# Patient Record
Sex: Female | Born: 1965 | Race: White | Hispanic: No | Marital: Single | Smoking: Former smoker
Health system: Southern US, Community
[De-identification: ages and names within clinical notes are randomized; demographics above are authoritative.]

## PROBLEM LIST (undated history)

## (undated) DIAGNOSIS — E559 Vitamin D deficiency, unspecified: Secondary | ICD-10-CM

## (undated) DIAGNOSIS — E669 Obesity, unspecified: Secondary | ICD-10-CM

## (undated) DIAGNOSIS — D219 Benign neoplasm of connective and other soft tissue, unspecified: Secondary | ICD-10-CM

## (undated) DIAGNOSIS — K219 Gastro-esophageal reflux disease without esophagitis: Secondary | ICD-10-CM

## (undated) DIAGNOSIS — L03115 Cellulitis of right lower limb: Secondary | ICD-10-CM

## (undated) DIAGNOSIS — M1990 Unspecified osteoarthritis, unspecified site: Secondary | ICD-10-CM

## (undated) DIAGNOSIS — T7840XA Allergy, unspecified, initial encounter: Secondary | ICD-10-CM

## (undated) DIAGNOSIS — K760 Fatty (change of) liver, not elsewhere classified: Secondary | ICD-10-CM

## (undated) DIAGNOSIS — T8859XA Other complications of anesthesia, initial encounter: Secondary | ICD-10-CM

## (undated) HISTORY — DX: Allergy, unspecified, initial encounter: T78.40XA

## (undated) HISTORY — PX: TOTAL ABDOMINAL HYSTERECTOMY: SHX209

## (undated) HISTORY — DX: Unspecified osteoarthritis, unspecified site: M19.90

## (undated) HISTORY — PX: ABDOMINAL HYSTERECTOMY: SHX81

## (undated) HISTORY — PX: NEUROPLASTY / TRANSPOSITION ULNAR NERVE AT ELBOW: SUR895

## (undated) HISTORY — DX: Cellulitis of right lower limb: L03.115

## (undated) HISTORY — DX: Fatty (change of) liver, not elsewhere classified: K76.0

## (undated) HISTORY — DX: Benign neoplasm of connective and other soft tissue, unspecified: D21.9

---
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
## 2007-08-11 ENCOUNTER — Ambulatory Visit (HOSPITAL_COMMUNITY): Admission: RE | Admit: 2007-08-11 | Discharge: 2007-08-11 | Payer: Self-pay | Admitting: Family Medicine

## 2007-08-12 ENCOUNTER — Emergency Department (HOSPITAL_COMMUNITY): Admission: EM | Admit: 2007-08-12 | Discharge: 2007-08-12 | Payer: Self-pay | Admitting: Emergency Medicine

## 2008-10-05 IMAGING — US US ABDOMEN COMPLETE
1 series · 13 of 25 positions shown · non-contrast
Comparison: none

CLINICAL DATA: Abdominal mass.  Abdominal swelling.  
ABDOMEN ULTRASOUND:
TECHNIQUE: Complete abdominal ultrasound examination was performed including evaluation of the liver, gallbladder, bile ducts, pancreas, kidneys, spleen, IVC, and abdominal aorta.
TECHNIQUE: Both transabdominal and transvaginal ultrasound examinations of the pelvis were performed including evaluation of the uterus, ovaries, adnexal regions, and pelvic cul-de-sac.

[Series 1: unknown · 0.40mm/px · 13 of 68 slices shown]
[im 1/68]
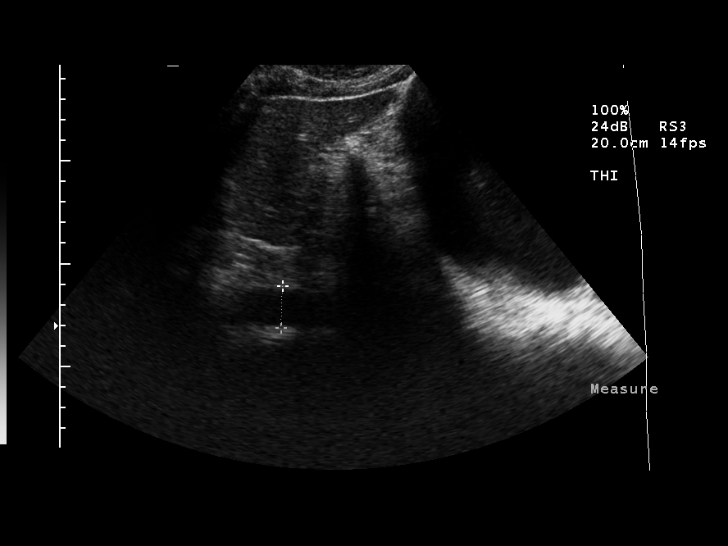
[im 6/68]
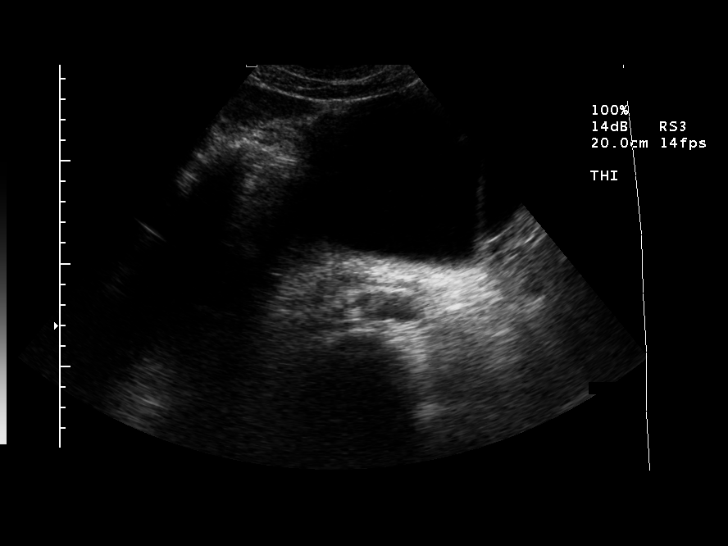
[im 12/68]
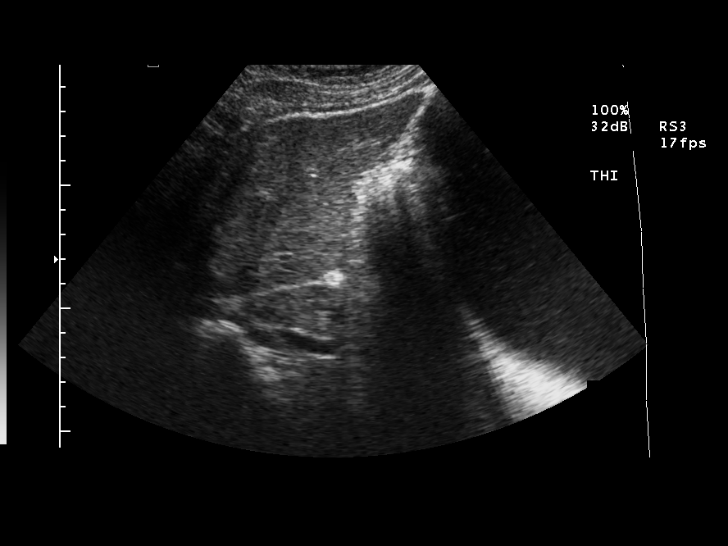
[im 17/68]
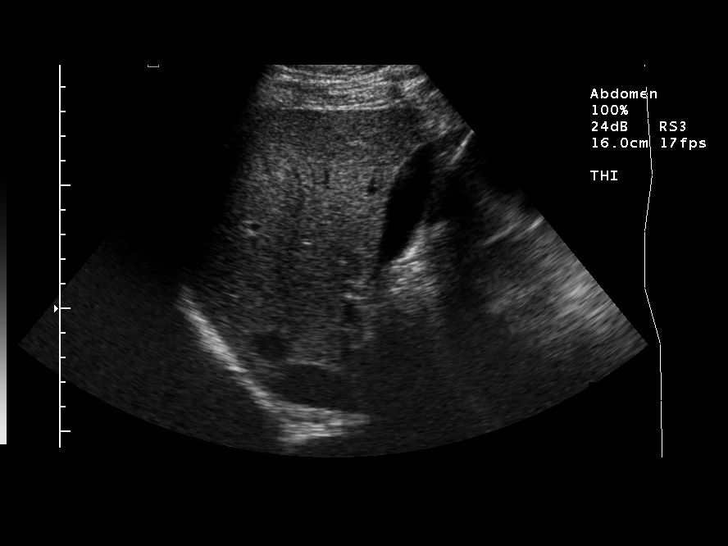
[im 23/68]
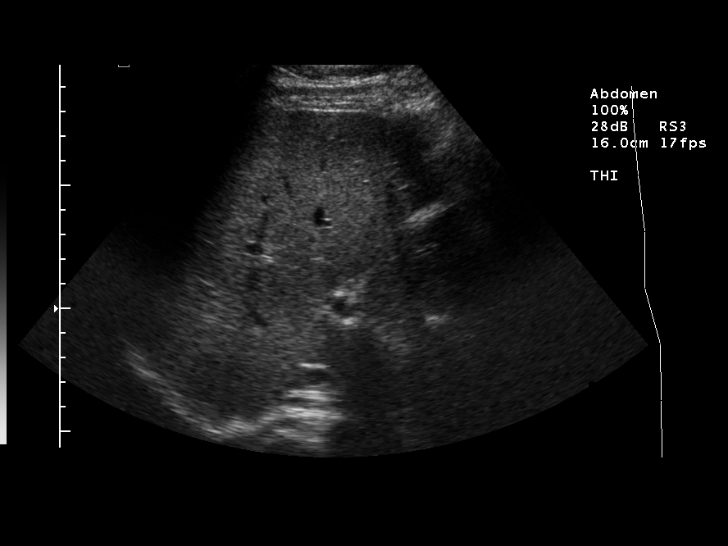
[im 28/68]
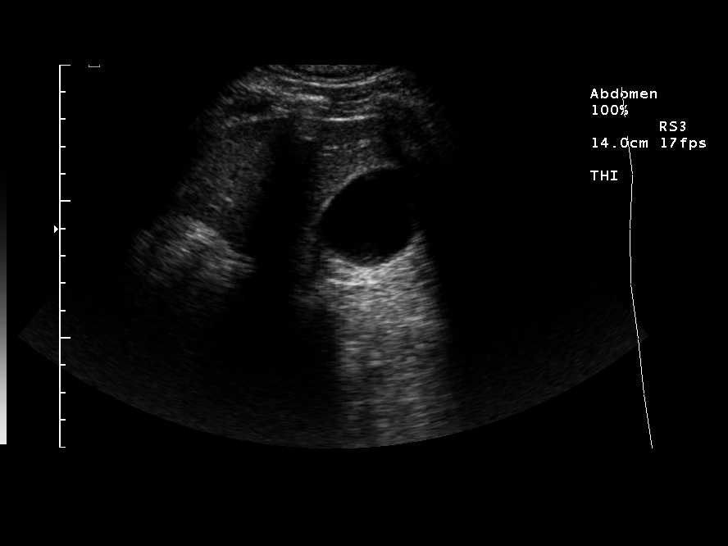
[im 34/68]
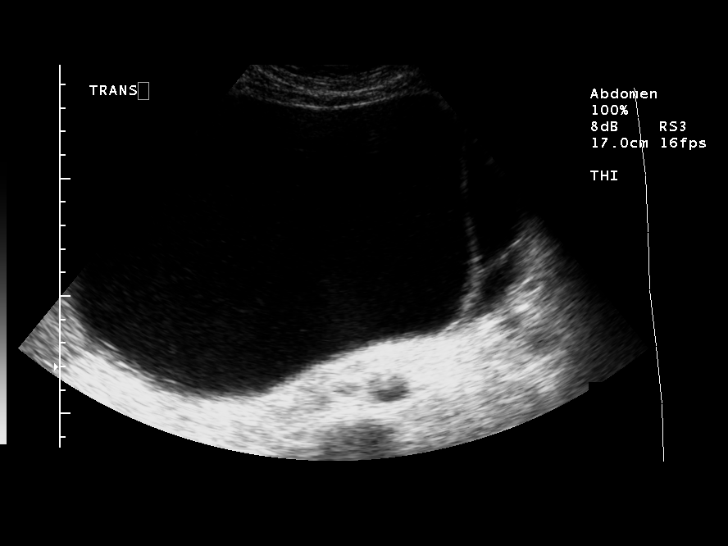
[im 40/68]
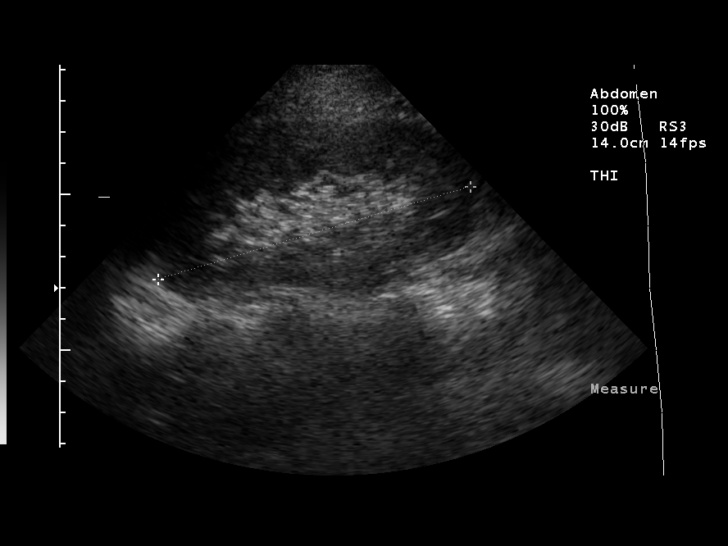
[im 45/68]
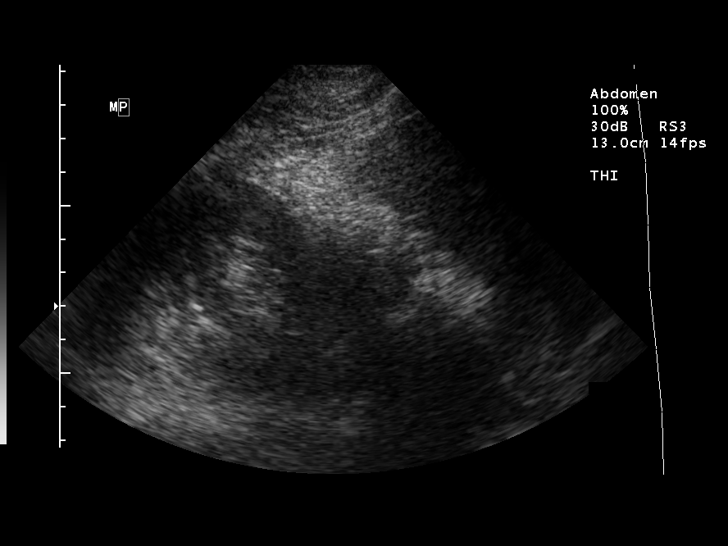
[im 51/68]
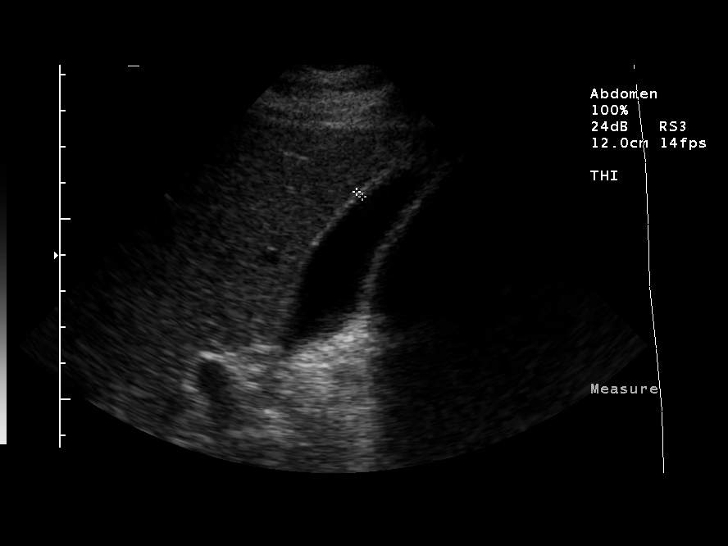
[im 56/68]
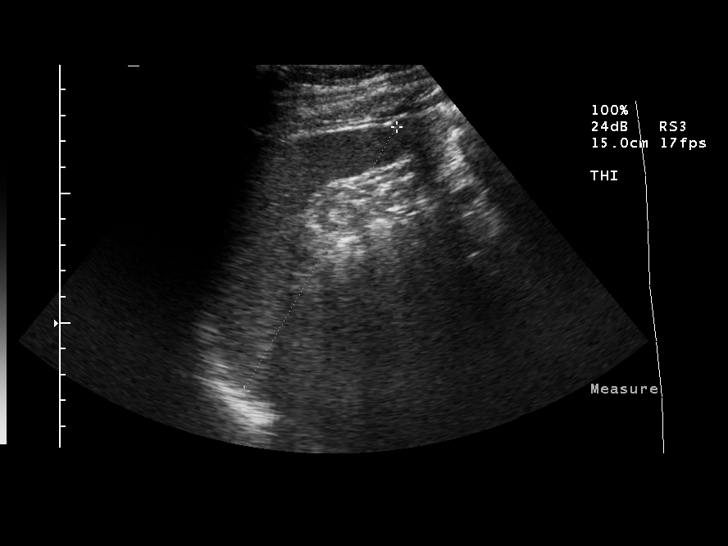
[im 62/68]
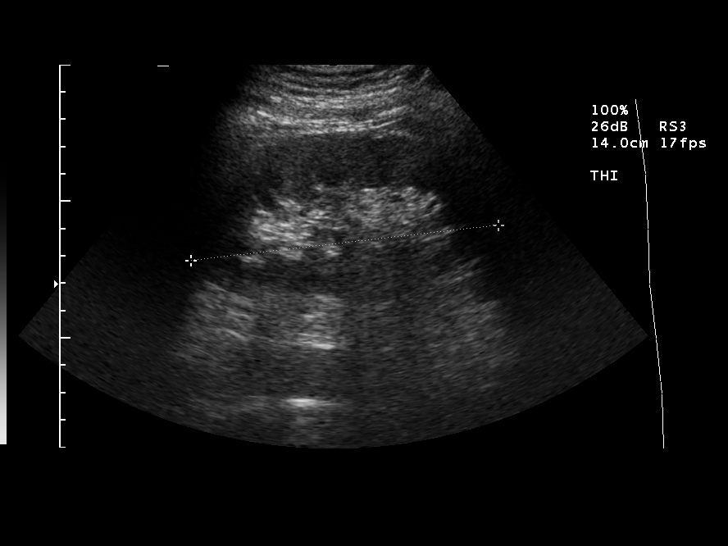
[im 68/68]
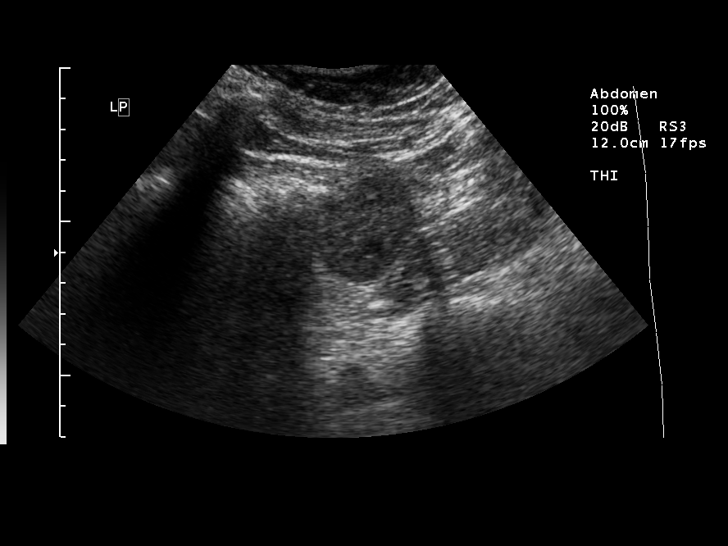

[13 of 25 positions shown; findings below may reference images not displayed]

FINDINGS: Normal gallbladder.  Gallbladder wall thickness is 2.1 mm.  No biliary ductal dilatation.  Common duct measures 3.1 mm.  No hepatic abnormality.  Pancreas could not be visualized because of adjacent bowel gas.  Patent IVC.  Abdominal aortic aneurysm maximum diameter is 2.0 cm.  Right and left kidneys measure 10.4 cm and 11.3 cm in length, respectively.  No hydronephrosis.  Spleen unremarkable.  Spleen measures 10.9 cm in length.  The superior aspect of a large abdominopelvic mass is appreciated.  It appears complex, but predominately cystic.  Septations are appreciated.  No abdominal ascites.
IMPRESSION: Large abdominopelvic mass of undetermined etiology.  Malignancy, for example, due to ovarian carcinoma cannot be excluded at this point.  The physician will be contacted concerning the desire to ultrasound the pelvis for further assessment.  
TRANSABDOMINAL AND TRANSVAGINAL PELVIC ULTRASOUND:
FINDINGS: Uterus measures 11 cm in length and 5.3 x 5.6 cm in transverse dimensions.  Endometrial stripe complex measures 1.2 cm in thickness. There are a couple of small uterine fibroids.  Left ovary measures 1.9 x 2.8 x 3.5 cm.  The right ovary cannot be identified as a separate structure.  There is a large complex appearing pelvic mass which extends up into the abdomen.  There may be some solid elements posteriorly.  Septations are appreciated.  The mass is   predominately cystic.  It measures 27 cm in superior to inferior dimension and approximately 13 cm x 25 cm in transverse dimensions.  No free pelvic ascites is detected.
IMPRESSION: Large complex pelvic mass extending superiorly up into the abdomen approximately to the level of the liver and pancreas.  I feel that a right ovarian mass would be a leading consideration, potentially ovarian cystadenocarcinoma or large cystadenoma.

## 2009-07-08 ENCOUNTER — Emergency Department (HOSPITAL_COMMUNITY): Admission: EM | Admit: 2009-07-08 | Discharge: 2009-07-08 | Payer: Self-pay | Admitting: Family Medicine

## 2009-12-14 ENCOUNTER — Emergency Department (HOSPITAL_COMMUNITY): Admission: EM | Admit: 2009-12-14 | Discharge: 2009-12-14 | Payer: Self-pay | Admitting: Family Medicine

## 2009-12-15 ENCOUNTER — Ambulatory Visit: Payer: Self-pay | Admitting: Internal Medicine

## 2009-12-15 ENCOUNTER — Inpatient Hospital Stay (HOSPITAL_COMMUNITY): Admission: EM | Admit: 2009-12-15 | Discharge: 2009-12-19 | Payer: Self-pay | Admitting: Emergency Medicine

## 2010-01-03 ENCOUNTER — Ambulatory Visit: Payer: Self-pay | Admitting: Internal Medicine

## 2010-01-03 ENCOUNTER — Encounter (HOSPITAL_BASED_OUTPATIENT_CLINIC_OR_DEPARTMENT_OTHER): Admission: RE | Admit: 2010-01-03 | Discharge: 2010-04-03 | Payer: Self-pay | Admitting: General Surgery

## 2010-01-03 DIAGNOSIS — L02419 Cutaneous abscess of limb, unspecified: Secondary | ICD-10-CM | POA: Insufficient documentation

## 2010-01-03 DIAGNOSIS — D259 Leiomyoma of uterus, unspecified: Secondary | ICD-10-CM | POA: Insufficient documentation

## 2010-01-03 DIAGNOSIS — L03119 Cellulitis of unspecified part of limb: Secondary | ICD-10-CM

## 2010-01-04 ENCOUNTER — Encounter: Payer: Self-pay | Admitting: Internal Medicine

## 2010-01-05 ENCOUNTER — Ambulatory Visit (HOSPITAL_COMMUNITY): Admission: RE | Admit: 2010-01-05 | Discharge: 2010-01-05 | Payer: Self-pay | Admitting: Internal Medicine

## 2010-01-05 ENCOUNTER — Ambulatory Visit: Payer: Self-pay | Admitting: Vascular Surgery

## 2010-01-05 ENCOUNTER — Encounter: Payer: Self-pay | Admitting: Internal Medicine

## 2010-07-31 ENCOUNTER — Emergency Department (HOSPITAL_COMMUNITY)
Admission: EM | Admit: 2010-07-31 | Discharge: 2010-07-31 | Payer: Self-pay | Source: Home / Self Care | Admitting: Family Medicine

## 2010-12-12 NOTE — Miscellaneous (Signed)
Summary: HIPAA Restrictions  HIPAA Restrictions   Imported By: Florinda Marker 01/04/2010 15:33:57  _____________________________________________________________________  External Attachment:    Type:   Image     Comment:   External Document

## 2010-12-12 NOTE — Assessment & Plan Note (Signed)
Summary: NEW HFU-PER DR [facility]ILLS/[facility]   Vital Signs:  Patient profile:   45 year old female Height:      65 inches (165.10 cm) Weight:      227.3 pounds (103.32 kg) BMI:     37.96 Temp:     98.3 degrees F Pulse rate:   69 / minute BP sitting:   115 / 73  (right arm)  Vitals Entered By: Dorie Rank RN (January 03, 2010 2:49 PM) CC: HFU - right lower leg cracked, reddened, scabbed, - states was told to finish antibiotic and just wash leg twice a day with soap and water - skin very dry and pt states right ankle swollen - states "leg feels tight" Is Patient Diabetic? No Pain Assessment Patient in pain? yes     Location: right lower leg Intensity: 0 - sitting Type: burning Onset of pain  standing and walking increases burning sensation - Feb. 1, started fevers Nutritional Status BMI of > 30 = obese  Have you ever been in a relationship where you felt threatened, hurt or afraid?Unable to ask  Domestic Violence Intervention friend  in room  Does patient need assistance? Functional Status Self care Ambulation Normal Comments walking with steady gait but slightly favoring right leg - finished antibiotic on Thursday   CC:  HFU - right lower leg cracked, reddened, scabbed, and - states was told to finish antibiotic and just wash leg twice a day with soap and water - skin very dry and pt states right ankle swollen - states "leg feels tight".  History of Present Illness: 45 yo f with no significant PMH presented to [facility] ED on 12/14/2009 with fever and LE cellulitis, she was admitted and given IV vanc. for 5 days and was sent home on with script for 10days of ds bactrim, today on f/u she does not complain of any fever or chills, however she does complain that her R leg is still swolen and that the wound is crusting over and falling off, today her R.leg is red, swolen and with diffuse patchy black crusted areas.   Preventive Screening-Counseling & Management  Alcohol-Tobacco     Smoking  Status: quit     Year Quit: 2008  Current Medications (verified): 1)  Estradiol 0.1 Mg/24hr Ptwk (Estradiol) .... Apply To Skin Weekly  Social History: Smoking Status:  quit  Review of Systems       per HPI  Physical Exam  General:  alert, well-developed, and cooperative to examination.    Lungs:  normal respiratory effort, no accessory muscle use, normal breath sounds, no crackles, and no wheezes.  Heart:  normal rate, regular rhythm, no murmur, no gallop, and no rub.    Abdomen:  soft, non-tender, normal bowel sounds, no distention, no guarding, no rebound tenderness, no hepatomegaly, and no splenomegaly.    Msk:  no joint swelling, no joint warmth, and no redness over joints.    Extremities:  R leg residual cellulitis, with black patchy crusting and erythema.  Neurologic:  alert & oriented X3, cranial nerves II-XII intact, strength normal in all extremities, sensation intact to light touch, and gait normal.       Impression & Recommendations:  Problem # 1:  CELLULITIS, LEG, RIGHT (ICD-682.6) remains not fully healed despite adequate antibiotic treatment, since patient is afebrile and completed abx course, there is no need for further abx..A referral to wound care made for further recs. and will obtain LE dopplers to assess for blood flow to region.Marland Kitchen  Orders: Wound Care Center Referral (Wound Care) LE Arterial Doppler/ABI (LE arterial doppler) LE Venous Duplex (DVT) (DVT)  Problem # 2:  FIBROIDS, UTERUS (ICD-218.9) per OBGYN, patient is s/p hysterectomy on estradiol patch  Complete Medication List: 1)  Estradiol 0.1 Mg/24hr Ptwk (Estradiol) .... Apply to skin weekly  Patient Instructions: 1)  Please schedule a follow-up appointment in 1 month. Prescriptions: ESTRADIOL 0.1 MG/24HR PTWK (ESTRADIOL) apply to skin weekly  #4 x 0   Entered and Authorized by:   Darnelle Maffucci MD   Signed by:   Darnelle Maffucci MD on 01/03/2010   Method used:   Print then Give to Patient    RxID:   [id number]

## 2011-01-31 LAB — BASIC METABOLIC PANEL
BUN: 6 mg/dL (ref 6–23)
BUN: 9 mg/dL (ref 6–23)
CO2: 27 mEq/L (ref 19–32)
Calcium: 8.1 mg/dL — ABNORMAL LOW (ref 8.4–10.5)
Calcium: 8.4 mg/dL (ref 8.4–10.5)
Chloride: 104 mEq/L (ref 96–112)
Chloride: 106 mEq/L (ref 96–112)
Creatinine, Ser: 0.7 mg/dL (ref 0.4–1.2)
Creatinine, Ser: 0.72 mg/dL (ref 0.4–1.2)
Creatinine, Ser: 0.78 mg/dL (ref 0.4–1.2)
GFR calc Af Amer: >60 mL/min (ref >60)
GFR calc Af Amer: >60 mL/min (ref >60)
GFR calc non Af Amer: >60 mL/min (ref >60)
Potassium: 3.3 mEq/L — ABNORMAL LOW (ref 3.5–5.1)

## 2011-01-31 LAB — COMPREHENSIVE METABOLIC PANEL
BUN: 10 mg/dL (ref 6–23)
CO2: 22 mEq/L (ref 19–32)
Calcium: 7.7 mg/dL — ABNORMAL LOW (ref 8.4–10.5)
Creatinine, Ser: 0.8 mg/dL (ref 0.4–1.2)
GFR calc non Af Amer: >60 mL/min (ref >60)
Glucose, Bld: 133 mg/dL — ABNORMAL HIGH (ref 70–99)
Sodium: 133 mEq/L — ABNORMAL LOW (ref 135–145)
Total Protein: 6.5 g/dL (ref 6.0–8.3)

## 2011-01-31 LAB — CULTURE, BLOOD (ROUTINE X 2)
Culture: NO GROWTH
Culture: NO GROWTH

## 2011-01-31 LAB — DIFFERENTIAL
Basophils Absolute: 0 10*3/uL (ref 0.0–0.1)
Basophils Absolute: 0.1 10*3/uL (ref 0.0–0.1)
Basophils Relative: 0 % (ref 0–1)
Basophils Relative: 1 % (ref 0–1)
Eosinophils Relative: 0 % (ref 0–5)
Eosinophils Relative: 0 % (ref 0–5)
Lymphocytes Relative: 5 % — ABNORMAL LOW (ref 12–46)
Lymphocytes Relative: 7 % — ABNORMAL LOW (ref 12–46)
Lymphs Abs: 1 10*3/uL (ref 0.7–4.0)
Monocytes Absolute: 0.2 10*3/uL (ref 0.1–1.0)
Monocytes Absolute: 0.7 10*3/uL (ref 0.1–1.0)
Monocytes Relative: 1 % — ABNORMAL LOW (ref 3–12)
Neutrophils Relative %: 92 % — ABNORMAL HIGH (ref 43–77)
Neutrophils Relative %: 93 % — ABNORMAL HIGH (ref 43–77)
WBC Morphology: INCREASED

## 2011-01-31 LAB — CBC
HCT: 33.2 % — ABNORMAL LOW (ref 36.0–46.0)
HCT: 34.7 % — ABNORMAL LOW (ref 36.0–46.0)
Hemoglobin: 11.8 g/dL — ABNORMAL LOW (ref 12.0–15.0)
Hemoglobin: 12.1 g/dL (ref 12.0–15.0)
Hemoglobin: 12.3 g/dL (ref 12.0–15.0)
MCHC: 34.5 g/dL (ref 30.0–36.0)
MCHC: 34.8 g/dL (ref 30.0–36.0)
MCHC: 35 g/dL (ref 30.0–36.0)
MCHC: 35.5 g/dL (ref 30.0–36.0)
MCV: 94.6 fL (ref 78.0–100.0)
MCV: 95.1 fL (ref 78.0–100.0)
MCV: 96.6 fL (ref 78.0–100.0)
Platelets: 155 10*3/uL (ref 150–400)
Platelets: 180 10*3/uL (ref 150–400)
Platelets: 180 10*3/uL (ref 150–400)
Platelets: 203 10*3/uL (ref 150–400)
RBC: 3.51 MIL/uL — ABNORMAL LOW (ref 3.87–5.11)
RBC: 3.54 MIL/uL — ABNORMAL LOW (ref 3.87–5.11)
RBC: 3.7 MIL/uL — ABNORMAL LOW (ref 3.87–5.11)
RDW: 12.8 % (ref 11.5–15.5)
RDW: 13.1 % (ref 11.5–15.5)
RDW: 13.1 % (ref 11.5–15.5)
RDW: 13.2 % (ref 11.5–15.5)
WBC: 10.9 10*3/uL — ABNORMAL HIGH (ref 4.0–10.5)
WBC: 10.9 10*3/uL — ABNORMAL HIGH (ref 4.0–10.5)
WBC: 16.9 10*3/uL — ABNORMAL HIGH (ref 4.0–10.5)
WBC: 21.5 10*3/uL — ABNORMAL HIGH (ref 4.0–10.5)

## 2011-01-31 LAB — RAPID URINE DRUG SCREEN, HOSP PERFORMED
Amphetamines: NOT DETECTED
Barbiturates: NOT DETECTED
Benzodiazepines: NOT DETECTED
Opiates: NOT DETECTED

## 2011-01-31 LAB — URINALYSIS, ROUTINE W REFLEX MICROSCOPIC
Bilirubin Urine: NEGATIVE
Glucose, UA: NEGATIVE mg/dL
Protein, ur: NEGATIVE mg/dL
Specific Gravity, Urine: 1.007 (ref 1.005–1.030)
Urobilinogen, UA: 1 mg/dL (ref 0.0–1.0)

## 2011-01-31 LAB — WOUND CULTURE
Culture: NO GROWTH
Gram Stain: NONE SEEN

## 2011-01-31 LAB — STREP PNEUMONIAE URINARY ANTIGEN: Strep Pneumo Urinary Antigen: NEGATIVE

## 2011-01-31 LAB — HEMOGLOBIN A1C
Hgb A1c MFr Bld: 5.5 % (ref 4.6–6.1)
Mean Plasma Glucose: 111 mg/dL

## 2011-01-31 LAB — URINE MICROSCOPIC-ADD ON

## 2011-02-08 ENCOUNTER — Encounter: Payer: Self-pay | Admitting: Internal Medicine

## 2011-08-23 LAB — COMPREHENSIVE METABOLIC PANEL
AST: 23
Albumin: 4.3
Alkaline Phosphatase: 43
BUN: 3 — ABNORMAL LOW
CO2: 27
Chloride: 106
GFR calc non Af Amer: >60
Potassium: 3.5
Total Bilirubin: 0.8

## 2011-08-23 LAB — URINALYSIS, ROUTINE W REFLEX MICROSCOPIC
Bilirubin Urine: NEGATIVE
Glucose, UA: NEGATIVE
Ketones, ur: 15 — AB
Leukocytes, UA: NEGATIVE
Protein, ur: NEGATIVE
pH: 6.5

## 2011-08-23 LAB — DIFFERENTIAL
Basophils Absolute: 0
Basophils Relative: 0
Eosinophils Relative: 1
Monocytes Absolute: 0.5
Neutro Abs: 5.1

## 2011-08-23 LAB — CBC
HCT: 40.7
MCV: 94.8
Platelets: 261
RBC: 4.29
WBC: 7.8

## 2011-08-23 LAB — URINE MICROSCOPIC-ADD ON

## 2011-08-23 LAB — PREGNANCY, URINE: Preg Test, Ur: NEGATIVE

## 2012-12-27 ENCOUNTER — Other Ambulatory Visit: Payer: Self-pay

## 2013-07-27 ENCOUNTER — Other Ambulatory Visit: Payer: Self-pay | Admitting: Obstetrics and Gynecology

## 2013-07-27 DIAGNOSIS — R928 Other abnormal and inconclusive findings on diagnostic imaging of breast: Secondary | ICD-10-CM

## 2013-08-11 ENCOUNTER — Ambulatory Visit
Admission: RE | Admit: 2013-08-11 | Discharge: 2013-08-11 | Disposition: A | Discharge status: Home / Self Care | Payer: 59 | Source: Ambulatory Visit | Attending: Obstetrics and Gynecology | Admitting: Obstetrics and Gynecology

## 2013-08-11 DIAGNOSIS — R928 Other abnormal and inconclusive findings on diagnostic imaging of breast: Secondary | ICD-10-CM

## 2013-09-17 ENCOUNTER — Other Ambulatory Visit: Payer: Self-pay

## 2014-04-16 ENCOUNTER — Other Ambulatory Visit: Payer: Self-pay

## 2014-04-16 DIAGNOSIS — Z1231 Encounter for screening mammogram for malignant neoplasm of breast: Secondary | ICD-10-CM

## 2014-07-27 ENCOUNTER — Ambulatory Visit: Payer: 59

## 2014-08-05 ENCOUNTER — Ambulatory Visit
Admission: RE | Admit: 2014-08-05 | Discharge: 2014-08-05 | Disposition: A | Discharge status: Home / Self Care | Payer: 59 | Source: Ambulatory Visit

## 2014-08-05 DIAGNOSIS — Z1231 Encounter for screening mammogram for malignant neoplasm of breast: Secondary | ICD-10-CM

## 2014-09-30 ENCOUNTER — Emergency Department (HOSPITAL_COMMUNITY)
Admission: EM | Admit: 2014-09-30 | Discharge: 2014-09-30 | Disposition: A | Discharge status: Home / Self Care | Payer: 59 | Source: Home / Self Care | Attending: Family Medicine | Admitting: Family Medicine

## 2014-09-30 ENCOUNTER — Encounter (HOSPITAL_COMMUNITY): Payer: Self-pay | Admitting: *Deleted

## 2014-09-30 DIAGNOSIS — M25561 Pain in right knee: Secondary | ICD-10-CM

## 2014-09-30 DIAGNOSIS — M1711 Unilateral primary osteoarthritis, right knee: Secondary | ICD-10-CM

## 2014-09-30 DIAGNOSIS — M129 Arthropathy, unspecified: Secondary | ICD-10-CM

## 2014-09-30 MED ORDER — METHYLPREDNISOLONE ACETATE 80 MG/ML IJ SUSP
INTRAMUSCULAR | Status: AC
  Filled 2014-09-30: qty 1

## 2014-09-30 MED ORDER — KETOROLAC TROMETHAMINE 60 MG/2ML IM SOLN
60.0000 mg | Freq: Once | INTRAMUSCULAR | Status: AC
  Administered 2014-09-30: 60 mg via INTRAMUSCULAR

## 2014-09-30 MED ORDER — KETOROLAC TROMETHAMINE 60 MG/2ML IM SOLN
INTRAMUSCULAR | Status: AC
  Filled 2014-09-30: qty 2

## 2014-09-30 MED ORDER — METHYLPREDNISOLONE ACETATE PF 80 MG/ML IJ SUSP
80.0000 mg | Freq: Once | INTRAMUSCULAR | Status: AC
  Administered 2014-09-30: 80 mg via INTRAMUSCULAR

## 2014-09-30 MED ORDER — MELOXICAM 15 MG PO TABS: 15.0000 mg | ORAL_TABLET | Freq: Every day | ORAL | Status: DC

## 2014-09-30 NOTE — ED Provider Notes (Signed)
CSN: [id number]     Arrival date & time 09/30/14  1206 History   First MD Initiated Contact with Patient 09/30/14 1245     Chief Complaint  Patient presents with  . Knee Pain   (Consider location/radiation/quality/duration/timing/severity/associated sxs/prior Treatment) HPI         48 year old female presents for evaluation of right knee pain. This pain initially began about one month ago, the lateral portion of the knee. She denies any specific injury but says it started hurting more she was doing training at work on a hard concrete floor. Since then the pain has moved to the medial portion of the knee. The pain is dull, throbbing. It is nonradiating. She feels like there is some mild swelling in the front of the knee. She denies any previous history of knee pain or arthritis. She states that she feels like there is inflammation in her knee and she just needs a brace. She has been taking ibuprofen as needed for this pain which is helpful  Past Medical History  Diagnosis Date  . Fibroids     uterine  . Cellulitis of leg, right    Past Surgical History  Procedure Laterality Date  . Abdominal hysterectomy     History reviewed. No pertinent family history. History  Substance Use Topics  . Smoking status: Former Smoker    Types: Cigarettes    Quit date: 11/12/2006  . Smokeless tobacco: Not on file  . Alcohol Use: No   OB History    No data available     Review of Systems  Musculoskeletal: Positive for arthralgias (knee pain).  All other systems reviewed and are negative.   Allergies  Review of patient's allergies indicates no known allergies.  Home Medications   Prior to Admission medications   Medication Sig Start Date End Date Taking? Authorizing Provider  estradiol (CLIMARA - DOSED IN MG/24 HR) 0.1 mg/24hr Place 1 patch onto the skin once a week.      Historical Provider, MD  meloxicam (MOBIC) 15 MG tablet Take 1 tablet (15 mg total) by mouth daily. 09/30/14   Liam Graham, PA-C   BP 112/73 mmHg  Pulse 76  Temp(Src) 98.5 F (36.9 C) (Oral)  Resp 16  SpO2 98% Physical Exam  Constitutional: She is oriented to person, place, and time. Vital signs are normal. She appears well-developed and well-nourished. No distress.  HENT:  Head: Normocephalic and atraumatic.  Pulmonary/Chest: Effort normal. No respiratory distress.  Musculoskeletal:       Right knee: She exhibits normal range of motion, no swelling, no effusion, no deformity, normal alignment, no LCL laxity, normal patellar mobility, no bony tenderness, normal meniscus and no MCL laxity. Tenderness found. Medial joint line tenderness noted. No lateral joint line, no MCL, no LCL and no patellar tendon tenderness noted.  Neurological: She is alert and oriented to person, place, and time. She has normal strength and normal reflexes. No sensory deficit. She exhibits normal muscle tone. Coordination and gait normal. GCS eye subscore is 4. GCS verbal subscore is 5. GCS motor subscore is 6.  Skin: Skin is warm and dry. No rash noted. She is not diaphoretic.  Psychiatric: She has a normal mood and affect. Judgment normal.  Nursing note and vitals reviewed.   ED Course  Procedures (including critical care time) Labs Review Labs Reviewed - No data to display  Imaging Review No results found.   MDM   1. Knee pain, right   2. Arthritis  of knee, right    Given toradol and depomedrol here.  Discharge with daily mobic.  Follow up with ortho if no improvement.     Meds ordered this encounter  Medications  . methylPREDNISolone acetate PF (DEPO-MEDROL) injection 80 mg    Sig:   . ketorolac (TORADOL) injection 60 mg    Sig:   . meloxicam (MOBIC) 15 MG tablet    Sig: Take 1 tablet (15 mg total) by mouth daily.    Dispense:  30 tablet    Refill:  2    Order Specific Question:  Supervising Provider    Answer:  Ihor Gully D [place] Tahirah Sara, PA-C 09/30/14 1314

## 2014-09-30 NOTE — Discharge Instructions (Signed)
Arthritis, Nonspecific °Arthritis is pain, redness, warmth, or puffiness (inflammation) of a joint. The joint may be stiff or hurt when you move it. One or more joints may be affected. There are many types of arthritis. Your doctor may not know what type you have right away. The most common cause of arthritis is wear and tear on the joint (osteoarthritis). °HOME CARE  °· Only take medicine as told by your doctor. °· Rest the joint as much as possible. °· Raise (elevate) your joint if it is puffy. °· Use crutches if the painful joint is in your leg. °· Drink enough fluids to keep your pee (urine) clear or pale yellow. °· Follow your doctor's diet instructions. °· Use cold packs for very bad joint pain for 10 to 15 minutes every hour. Ask your doctor if it is okay for you to use hot packs. °· Exercise as told by your doctor. °· Take a warm shower if you have stiffness in the morning. °· Move your sore joints throughout the day. °GET HELP RIGHT AWAY IF:  °· You have a fever. °· You have very bad joint pain, puffiness, or redness. °· You have many joints that are painful and puffy. °· You are not getting better with treatment. °· You have very bad back pain or leg weakness. °· You cannot control when you poop (bowel movement) or pee (urinate). °· You do not feel better in 24 hours or are getting worse. °· You are having side effects from your medicine. °MAKE SURE YOU:  °· Understand these instructions. °· Will watch your condition. °· Will get help right away if you are not doing well or get worse. °Document Released: 01/23/2010 Document Revised: 04/29/2012 Document Reviewed: 01/23/2010 °ExitCare® Patient Information ©2015 [employer]. This information is not intended to replace advice given to you by your health care provider. Make sure you discuss any questions you have with your health care provider. ° °Knee Pain °The knee is the complex joint between your thigh and your lower leg. It is made up of bones, tendons,  ligaments, and cartilage. The bones that make up the knee are: °· The femur in the thigh. °· The tibia and fibula in the lower leg. °· The patella or kneecap riding in the groove on the lower femur. °CAUSES  °Knee pain is a common complaint with many causes. A few of these causes are: °· Injury, such as: °¨ A ruptured ligament or tendon injury. °¨ Torn cartilage. °· Medical conditions, such as: °¨ Gout °¨ Arthritis °¨ Infections °· Overuse, over training, or overdoing a physical activity. °Knee pain can be minor or severe. Knee pain can accompany debilitating injury. Minor knee problems often respond well to self-care measures or get well on their own. More serious injuries may need medical intervention or even surgery. °SYMPTOMS °The knee is complex. Symptoms of knee problems can vary widely. Some of the problems are: °· Pain with movement and weight bearing. °· Swelling and tenderness. °· Buckling of the knee. °· Inability to straighten or extend your knee. °· Your knee locks and you cannot straighten it. °· Warmth and redness with pain and fever. °· Deformity or dislocation of the kneecap. °DIAGNOSIS  °Determining what is wrong may be very straight forward such as when there is an injury. It can also be challenging because of the complexity of the knee. Tests to make a diagnosis may include: °· Your caregiver taking a history and doing a physical exam. °· Routine X-rays   can be used to rule out other problems. X-rays will not reveal a cartilage tear. Some injuries of the knee can be diagnosed by: °¨ Arthroscopy a surgical technique by which a small video camera is inserted through tiny incisions on the sides of the knee. This procedure is used to examine and repair internal knee joint problems. Tiny instruments can be used during arthroscopy to repair the torn knee cartilage (meniscus). °¨ Arthrography is a radiology technique. A contrast liquid is directly injected into the knee joint. Internal structures of the  knee joint then become visible on X-ray film. °¨ An MRI scan is a non X-ray radiology procedure in which magnetic fields and a computer produce two- or three-dimensional images of the inside of the knee. Cartilage tears are often visible using an MRI scanner. MRI scans have largely replaced arthrography in diagnosing cartilage tears of the knee. °· Blood work. °· Examination of the fluid that helps to lubricate the knee joint (synovial fluid). This is done by taking a sample out using a needle and a syringe. °TREATMENT °The treatment of knee problems depends on the cause. Some of these treatments are: °· Depending on the injury, proper casting, splinting, surgery, or physical therapy care will be needed. °· Give yourself adequate recovery time. Do not overuse your joints. If you begin to get sore during workout routines, back off. Slow down or do fewer repetitions. °· For repetitive activities such as cycling or running, maintain your strength and nutrition. °· Alternate muscle groups. For example, if you are a weight lifter, work the upper body on one day and the lower body the next. °· Either tight or weak muscles do not give the proper support for your knee. Tight or weak muscles do not absorb the stress placed on the knee joint. Keep the muscles surrounding the knee strong. °· Take care of mechanical problems. °¨ If you have flat feet, orthotics or special shoes may help. See your caregiver if you need help. °¨ Arch supports, sometimes with wedges on the inner or outer aspect of the heel, can help. These can shift pressure away from the side of the knee most bothered by osteoarthritis. °¨ A brace called an "unloader" brace also may be used to help ease the pressure on the most arthritic side of the knee. °· If your caregiver has prescribed crutches, braces, wraps or ice, use as directed. The acronym for this is PRICE. This means protection, rest, ice, compression, and elevation. °· Nonsteroidal anti-inflammatory  drugs (NSAIDs), can help relieve pain. But if taken immediately after an injury, they may actually increase swelling. Take NSAIDs with food in your stomach. Stop them if you develop stomach problems. Do not take these if you have a history of ulcers, stomach pain, or bleeding from the bowel. Do not take without your caregiver's approval if you have problems with fluid retention, heart failure, or kidney problems. °· For ongoing knee problems, physical therapy may be helpful. °· Glucosamine and chondroitin are over-the-counter dietary supplements. Both may help relieve the pain of osteoarthritis in the knee. These medicines are different from the usual anti-inflammatory drugs. Glucosamine may decrease the rate of cartilage destruction. °· Injections of a corticosteroid drug into your knee joint may help reduce the symptoms of an arthritis flare-up. They may provide pain relief that lasts a few months. You may have to wait a few months between injections. The injections do have a small increased risk of infection, water retention, and elevated blood sugar levels. °·   Hyaluronic acid injected into damaged joints may ease pain and provide lubrication. These injections may work by reducing inflammation. A series of shots may give relief for as long as 6 months. °· Topical painkillers. Applying certain ointments to your skin may help relieve the pain and stiffness of osteoarthritis. Ask your pharmacist for suggestions. Many over the-counter products are approved for temporary relief of arthritis pain. °· In some countries, doctors often prescribe topical NSAIDs for relief of chronic conditions such as arthritis and tendinitis. A review of treatment with NSAID creams found that they worked as well as oral medications but without the serious side effects. °PREVENTION °· Maintain a healthy weight. Extra pounds put more strain on your joints. °· Get strong, stay limber. Weak muscles are a common cause of knee injuries.  Stretching is important. Include flexibility exercises in your workouts. °· Be smart about exercise. If you have osteoarthritis, chronic knee pain or recurring injuries, you may need to change the way you exercise. This does not mean you have to stop being active. If your knees ache after jogging or playing basketball, consider switching to swimming, water aerobics, or other low-impact activities, at least for a few days a week. Sometimes limiting high-impact activities will provide relief. °· Make sure your shoes fit well. Choose footwear that is right for your sport. °· Protect your knees. Use the proper gear for knee-sensitive activities. Use kneepads when playing volleyball or laying carpet. Buckle your seat belt every time you drive. Most shattered kneecaps occur in car accidents. °· Rest when you are tired. °SEEK MEDICAL CARE IF:  °You have knee pain that is continual and does not seem to be getting better.  °SEEK IMMEDIATE MEDICAL CARE IF:  °Your knee joint feels hot to the touch and you have a high fever. °MAKE SURE YOU:  °· Understand these instructions. °· Will watch your condition. °· Will get help right away if you are not doing well or get worse. °Document Released: 08/26/2007 Document Revised: 01/21/2012 Document Reviewed: 08/26/2007 °ExitCare® Patient Information ©2015 [employer]. This information is not intended to replace advice given to you by your health care provider. Make sure you discuss any questions you have with your health care provider. ° °

## 2014-09-30 NOTE — ED Notes (Signed)
Pt  Reports   She may  Have  Injured  Her      r knee  About  1 month   Ago   While doing        Practice   excercises       At  Work      She ambulated  To exam  Room      Appearing       In  No  Acute  Distress

## 2015-07-22 ENCOUNTER — Other Ambulatory Visit: Payer: Self-pay

## 2015-07-22 DIAGNOSIS — Z1231 Encounter for screening mammogram for malignant neoplasm of breast: Secondary | ICD-10-CM

## 2015-08-19 ENCOUNTER — Ambulatory Visit: Payer: 59

## 2015-09-14 ENCOUNTER — Ambulatory Visit
Admission: RE | Admit: 2015-09-14 | Discharge: 2015-09-14 | Disposition: A | Discharge status: Home / Self Care | Payer: 59 | Source: Ambulatory Visit

## 2015-09-14 DIAGNOSIS — Z1231 Encounter for screening mammogram for malignant neoplasm of breast: Secondary | ICD-10-CM

## 2015-09-23 ENCOUNTER — Telehealth: Payer: 59 | Admitting: Nurse Practitioner

## 2015-09-23 DIAGNOSIS — J0100 Acute maxillary sinusitis, unspecified: Secondary | ICD-10-CM | POA: Diagnosis not present

## 2015-09-23 MED ORDER — AMOXICILLIN-POT CLAVULANATE 875-125 MG PO TABS: 1.0000 | ORAL_TABLET | Freq: Two times a day (BID) | ORAL | Status: DC

## 2015-09-23 NOTE — Progress Notes (Signed)

## 2015-12-21 MED FILL — FAMOTIDINE 20 MG TABLET: 20 | 90 days supply | Qty: 90 | Fill #1

## 2016-02-13 MED FILL — ESTRADIOL 0.1 MG PATCH: 0.1 | 90 days supply | Qty: 24 | Fill #1

## 2016-03-08 DIAGNOSIS — M79672 Pain in left foot: Secondary | ICD-10-CM | POA: Diagnosis not present

## 2016-03-08 DIAGNOSIS — M722 Plantar fascial fibromatosis: Secondary | ICD-10-CM | POA: Diagnosis not present

## 2016-03-19 MED FILL — FAMOTIDINE 20 MG TABLET: 20 | 90 days supply | Qty: 90 | Fill #2

## 2016-04-06 ENCOUNTER — Telehealth: Payer: 59 | Admitting: Family

## 2016-04-06 DIAGNOSIS — J329 Chronic sinusitis, unspecified: Secondary | ICD-10-CM | POA: Diagnosis not present

## 2016-04-06 DIAGNOSIS — B349 Viral infection, unspecified: Secondary | ICD-10-CM

## 2016-04-06 DIAGNOSIS — B9789 Other viral agents as the cause of diseases classified elsewhere: Secondary | ICD-10-CM

## 2016-04-06 MED ORDER — FLUTICASONE PROPIONATE 50 MCG/ACT NA SUSP: 2.0000 | Freq: Every day | NASAL | Status: DC

## 2016-04-06 NOTE — Progress Notes (Signed)

## 2016-04-10 DIAGNOSIS — R799 Abnormal finding of blood chemistry, unspecified: Secondary | ICD-10-CM | POA: Diagnosis not present

## 2016-05-18 MED FILL — ESTRADIOL 0.1 MG PATCH: 0.1 | 90 days supply | Qty: 24 | Fill #2

## 2016-05-28 DIAGNOSIS — R740 Nonspecific elevation of levels of transaminase and lactic acid dehydrogenase [LDH]: Secondary | ICD-10-CM | POA: Diagnosis not present

## 2016-05-29 ENCOUNTER — Other Ambulatory Visit: Payer: Self-pay | Admitting: Gastroenterology

## 2016-05-29 DIAGNOSIS — R740 Nonspecific elevation of levels of transaminase and lactic acid dehydrogenase [LDH]: Principal | ICD-10-CM

## 2016-05-29 DIAGNOSIS — R7401 Elevation of levels of liver transaminase levels: Secondary | ICD-10-CM

## 2016-06-14 ENCOUNTER — Ambulatory Visit
Admission: RE | Admit: 2016-06-14 | Discharge: 2016-06-14 | Disposition: A | Discharge status: Home / Self Care | Payer: 59 | Source: Ambulatory Visit | Attending: Gastroenterology | Admitting: Gastroenterology

## 2016-06-14 DIAGNOSIS — R740 Nonspecific elevation of levels of transaminase and lactic acid dehydrogenase [LDH]: Principal | ICD-10-CM

## 2016-06-14 DIAGNOSIS — K760 Fatty (change of) liver, not elsewhere classified: Secondary | ICD-10-CM | POA: Diagnosis not present

## 2016-06-14 DIAGNOSIS — R7401 Elevation of levels of liver transaminase levels: Secondary | ICD-10-CM

## 2016-06-26 DIAGNOSIS — H5203 Hypermetropia, bilateral: Secondary | ICD-10-CM | POA: Diagnosis not present

## 2016-06-29 DIAGNOSIS — K760 Fatty (change of) liver, not elsewhere classified: Secondary | ICD-10-CM | POA: Diagnosis not present

## 2016-07-04 ENCOUNTER — Encounter (HOSPITAL_COMMUNITY): Payer: Self-pay | Admitting: Emergency Medicine

## 2016-07-04 ENCOUNTER — Ambulatory Visit (HOSPITAL_COMMUNITY)
Admission: EM | Admit: 2016-07-04 | Discharge: 2016-07-04 | Disposition: A | Discharge status: Home / Self Care | Payer: 59

## 2016-07-04 DIAGNOSIS — M7122 Synovial cyst of popliteal space [Baker], left knee: Secondary | ICD-10-CM | POA: Diagnosis not present

## 2016-07-04 NOTE — Discharge Instructions (Signed)
See orthopedist for further care

## 2016-07-04 NOTE — ED Provider Notes (Signed)
[facility]    CSN: [id number] Arrival date & time: 07/04/16  1106  First Provider Contact:  First MD Initiated Contact with Patient 07/04/16 1209        History   Chief Complaint Chief Complaint  Patient presents with  . Knee Pain    left     HPI Nancy Gilmore is a 50 y.o. female.   The history is provided by the patient.  Knee Pain  Location:  Knee Injury: no   Knee location:  L knee Pain details:    Quality:  Sharp   Radiates to:  Does not radiate   Severity:  Mild   Onset quality:  Gradual   Duration:  2 weeks Chronicity:  Recurrent Dislocation: no   Foreign body present:  No foreign bodies Prior injury to area:  No Relieved by:  None tried Worsened by:  Nothing Ineffective treatments:  None tried Associated symptoms: decreased ROM and swelling   Risk factors: obesity     Past Medical History:  Diagnosis Date  . Cellulitis of leg, right   . Fibroids    uterine    There are no active problems to display for this patient.   Past Surgical History:  Procedure Laterality Date  . ABDOMINAL HYSTERECTOMY      OB History    No data available       Home Medications    Prior to Admission medications   Medication Sig Start Date End Date Taking? Authorizing Provider  amoxicillin-clavulanate (AUGMENTIN) 875-125 MG tablet Take 1 tablet by mouth 2 (two) times daily. 09/23/15   Mary-Margaret Hassell Done, FNP  estradiol (CLIMARA - DOSED IN MG/24 HR) 0.1 mg/24hr Place 1 patch onto the skin once a week.      Historical Provider, MD  fluticasone (FLONASE) 50 MCG/ACT nasal spray Place 2 sprays into both nostrils daily. 04/06/16   Benjamine Mola, FNP  meloxicam (MOBIC) 15 MG tablet Take 1 tablet (15 mg total) by mouth daily. 09/30/14   Liam Graham, PA-C    Family History History reviewed. No pertinent family history.  Social History Social History  Substance Use Topics  . Smoking status: Former Smoker    Types: Cigarettes    Quit date:  11/12/2006  . Smokeless tobacco: Not on file  . Alcohol use No     Allergies   Review of patient's allergies indicates no known allergies.   Review of Systems Review of Systems  Musculoskeletal: Positive for joint swelling.  Skin: Negative.   All other systems reviewed and are negative.    Physical Exam Triage Vital Signs ED Triage Vitals  Enc Vitals Group     BP 07/04/16 1203 125/77     Pulse Rate 07/04/16 1203 60     Resp 07/04/16 1203 18     Temp 07/04/16 1203 98.3 F (36.8 C)     Temp Source 07/04/16 1203 Oral     SpO2 07/04/16 1203 97 %     Weight 07/04/16 1203 255 lb (115.7 kg)     Height 07/04/16 1203 5\' 5"  (1.651 m)     Head Circumference --      Peak Flow --      Pain Score 07/04/16 1213 8     Pain Loc --      Pain Edu? --      Excl. in [place]? --    No data found.   Updated Vital Signs BP 125/77 (BP Location: Left Arm)  Pulse 60   Temp 98.3 F (36.8 C) (Oral)   Resp 18   Ht 5\' 5"  (1.651 m)   Wt 255 lb (115.7 kg)   SpO2 97%   BMI 42.43 kg/m   Visual Acuity Right Eye Distance:   Left Eye Distance:   Bilateral Distance:    Right Eye Near:   Left Eye Near:    Bilateral Near:     Physical Exam  Constitutional: She is oriented to person, place, and time. She appears well-developed and well-nourished. No distress.  Musculoskeletal: She exhibits tenderness.       Left knee: She exhibits decreased range of motion, swelling, deformity, abnormal alignment and MCL laxity. She exhibits normal patellar mobility. Tenderness found. Lateral joint line tenderness noted.       Legs: Neurological: She is alert and oriented to person, place, and time.  Skin: Skin is warm and dry.  Nursing note and vitals reviewed.    UC Treatments / Results  Labs (all labs ordered are listed, but only abnormal results are displayed) Labs Reviewed - No data to display  EKG  EKG Interpretation None       Radiology No results found.  Procedures Procedures  (including critical care time)  Medications Ordered in UC Medications - No data to display   Initial Impression / Assessment and Plan / UC Course  I have reviewed the triage vital signs and the nursing notes.  Pertinent labs & imaging results that were available during my care of the patient were reviewed by me and considered in my medical decision making (see chart for details).  Clinical Course      Final Clinical Impressions(s) / UC Diagnoses   Final diagnoses:  None    New Prescriptions New Prescriptions   No medications on file     Billy Fischer, MD 07/04/16 1220

## 2016-07-04 NOTE — ED Triage Notes (Signed)
States the back of her knee is painful Used ice and massage as tx  States area is swollen and spasms

## 2016-07-09 MED FILL — FAMOTIDINE 20 MG TABLET: 20 | 90 days supply | Qty: 90 | Fill #3

## 2016-07-10 DIAGNOSIS — M1711 Unilateral primary osteoarthritis, right knee: Secondary | ICD-10-CM | POA: Diagnosis not present

## 2016-07-10 DIAGNOSIS — M76892 Other specified enthesopathies of left lower limb, excluding foot: Secondary | ICD-10-CM | POA: Diagnosis not present

## 2016-07-19 ENCOUNTER — Ambulatory Visit (HOSPITAL_COMMUNITY): Payer: 59 | Admitting: Physical Therapy

## 2016-07-23 ENCOUNTER — Ambulatory Visit (HOSPITAL_COMMUNITY): Payer: 59 | Attending: Orthopedic Surgery | Admitting: Physical Therapy

## 2016-07-23 ENCOUNTER — Encounter (HOSPITAL_COMMUNITY): Payer: Self-pay | Admitting: Physical Therapy

## 2016-07-23 DIAGNOSIS — M62838 Other muscle spasm: Secondary | ICD-10-CM | POA: Insufficient documentation

## 2016-07-23 DIAGNOSIS — M79605 Pain in left leg: Secondary | ICD-10-CM | POA: Insufficient documentation

## 2016-07-23 NOTE — Therapy (Signed)
[place], [place], [address] Phone: [phone]   Fax:  [phone]  Physical Therapy Evaluation  Patient Details  Name: Nancy Gilmore MRN: [id number] Date of Birth: Mar 06, 1966 Referring Provider: Rod Can  Encounter Date: 07/23/2016      PT End of Session - 07/23/16 1128    Visit Number 1   Number of Visits 4   Date for PT Re-Evaluation 08/20/16   Authorization Type [facility] [employer]   Authorization Time Period 07/23/16 to 08/24/16   PT Start Time 1031   PT Stop Time 1115   PT Time Calculation (min) 44 min   Activity Tolerance Patient tolerated treatment well   Behavior During Therapy [facility] for tasks assessed/performed      Past Medical History:  Diagnosis Date  . Cellulitis of leg, right   . Fibroids    uterine    Past Surgical History:  Procedure Laterality Date  . ABDOMINAL HYSTERECTOMY      There were no vitals filed for this visit.       Subjective Assessment - 07/23/16 1039    Subjective Pt reports pain and tightness along the back of her lateral Lt knee that come out of nowhere back in July. She saw Dr. Lyla Glassing in August who referred her to [facility] for mangement. She has noticed the pain has gotten better since July. It bothers her daily, but is decreasing in frequency.    Pertinent History None    Limitations Other (comment)  stairs   How long can you sit comfortably? unlimited   How long can you stand comfortably? unlimited   How long can you walk comfortably? unlimited   Diagnostic tests Xray- negative    Patient Stated Goals improve pain and mobility    Currently in Pain? Other (Comment)  Worst: 7/10, Best: 0/10   Pain Location --  Posterior lateral knee   Pain Orientation Left   Pain Descriptors / Indicators Patsi Sears PT Assessment - 07/23/16 0001      Assessment   Medical Diagnosis Lt hamstring pain   Referring Provider Rod Can   Next MD Visit  Oct 10th maybe    Prior Therapy none      Precautions   Precautions None     Restrictions   Weight Bearing Restrictions No     Balance Screen   Has the patient fallen in the past 6 months No   Has the patient had a decrease in activity level because of a fear of falling?  No   Is the patient reluctant to leave their home because of a fear of falling?  No     Prior Function   Level of Independence Independent   Vocation Full time employment   Vocation Requirements hospital security     Cognition   Overall Cognitive Status Within Functional Limits for tasks assessed     ROM / Strength   AROM / PROM / Strength AROM;Strength     Strength   Strength Assessment Site Hip;Knee;Ankle   Right/Left Hip Right;Left   Right Hip Flexion 4+/5   Right Hip Extension 4/5   Right Hip ABduction 4+/5   Left Hip Flexion 4+/5   Left Hip Extension 4/5   Left Hip ABduction 4-/5   Right/Left Knee Right;Left   Right Knee Flexion 4/5   Right Knee Extension 5/5   Left Knee Flexion 3+/5  pain  mid hamstring    Left Knee Extension 5/5   Right/Left Ankle Right;Left   Right Ankle Dorsiflexion 5/5   Right Ankle Plantar Flexion 5/5   Left Ankle Dorsiflexion 5/5   Left Ankle Plantar Flexion 5/5  burning mid hamstring      Flexibility   Soft Tissue Assessment /Muscle Length yes   Hamstrings Lt: 30 deg, Rt: 20 deg   ITB WNL     Palpation   Palpation comment TTP: Lt lateral gastroc belly/peroneals; Lt mid hamstring, lateral distal hamstring     Transfers   Five time sit to stand comments  8.5 sec, no UE     Ambulation/Gait   Stairs Yes                   [facility] Adult PT Treatment/Exercise - 07/23/16 0001      Ambulation/Gait   Ambulation Distance (Feet) 100 Feet   Gait Comments Stairs with reciprocal pattern and no handrails, no pain reported. Ambulation: noting push off medial aspect of foot with foot eversion, resulting in excess use of lateral gastroc/peroneals     Balance    Balance Assessed Yes     High Level Balance   High Level Balance Comments several LOB ambulating on unsteady surface 4x10 ft, no pain reported      Exercises   Exercises Knee/Hip     Knee/Hip Exercises: Stretches   Passive Hamstring Stretch 30 seconds;Left;2 reps   Passive Hamstring Stretch Limitations 12" step    Gastroc Stretch 1 rep;30 seconds;Both   Gastroc Stretch Limitations targeting lateral gastroc specifically      Knee/Hip Exercises: Sidelying   Hip ABduction Left;1 set;5 reps   Hip ABduction Limitations against wall, for HEP demonstration only                 PT Education - 07/23/16 1122    Education provided Yes   Education Details eval findings/POC; implemented HEP   Person(s) Educated Patient   Methods Explanation;Demonstration;Handout   Comprehension Verbalized understanding;Returned demonstration          PT Short Term Goals - 07/23/16 1141      PT SHORT TERM GOAL #1   Title Pt will demo consistency and independence with her HEP to improve pain and strength   Time 1   Period Weeks   Status New           PT Long Term Goals - 07/23/16 1145      PT LONG TERM GOAL #1   Title Pt will demo 5/5 strength in all tested muscle groups to allow her to perform daily tasks with improved safety.   Time 4   Period Weeks   Status New     PT LONG TERM GOAL #2   Title Pt will demo improved activity tolerance evident by max pain rating no more than 3/10 VAS during work.    Time 4   Period Weeks   Status New     PT LONG TERM GOAL #3   Title Pt will demo improved muscle relaxation evident by decreased tenderness to palpation along her Lt gastroc and hamstring.    Time 4   Period Weeks   Status New               Plan - 07/23/16 1131    Clinical Impression Statement Pt is a 50yo F referred to [facility] concerning Lt posterior knee pain/stiffness of several months. Pain came on insidiously back in July but  has since alleviated some. She denies any  numbness/tingling, however she demonstrates limited hamstring flexibility, LE weakness and tenderness with palpation along her Lt lateral gastroc/peroneals and hamstring. Gait mechanics reveal increased use of this musculature as well, which could be causing increased irritation/overuse. I discussed eval findings/POC with pt and implemented her HEP with return demonstration of correct technique. She would benefit from skilled PT to address limitations in strength/flexibility and mobility to allow her to return to leisure and work activity without pain.    Rehab Potential Good   PT Frequency 1x / week   PT Duration 4 weeks   PT Treatment/Interventions ADLs/Self Care Home Management;Cryotherapy;Moist Heat;Therapeutic exercise;Therapeutic activities;Patient/family education;Orthotic Fit/Training;Stair training;Gait training;Balance training;Neuromuscular re-education   PT Next Visit Plan review HEP technique, hamstring and hip extensor strengthening    PT Home Exercise Plan standing hamstring stretch on step, standing gastroc stretch, sidelying hip ABD against wall 2x10.    Recommended Other Services none    Consulted and Agree with Plan of Care Patient      Patient will benefit from skilled therapeutic intervention in order to improve the following deficits and impairments:  Decreased strength, Impaired flexibility, Pain, Increased muscle spasms, Decreased activity tolerance  Visit Diagnosis: Pain In Left Leg  Other muscle spasm     Problem List There are no active problems to display for this patient.  12:20 PM,07/23/16 Elly Modena PT, DPT [facility] [address] [place], [place], [address] Phone: [phone]   Fax:  [phone]  Name: ARRETTA ORIOL MRN: [id number] Date of Birth: 1966-07-07

## 2016-07-25 ENCOUNTER — Encounter (HOSPITAL_COMMUNITY): Payer: 59

## 2016-08-02 ENCOUNTER — Ambulatory Visit (HOSPITAL_COMMUNITY): Payer: 59 | Admitting: Physical Therapy

## 2016-08-02 DIAGNOSIS — M79605 Pain in left leg: Secondary | ICD-10-CM | POA: Diagnosis not present

## 2016-08-02 DIAGNOSIS — M62838 Other muscle spasm: Secondary | ICD-10-CM

## 2016-08-02 NOTE — Therapy (Signed)
[place], [place], [address] Phone: [phone]   Fax:  [phone]  Physical Therapy Treatment  Patient Details  Name: Nancy Gilmore MRN: [id number] Date of Birth: [birth year] Referring Provider: Brain Swinteck   Encounter Date: 08/02/2016      PT End of Session - 08/02/16 0856    Visit Number 2   Number of Visits 2   Authorization - Visit Number 2   Authorization - Number of Visits 2   PT Start Time 3383   PT Stop Time 2919   PT Time Calculation (min) 41 min   Activity Tolerance Patient tolerated treatment well      Past Medical History:  Diagnosis Date  . Cellulitis of leg, right   . Fibroids    uterine    Past Surgical History:  Procedure Laterality Date  . ABDOMINAL HYSTERECTOMY      There were no vitals filed for this visit.      Subjective Assessment - 08/02/16 0821    Subjective Pt states that she has had no pain.  She feels that she does not need anymore therapy.     Pertinent History None    Limitations Other (comment)  stairs   How long can you sit comfortably? unlimited   How long can you stand comfortably? unlimited   How long can you walk comfortably? unlimited   Diagnostic tests Xray- negative    Patient Stated Goals improve pain and mobility    Currently in Pain? No/denies            [facility] PT Assessment - 08/02/16 0001      Assessment   Medical Diagnosis Lt hamstring pain   Referring Provider Brain Swinteck    Next MD Visit Oct 10th maybe    Prior Therapy none      Precautions   Precautions None     Restrictions   Weight Bearing Restrictions No     Prior Function   Level of Independence Independent   Vocation Full time employment   Vocation Requirements hospital security     Cognition   Overall Cognitive Status Within Functional Limits for tasks assessed     Strength   Right Hip Flexion 4+/5   Right Hip Extension 4/5  was 4/5   Right Hip ABduction  5/5  was 4+/5   Left Hip Flexion 4+/5  was 4+/5   Left Hip Extension 4/5  was 4/5   Left Hip ABduction 5/5  was 4-/5    Right Knee Flexion 5/5   Right Knee Extension 5/5  was 4/5   Left Knee Flexion 5/5  was 3+/5   Left Knee Extension 5/5   Right Ankle Dorsiflexion 5/5   Right Ankle Plantar Flexion 5/5   Left Ankle Dorsiflexion 5/5   Left Ankle Plantar Flexion 5/5  burning mid hamstring      Flexibility   Soft Tissue Assessment /Muscle Length yes   Hamstrings Lt: 25 deg, Rt: 15 deg  Lt was 30 Rt was 20    ITB WNL     Palpation   Palpation comment TTP: Lt lateral gastroc belly/peroneals; Lt mid hamstring, lateral distal hamstring     Transfers   Five time sit to stand comments  --     Ambulation/Gait   Stairs Yes   Gait Comments Stairs with reciprocal pattern and no handrails, no pain reported. Ambulation: noting push off medial aspect of foot with foot eversion, resulting in  excess use of lateral gastroc/peroneals     High Level Balance   High Level Balance Comments several LOB ambulating on unsteady surface 4x10 ft, no pain reported                      [facility] Adult PT Treatment/Exercise - 08/02/16 0001      Ambulation/Gait   Ambulation Distance (Feet) 100 Feet     Balance   Balance Assessed --     Exercises   Exercises Knee/Hip     Knee/Hip Exercises: Stretches   Passive Hamstring Stretch 30 seconds;Left;2 reps   Passive Hamstring Stretch Limitations 12" step    Gastroc Stretch Both;2 reps;30 seconds   Gastroc Stretch Limitations slant board      Knee/Hip Exercises: Standing   Knee Flexion Strengthening;Left;10 reps   Knee Flexion Limitations 4#   Rocker Board 1 minute   Rocker Board Limitations rt/lt; front and back    SLS with Vectors 2x 15 second hold      Knee/Hip Exercises: Seated   Sit to Sand 10 reps     Knee/Hip Exercises: Sidelying   Hip ABduction Left;1 set;5 reps   Hip ABduction Limitations against wall, for HEP  demonstration only      Knee/Hip Exercises: Prone   Hip Extension Strengthening;Both;10 reps                PT Education - 08/02/16 0855    Education provided Yes   Education Details The importance of completing balance activity; additional HEP   Person(s) Educated Patient   Methods Explanation   Comprehension Verbalized understanding;Returned demonstration;Verbal cues required          PT Short Term Goals - 08/02/16 0845      PT SHORT TERM GOAL #1   Title Pt will demo consistency and independence with her HEP to improve pain and strength   Time 1   Period Weeks   Status Achieved           PT Long Term Goals - 08/02/16 0845      PT LONG TERM GOAL #1   Title Pt will demo 5/5 strength in all tested muscle groups to allow her to perform daily tasks with improved safety.   Time 4   Period Weeks   Status Partially Met     PT LONG TERM GOAL #2   Title Pt will demo improved activity tolerance evident by max pain rating no more than 3/10 VAS during work.    Time 4   Period Weeks   Status Achieved     PT LONG TERM GOAL #3   Title Pt will demo improved muscle relaxation evident by decreased tenderness to palpation along her Lt gastroc and hamstring.    Time 4   Period Weeks   Status Achieved               Plan - 08/02/16 0857    Clinical Impression Statement Pt comes to department stating that she feels that she is ready for discharge.  Pt and therapist reviewed HEP, added additional exercises and went over goals.  Pt and terapist agreed pt was ready to be discharged.    Rehab Potential Good   PT Frequency 1x / week   PT Duration 4 weeks   PT Treatment/Interventions ADLs/Self Care Home Management;Cryotherapy;Moist Heat;Therapeutic exercise;Therapeutic activities;Patient/family education;Orthotic Fit/Training;Stair training;Gait training;Balance training;Neuromuscular re-education   PT Next Visit Plan discharge.    PT Home Exercise Plan standing  hamstring stretch on step, standing gastroc stretch, sidelying hip ABD against wall 2x10.    Consulted and Agree with Plan of Care Patient      Patient will benefit from skilled therapeutic intervention in order to improve the following deficits and impairments:  Decreased strength, Impaired flexibility, Pain, Increased muscle spasms, Decreased activity tolerance  Visit Diagnosis: Pain In Left Leg  Other muscle spasm     Problem List There are no active problems to display for this patient.   Rayetta Humphrey, PT CLT [phone] 08/02/2016, 8:59 AM  [place], [place], [address] Phone: [phone]   Fax:  [phone]  Name: Nancy Gilmore MRN: [id number] Date of Birth: [birth year]   PHYSICAL THERAPY DISCHARGE SUMMARY  Visits from Start of Care: 2  Current functional level related to goals / functional outcomes: See above    Remaining deficits: Mm strength   Education / Equipment: HEP  Plan: Patient agrees to discharge.  Patient goals were met. Patient is being discharged due to being pleased with the current functional level.  ?????        Rayetta Humphrey, [place] CLT [phone]

## 2016-08-02 NOTE — Patient Instructions (Addendum)
Functional Quadriceps: Sit to Stand    Sit on edge of chair, feet flat on floor. Stand upright, extending knees fully. Repeat _10___ times per set. Do ____ sets per session. Do ___2_ sessions per day. 1 http://orth.exer.us/734   Copyright  VHI. All rights reserved.  Strengthening: Straight Leg Raise (Phase 1)    Tighten muscles on front of right thigh, then lift leg __15__ inches from surface, keeping knee locked.  Repeat __10__ times per set. Do __1__ sets per session. Do __2__ sessions per day.  http://orth.exer.us/614   Copyright  VHI. All rights reserved.  Strengthening: Hip Extension (Prone)   Place 2 pillows under your lower abdominal area. Tighten your stomach and bottom Tighten muscles on front of left thigh, then lift leg __2__ inches from surface, keeping knee locked. Repeat to Rt as well  Repeat __10__ times per set. Do __1__ sets per session. Do _2___ sessions per day.  http://orth.exer.us/620   Copyright  VHI. All rights reserved.

## 2016-08-03 ENCOUNTER — Encounter (HOSPITAL_COMMUNITY): Payer: 59 | Admitting: Physical Therapy

## 2016-08-07 ENCOUNTER — Encounter (HOSPITAL_COMMUNITY): Payer: 59 | Admitting: Physical Therapy

## 2016-08-08 ENCOUNTER — Encounter (HOSPITAL_COMMUNITY): Payer: 59 | Admitting: Physical Therapy

## 2016-08-09 ENCOUNTER — Other Ambulatory Visit: Payer: Self-pay | Admitting: Obstetrics and Gynecology

## 2016-08-09 DIAGNOSIS — Z1231 Encounter for screening mammogram for malignant neoplasm of breast: Secondary | ICD-10-CM

## 2016-08-21 DIAGNOSIS — M76892 Other specified enthesopathies of left lower limb, excluding foot: Secondary | ICD-10-CM | POA: Diagnosis not present

## 2016-08-21 DIAGNOSIS — K760 Fatty (change of) liver, not elsewhere classified: Secondary | ICD-10-CM | POA: Diagnosis not present

## 2016-08-21 DIAGNOSIS — R740 Nonspecific elevation of levels of transaminase and lactic acid dehydrogenase [LDH]: Secondary | ICD-10-CM | POA: Diagnosis not present

## 2016-08-21 DIAGNOSIS — M1711 Unilateral primary osteoarthritis, right knee: Secondary | ICD-10-CM | POA: Diagnosis not present

## 2016-09-14 ENCOUNTER — Ambulatory Visit
Admission: RE | Admit: 2016-09-14 | Discharge: 2016-09-14 | Disposition: A | Discharge status: Home / Self Care | Payer: 59 | Source: Ambulatory Visit | Attending: Obstetrics and Gynecology | Admitting: Obstetrics and Gynecology

## 2016-09-14 DIAGNOSIS — Z1231 Encounter for screening mammogram for malignant neoplasm of breast: Secondary | ICD-10-CM | POA: Diagnosis not present

## 2016-10-03 MED FILL — FAMOTIDINE 20 MG TABLET: 20 | 90 days supply | Qty: 90 | Fill #0

## 2016-10-03 MED FILL — ESTRADIOL 0.1 MG PATCH: 0.1 | 28 days supply | Qty: 8 | Fill #0

## 2016-10-19 DIAGNOSIS — R740 Nonspecific elevation of levels of transaminase and lactic acid dehydrogenase [LDH]: Secondary | ICD-10-CM | POA: Diagnosis not present

## 2016-10-23 DIAGNOSIS — Z01419 Encounter for gynecological examination (general) (routine) without abnormal findings: Secondary | ICD-10-CM | POA: Diagnosis not present

## 2016-10-23 DIAGNOSIS — Z6841 Body Mass Index (BMI) 40.0 and over, adult: Secondary | ICD-10-CM | POA: Diagnosis not present

## 2016-10-23 MED FILL — ESTRADIOL 0.1 MG PATCH: 0.1 | 28 days supply | Qty: 8 | Fill #0

## 2016-10-26 DIAGNOSIS — K7581 Nonalcoholic steatohepatitis (NASH): Secondary | ICD-10-CM | POA: Diagnosis not present

## 2016-12-20 MED FILL — FAMOTIDINE 20 MG TABLET: 20 | 90 days supply | Qty: 90 | Fill #0

## 2016-12-27 MED FILL — ESTRADIOL 0.1 MG PATCH: 0.1 | 28 days supply | Qty: 8 | Fill #1

## 2017-02-14 MED FILL — ESTRADIOL 0.1 MG PATCH: 0.1 | 28 days supply | Qty: 8 | Fill #2

## 2017-03-20 MED FILL — FAMOTIDINE 20 MG TABLET: 20 | 90 days supply | Qty: 90 | Fill #0

## 2017-03-20 MED FILL — ESTRADIOL 0.1 MG PATCH: 0.1 | 28 days supply | Qty: 8 | Fill #3

## 2017-04-15 MED FILL — ESTRADIOL 0.1 MG PATCH: 0.1 | 28 days supply | Qty: 8 | Fill #4

## 2017-05-24 MED FILL — MINIVELLE 0.1 MG PATCH: 0.1 | 28 days supply | Qty: 8 | Fill #0

## 2017-06-19 MED FILL — MINIVELLE 0.1 MG PATCH: 0.1 | 28 days supply | Qty: 8 | Fill #1

## 2017-07-09 MED FILL — FAMOTIDINE 20 MG TABLET: 20 | 90 days supply | Qty: 90 | Fill #1

## 2017-07-16 ENCOUNTER — Telehealth: Payer: 59 | Admitting: Family

## 2017-07-16 DIAGNOSIS — J019 Acute sinusitis, unspecified: Secondary | ICD-10-CM

## 2017-07-16 DIAGNOSIS — B9689 Other specified bacterial agents as the cause of diseases classified elsewhere: Secondary | ICD-10-CM | POA: Diagnosis not present

## 2017-07-16 MED ORDER — AMOXICILLIN-POT CLAVULANATE 875-125 MG PO TABS: 1.0000 | ORAL_TABLET | Freq: Two times a day (BID) | ORAL | 0 refills | Status: DC

## 2017-07-16 NOTE — Progress Notes (Signed)

## 2017-07-17 MED FILL — MINIVELLE 0.1 MG PATCH: 0.1 | 28 days supply | Qty: 8 | Fill #2

## 2017-07-24 ENCOUNTER — Other Ambulatory Visit: Payer: Self-pay | Admitting: Obstetrics and Gynecology

## 2017-07-24 DIAGNOSIS — Z1231 Encounter for screening mammogram for malignant neoplasm of breast: Secondary | ICD-10-CM

## 2017-08-19 MED FILL — MINIVELLE 0.1 MG PATCH: 0.1 | 28 days supply | Qty: 8 | Fill #3

## 2017-09-20 ENCOUNTER — Ambulatory Visit: Payer: 59

## 2017-09-24 MED FILL — MINIVELLE 0.1 MG PATCH: 0.1 | 28 days supply | Qty: 8 | Fill #4

## 2017-10-09 ENCOUNTER — Ambulatory Visit
Admission: RE | Admit: 2017-10-09 | Discharge: 2017-10-09 | Disposition: A | Discharge status: Home / Self Care | Payer: 59 | Source: Ambulatory Visit | Attending: Obstetrics and Gynecology | Admitting: Obstetrics and Gynecology

## 2017-10-09 DIAGNOSIS — Z1231 Encounter for screening mammogram for malignant neoplasm of breast: Secondary | ICD-10-CM

## 2017-10-28 MED FILL — FAMOTIDINE 20 MG TABLET: 20 | 90 days supply | Qty: 90 | Fill #0

## 2017-10-28 MED FILL — ESTRADIOL 0.1 MG/24HR PTTW: 0.1 | 28 days supply | Qty: 8 | Fill #5

## 2017-10-31 DIAGNOSIS — Z808 Family history of malignant neoplasm of other organs or systems: Secondary | ICD-10-CM | POA: Diagnosis not present

## 2017-10-31 DIAGNOSIS — Z8049 Family history of malignant neoplasm of other genital organs: Secondary | ICD-10-CM | POA: Diagnosis not present

## 2017-10-31 DIAGNOSIS — Z01419 Encounter for gynecological examination (general) (routine) without abnormal findings: Secondary | ICD-10-CM | POA: Diagnosis not present

## 2017-10-31 DIAGNOSIS — Z6841 Body Mass Index (BMI) 40.0 and over, adult: Secondary | ICD-10-CM | POA: Diagnosis not present

## 2017-10-31 DIAGNOSIS — Z803 Family history of malignant neoplasm of breast: Secondary | ICD-10-CM | POA: Diagnosis not present

## 2017-10-31 DIAGNOSIS — Z801 Family history of malignant neoplasm of trachea, bronchus and lung: Secondary | ICD-10-CM | POA: Diagnosis not present

## 2017-10-31 DIAGNOSIS — Z8041 Family history of malignant neoplasm of ovary: Secondary | ICD-10-CM | POA: Diagnosis not present

## 2017-10-31 DIAGNOSIS — Z113 Encounter for screening for infections with a predominantly sexual mode of transmission: Secondary | ICD-10-CM | POA: Diagnosis not present

## 2017-10-31 DIAGNOSIS — Z800 Family history of malignant neoplasm of digestive organs: Secondary | ICD-10-CM | POA: Diagnosis not present

## 2017-10-31 DIAGNOSIS — N83209 Unspecified ovarian cyst, unspecified side: Secondary | ICD-10-CM | POA: Diagnosis not present

## 2017-12-10 MED FILL — ESTRADIOL 0.1 MG/24HR PTTW: 0.1 | 28 days supply | Qty: 8 | Fill #6

## 2017-12-13 DIAGNOSIS — Z809 Family history of malignant neoplasm, unspecified: Secondary | ICD-10-CM | POA: Diagnosis not present

## 2018-01-06 MED FILL — ESTRADIOL 0.1 MG/24HR PTTW: 0.1 | 28 days supply | Qty: 8 | Fill #7

## 2018-01-22 ENCOUNTER — Telehealth: Payer: 59 | Admitting: Family

## 2018-01-22 ENCOUNTER — Telehealth: Payer: 59 | Admitting: Nurse Practitioner

## 2018-01-22 DIAGNOSIS — R6889 Other general symptoms and signs: Secondary | ICD-10-CM | POA: Diagnosis not present

## 2018-01-22 DIAGNOSIS — J111 Influenza due to unidentified influenza virus with other respiratory manifestations: Secondary | ICD-10-CM

## 2018-01-22 MED ORDER — OSELTAMIVIR PHOSPHATE 75 MG PO CAPS: 75.0000 mg | ORAL_CAPSULE | Freq: Two times a day (BID) | ORAL | 0 refills | Status: DC

## 2018-01-22 NOTE — Progress Notes (Signed)

## 2018-01-22 NOTE — Progress Notes (Signed)
E visit for Flu like symptoms   We are sorry that you are not feeling well.  Here is how we plan to help! Based on what you have shared with me it looks like you may have flu-like symptoms that should be watched but do not seem to indicate anti-viral treatment.  Influenza or "the flu" is   an infection caused by a respiratory virus. The flu virus is highly contagious and persons who did not receive their yearly flu vaccination may "catch" the flu from close contact.  We have anti-viral medications to treat the viruses that cause this infection. They are not a "cure" and only shorten the course of the infection. These prescriptions are most effective when they are given within the first 2 days of "flu" symptoms. Antiviral medication are indicated if you have a high risk of complications from the flu. You should  also consider an antiviral medication if you are in close contact with someone who is at risk. These medications can help patients avoid complications from the flu  but have side effects that you should know. Possible side effects from Tamiflu or oseltamivir include nausea, vomiting, diarrhea, dizziness, headaches, eye redness, sleep problems or other respiratory symptoms. You should not take Tamiflu if you have an allergy to oseltamivir or any to the ingredients in Tamiflu.  Based upon your symptoms and potential risk factors   ANYONE WHO HAS FLU SYMPTOMS SHOULD: . Stay home. The flu is highly contagious and going out or to work exposes others! . Be sure to drink plenty of fluids. Water is fine as well as fruit juices, sodas and electrolyte beverages. You may want to stay away from caffeine or alcohol. If you are nauseated, try taking small sips of liquids. How do you know if you are getting enough fluid? Your urine should be a pale yellow or almost colorless. . Get rest. . Taking a steamy shower or using a humidifier may help nasal congestion and ease sore throat pain. Using a saline nasal  spray works much the same way. . Cough drops, hard candies and sore throat lozenges may ease your cough. . Line up a caregiver. Have someone check on you regularly.   GET HELP RIGHT AWAY IF: . You cannot keep down liquids or your medications. . You become short of breath . Your fell like you are going to pass out or loose consciousness. . Your symptoms persist after you have completed your treatment plan MAKE SURE YOU   Understand these instructions.  Will watch your condition.  Will get help right away if you are not doing well or get worse.  Your e-visit answers were reviewed by a board certified advanced clinical practitioner to complete your personal care plan.  Depending on the condition, your plan could have included both over the counter or prescription medications.  If there is a problem please reply  once you have received a response from your provider.  Your safety is important to [place].  If you have drug allergies check your prescription carefully.    You can use MyChart to ask questions about today's visit, request a non-urgent call back, or ask for a work or school excuse for 24 hours related to this e-Visit. If it has been greater than 24 hours you will need to follow up with your provider, or enter a new e-Visit to address those concerns.  You will get an e-mail in the next two days asking about your experience.  I hope that  your e-visit has been valuable and will speed your recovery. Thank you for using e-visits.

## 2018-02-03 MED FILL — FAMOTIDINE 20 MG TABLET: 20 | 90 days supply | Qty: 90 | Fill #0

## 2018-02-03 MED FILL — ESTRADIOL 0.1 MG/24HR PTTW: 0.1 | 28 days supply | Qty: 8 | Fill #8

## 2018-03-10 MED FILL — ESTRADIOL 0.1 MG/24HR PTTW: 0.1 | 84 days supply | Qty: 24 | Fill #0

## 2018-05-08 ENCOUNTER — Telehealth: Payer: 59 | Admitting: Family

## 2018-05-08 DIAGNOSIS — J00 Acute nasopharyngitis [common cold]: Secondary | ICD-10-CM | POA: Diagnosis not present

## 2018-05-08 DIAGNOSIS — R110 Nausea: Secondary | ICD-10-CM | POA: Diagnosis not present

## 2018-05-08 MED ORDER — ONDANSETRON 4 MG PO TBDP: 4.0000 mg | ORAL_TABLET | Freq: Four times a day (QID) | ORAL | 0 refills | Status: DC | PRN

## 2018-05-08 MED ORDER — FLUTICASONE PROPIONATE 50 MCG/ACT NA SUSP: 2.0000 | Freq: Every day | NASAL | 2 refills | Status: DC

## 2018-05-08 MED ORDER — BENZONATATE 100 MG PO CAPS: 100.0000 mg | ORAL_CAPSULE | Freq: Three times a day (TID) | ORAL | 0 refills | Status: DC | PRN

## 2018-05-08 NOTE — Progress Notes (Signed)
Thank you for the details you included in the comment boxes. Those details are very helpful in determining the best course of treatment for you and help [place] to provide the best care. See below. I will also send Zofran 4mg  every 6 hours as needed for nausea.   We are sorry you are not feeling well.  Here is how we plan to help!  Based on what you have shared with me, it looks like you may have a viral upper respiratory infection or a "common cold".  Colds are caused by a large number of viruses; however, rhinovirus is the most common cause.   Symptoms of the common cold vary from person to person, with common symptoms including sore throat, cough, and malaise.  A low-grade fever of 100.4 may present, but is often uncommon.  Symptoms vary however, and are closely related to a person's age or underlying illnesses.  The most common symptoms associated with the common cold are nasal discharge or congestion, cough, sneezing, headache and pressure in the ears and face.  Cold symptoms usually persist for about 3 to 10 days, but can last up to 2 weeks.  It is important to know that colds do not cause serious illness or complications in most cases.    The common cold is transmitted from person to person, with the most common method of transmission being a person's hands.  The virus is able to live on the skin and can infect other persons for up to 2 hours after direct contact.  Also, colds are transmitted when someone coughs or sneezes; thus, it is important to cover the mouth to reduce this risk.  To keep the spread of the common cold at [place], good hand hygiene is very important.  This is an infection that is most likely caused by a virus. There are no specific treatments for the common cold other than to help you with the symptoms until the infection runs its course.    For nasal congestion, you may use an oral decongestants such as Mucinex D or if you have glaucoma or high blood pressure use plain Mucinex.   Saline nasal spray or nasal drops can help and can safely be used as often as needed for congestion.  For your congestion, I have prescribed Fluticasone nasal spray one spray in each nostril twice a day  If you do not have a history of heart disease, hypertension, diabetes or thyroid disease, prostate/bladder issues or glaucoma, you may also use Sudafed to treat nasal congestion.  It is highly recommended that you consult with a pharmacist or your primary care physician to ensure this medication is safe for you to take.     If you have a cough, you may use cough suppressants such as Delsym and Robitussin.  If you have glaucoma or high blood pressure, you can also use Coricidin HBP.   For cough I have prescribed for you A prescription cough medication called Tessalon Perles 100 mg. You may take 1-2 capsules every 8 hours as needed for cough  If you have a sore or scratchy throat, use a saltwater gargle-  to  teaspoon of salt dissolved in a 4-ounce to 8-ounce glass of warm water.  Gargle the solution for approximately 15-30 seconds and then spit.  It is important not to swallow the solution.  You can also use throat lozenges/cough drops and Chloraseptic spray to help with throat pain or discomfort.  Warm or cold liquids can also be helpful in  relieving throat pain.  For headache, pain or general discomfort, you can use Ibuprofen or Tylenol as directed.   Some authorities believe that zinc sprays or the use of Echinacea may shorten the course of your symptoms.   HOME CARE . Only take medications as instructed by your medical team. . Be sure to drink plenty of fluids. Water is fine as well as fruit juices, sodas and electrolyte beverages. You may want to stay away from caffeine or alcohol. If you are nauseated, try taking small sips of liquids. How do you know if you are getting enough fluid? Your urine should be a pale yellow or almost colorless. . Get rest. . Taking a steamy shower or using a  humidifier may help nasal congestion and ease sore throat pain. You can place a towel over your head and breathe in the steam from hot water coming from a faucet. . Using a saline nasal spray works much the same way. . Cough drops, hard candies and sore throat lozenges may ease your cough. . Avoid close contacts especially the very young and the elderly . Cover your mouth if you cough or sneeze . Always remember to wash your hands.   GET HELP RIGHT AWAY IF: . You develop worsening fever. . If your symptoms do not improve within 10 days . You become short of breath. . You develop yellow or green discharge from your nose over 3 days. . You have coughing fits . You develop a severe head ache or visual changes. . You develop shortness of breath or difficulty breathing. . Your symptoms persist after you have completed your treatment plan  MAKE SURE YOU   Understand these instructions.  Will watch your condition.  Will get help right away if you are not doing well or get worse.  Your e-visit answers were reviewed by a board certified advanced clinical practitioner to complete your personal care plan. Depending upon the condition, your plan could have included both over the counter or prescription medications. Please review your pharmacy choice. If there is a problem, you may call our nursing hot line at and have the prescription routed to another pharmacy. Your safety is important to [place]. If you have drug allergies check your prescription carefully.   You can use MyChart to ask questions about today's visit, request a non-urgent call back, or ask for a work or school excuse for 24 hours related to this e-Visit. If it has been greater than 24 hours you will need to follow up with your provider, or enter a new e-Visit to address those concerns. You will get an e-mail in the next two days asking about your experience.  I hope that your e-visit has been valuable and will speed your recovery. Thank  you for using e-visits.

## 2018-05-28 ENCOUNTER — Ambulatory Visit (INDEPENDENT_AMBULATORY_CARE_PROVIDER_SITE_OTHER): Payer: Self-pay | Admitting: Nurse Practitioner

## 2018-05-28 VITALS — BP 120/70 | HR 72 | Temp 98.8°F | Resp 16 | Wt 246.4 lb

## 2018-05-28 DIAGNOSIS — M722 Plantar fascial fibromatosis: Secondary | ICD-10-CM

## 2018-05-28 MED ORDER — DICLOFENAC SODIUM 75 MG PO TBEC: 75.0000 mg | DELAYED_RELEASE_TABLET | Freq: Two times a day (BID) | ORAL | 0 refills | Status: AC

## 2018-05-28 NOTE — Progress Notes (Signed)
Subjective:    Nancy Gilmore is a 52 y.o. female who presents with left foot pain. Onset of the symptoms was several weeks ago. Precipitating event: none known. Current symptoms include: ability to bear weight, but with some pain, pain with inversion of the foot and pain with eversion of the foot. Aggravating factors: any weight bearing. Symptoms have stabilized. Patient has had prior foot problems. Evaluation to date: none. Treatment to date: none.  States she has a history of plantar fasciitis, and was given diclofenac approximately 2 years ago by a podiatrist.  The patient states that right now her pain rates at 8 out of 10 at present, and again worsens with ambulating.  Patient states she has tried insoles which have helped somewhat, and that she has these flareups about every couple of years.  She denies any injury, other precipitating event.  The following portions of the patient's history were reviewed and updated as appropriate: allergies, current medications and past medical history.  Review of Systems Constitutional: negative Cardiovascular: negative Gastrointestinal: negative Musculoskeletal:positive for left foot pain, negative for back pain, bone pain and muscle weakness    Objective:    BP 120/70 (BP Location: Right Arm, Patient Position: Sitting, Cuff Size: Large)   Pulse 72   Temp 98.8 F (37.1 C) (Oral)   Resp 16   Wt 246 lb 6.4 oz (111.8 kg)   SpO2 96%   BMI 41.00 kg/m  Right foot:  normal exam, no swelling, tenderness, instability; ligaments intact, full range of motion of all ankle/foot joints  Left foot:  point tenderness over the heel pad and normal microcirculation and capillary reflow    Assessment:    Plantar fasciitis    Plan:  Exam findings, diagnosis etiology and medication use and indications reviewed with patient. Follow- Up and discharge instructions provided. No emergent/urgent issues found on exam.  Patient verbalized understanding of information  provided and agrees with plan of care (POC), all questions answered.  Plantar Fasciitis -RICE therapy -wear shoes with good support -perform stretching exercises as provided -follow up with podiatrist/ortho as needed. 1. Plantar fasciitis of left foot  - diclofenac (VOLTAREN) 75 MG EC tablet; Take 1 tablet (75 mg total) by mouth 2 (two) times daily for 14 days.  Dispense: 28 tablet; Refill: 0

## 2018-05-28 NOTE — Patient Instructions (Signed)
Plantar Fasciitis Plantar fasciitis is a painful foot condition that affects the heel. It occurs when the band of tissue that connects the toes to the heel bone (plantar fascia) becomes irritated. This can happen after exercising too much or doing other repetitive activities (overuse injury). The pain from plantar fasciitis can range from mild irritation to severe pain that makes it difficult for you to walk or move. The pain is usually worse in the morning or after you have been sitting or lying down for a while. What are the causes? This condition may be caused by:  Standing for long periods of time.  Wearing shoes that do not fit.  Doing high-impact activities, including running, aerobics, and ballet.  Being overweight.  Having an abnormal way of walking (gait).  Having tight calf muscles.  Having high arches in your feet.  Starting a new athletic activity.  What are the signs or symptoms? The main symptom of this condition is heel pain. Other symptoms include:  Pain that gets worse after activity or exercise.  Pain that is worse in the morning or after resting.  Pain that goes away after you walk for a few minutes.  How is this diagnosed? This condition may be diagnosed based on your signs and symptoms. Your health care provider will also do a physical exam to check for:  A tender area on the bottom of your foot.  A high arch in your foot.  Pain when you move your foot.  Difficulty moving your foot.  You may also need to have imaging studies to confirm the diagnosis. These can include:  X-rays.  Ultrasound.  MRI.  How is this treated? Treatment for plantar fasciitis depends on the severity of the condition. Your treatment may include:  Rest, ice, and over-the-counter pain medicines to manage your pain.  Exercises to stretch your calves and your plantar fascia.  A splint that holds your foot in a stretched, upward position while you sleep (night  splint).  Physical therapy to relieve symptoms and prevent problems in the future.  Cortisone injections to relieve severe pain.  Extracorporeal shock wave therapy (ESWT) to stimulate damaged plantar fascia with electrical impulses. It is often used as a last resort before surgery.  Surgery, if other treatments have not worked after 12 months.  Follow these instructions at home:  Take medicines only as directed by your health care provider.  Avoid activities that cause pain.  Roll the bottom of your foot over a bag of ice or a bottle of cold water. Do this for 20 minutes, 3-4 times a day.  Perform simple stretches as directed by your health care provider.  Try wearing athletic shoes with air-sole or gel-sole cushions or soft shoe inserts.  Wear a night splint while sleeping, if directed by your health care provider.  Keep all follow-up appointments with your health care provider. How is this prevented?  Do not perform exercises or activities that cause heel pain.  Consider finding low-impact activities if you continue to have problems.  Lose weight if you need to. The best way to prevent plantar fasciitis is to avoid the activities that aggravate your plantar fascia. Contact a health care provider if:  Your symptoms do not go away after treatment with home care measures.  Your pain gets worse.  Your pain affects your ability to move or do your daily activities. This information is not intended to replace advice given to you by your health care provider. Make sure you   discuss any questions you have with your health care provider. Document Released: 07/24/2001 Document Revised: 04/02/2016 Document Reviewed: 09/08/2014 Elsevier Interactive Patient Education  2018 [place].  Plantar Fasciitis Rehab Ask your health care provider which exercises are safe for you. Do exercises exactly as told by your health care provider and adjust them as directed. It is normal to feel  mild stretching, pulling, tightness, or discomfort as you do these exercises, but you should stop right away if you feel sudden pain or your pain gets worse. Do not begin these exercises until told by your health care provider. Stretching and range of motion exercises These exercises warm up your muscles and joints and improve the movement and flexibility of your foot. These exercises also help to relieve pain. Exercise A: Plantar fascia stretch  1. Sit with your left / right leg crossed over your opposite knee. 2. Hold your heel with one hand with that thumb near your arch. With your other hand, hold your toes and gently pull them back toward the top of your foot. You should feel a stretch on the bottom of your toes or your foot or both. 3. Hold this stretch for__________ seconds. 4. Slowly release your toes and return to the starting position. Repeat __________ times. Complete this exercise __________ times a day. Exercise B: Gastroc, standing  1. Stand with your hands against a wall. 2. Extend your left / right leg behind you, and bend your front knee slightly. 3. Keeping your heels on the floor and keeping your back knee straight, shift your weight toward the wall without arching your back. You should feel a gentle stretch in your left / right calf. 4. Hold this position for __________ seconds. Repeat __________ times. Complete this exercise __________ times a day. Exercise C: Soleus, standing 1. Stand with your hands against a wall. 2. Extend your left / right leg behind you, and bend your front knee slightly. 3. Keeping your heels on the floor, bend your back knee and slightly shift your weight over the back leg. You should feel a gentle stretch deep in your calf. 4. Hold this position for __________ seconds. Repeat __________ times. Complete this exercise __________ times a day. Exercise D: Gastrocsoleus, standing 1. Stand with the ball of your left / right foot on a step. The ball of  your foot is on the walking surface, right under your toes. 2. Keep your other foot firmly on the same step. 3. Hold onto the wall or a railing for balance. 4. Slowly lift your other foot, allowing your body weight to press your heel down over the edge of the step. You should feel a stretch in your left / right calf. 5. Hold this position for __________ seconds. 6. Return both feet to the step. 7. Repeat this exercise with a slight bend in your left / right knee. Repeat __________ times with your left / right knee straight and __________ times with your left / right knee bent. Complete this exercise __________ times a day. Balance exercise This exercise builds your balance and strength control of your arch to help take pressure off your plantar fascia. Exercise E: Single leg stand 1. Without shoes, stand near a railing or in a doorway. You may hold onto the railing or door frame as needed. 2. Stand on your left / right foot. Keep your big toe down on the floor and try to keep your arch lifted. Do not let your foot roll inward. 3. Hold this  position for __________ seconds. 4. If this exercise is too easy, you can try it with your eyes closed or while standing on a pillow. Repeat __________ times. Complete this exercise __________ times a day. This information is not intended to replace advice given to you by your health care provider. Make sure you discuss any questions you have with your health care provider. Document Released: 10/29/2005 Document Revised: 07/03/2016 Document Reviewed: 09/12/2015 Elsevier Interactive Patient Education  2018 [employer].

## 2018-05-29 MED FILL — ESTRADIOL 0.1 MG/24HR PTTW: 0.1 | 84 days supply | Qty: 24 | Fill #1

## 2018-05-29 MED FILL — FAMOTIDINE 20 MG TABLET: 20 | 90 days supply | Qty: 90 | Fill #1

## 2018-08-20 MED FILL — ESTRADIOL 0.1 MG/24HR PTTW: 0.1 | 84 days supply | Qty: 24 | Fill #2

## 2018-09-02 ENCOUNTER — Other Ambulatory Visit: Payer: Self-pay | Admitting: Obstetrics and Gynecology

## 2018-09-02 DIAGNOSIS — Z1231 Encounter for screening mammogram for malignant neoplasm of breast: Secondary | ICD-10-CM

## 2018-10-16 ENCOUNTER — Ambulatory Visit
Admission: RE | Admit: 2018-10-16 | Discharge: 2018-10-16 | Disposition: A | Discharge status: Home / Self Care | Payer: 59 | Source: Ambulatory Visit | Attending: Obstetrics and Gynecology | Admitting: Obstetrics and Gynecology

## 2018-10-16 DIAGNOSIS — Z1231 Encounter for screening mammogram for malignant neoplasm of breast: Secondary | ICD-10-CM | POA: Diagnosis not present

## 2018-12-08 MED FILL — FAMOTIDINE 20 MG TABS: 20 | 90 days supply | Qty: 90 | Fill #2

## 2018-12-08 MED FILL — ESTRADIOL 0.1 MG/24HR PTTW: 0.1 | 84 days supply | Qty: 24 | Fill #0

## 2019-02-24 MED FILL — ESTRADIOL 0.1 MG/24HR PTTW: 0.1 | 84 days supply | Qty: 24 | Fill #0

## 2019-03-19 ENCOUNTER — Ambulatory Visit (INDEPENDENT_AMBULATORY_CARE_PROVIDER_SITE_OTHER): Payer: 59 | Admitting: Internal Medicine

## 2019-05-14 ENCOUNTER — Other Ambulatory Visit: Payer: Self-pay | Admitting: Podiatry

## 2019-05-14 ENCOUNTER — Ambulatory Visit: Payer: No Typology Code available for payment source | Admitting: Podiatry

## 2019-05-14 ENCOUNTER — Ambulatory Visit (INDEPENDENT_AMBULATORY_CARE_PROVIDER_SITE_OTHER): Payer: No Typology Code available for payment source

## 2019-05-14 ENCOUNTER — Other Ambulatory Visit: Payer: Self-pay

## 2019-05-14 ENCOUNTER — Encounter: Payer: Self-pay | Admitting: Podiatry

## 2019-05-14 VITALS — BP 128/66 | HR 69 | Temp 97.3°F | Resp 16

## 2019-05-14 DIAGNOSIS — M722 Plantar fascial fibromatosis: Secondary | ICD-10-CM

## 2019-05-14 DIAGNOSIS — M79672 Pain in left foot: Secondary | ICD-10-CM

## 2019-05-14 NOTE — Progress Notes (Signed)
   Subjective:    Patient ID: Nancy Gilmore, female    DOB: 09/04/1966, 53 y.o.   MRN: [id number]  HPI    Review of Systems  All other systems reviewed and are negative.      Objective:   Physical Exam        Assessment & Plan:

## 2019-05-14 NOTE — Patient Instructions (Signed)

## 2019-05-14 NOTE — Progress Notes (Signed)
Subjective:   Patient ID: Nancy Gilmore, female   DOB: 53 y.o.   MRN: [id number]   HPI Patient presents stating she has a lot of pain in the bottom of the left heel and it is gradually become worse over the last 2 months.  States it is worse when she gets up in the morning and after periods of sitting.  Patient does not smoke likes to be active   Review of Systems  All other systems reviewed and are negative.       Objective:  Physical Exam Vitals signs and nursing note reviewed.  Constitutional:      Appearance: She is well-developed.  Pulmonary:     Effort: Pulmonary effort is normal.  Musculoskeletal: Normal range of motion.  Skin:    General: Skin is warm.  Neurological:     Mental Status: She is alert.     Neurovascular status found to be intact muscle strength is adequate range of motion within normal limits.  Patient does have exquisite discomfort plantar aspect left heel at the insertional point of the tendon into the calcaneus with inflammation fluid around the medial band and moderate depression of the arch noted.  Patient is found to have good digital perfusion well oriented x3     Assessment:  Acute plantar fasciitis left with inflammation fluid buildup     Plan:  H&P x-ray reviewed and today I did sterile prep and injected the fascia 3 mg Kenalog 5 mg Xylocaine and applied fascial brace with instructions on usage.  Gave instructions for physical therapy and shoe gear modifications and reappoint 2 weeks  X-rays indicate there is spur formation with no indications of stress fracture arthritis

## 2019-05-15 MED FILL — ESTRADIOL 0.1 MG/24HR PTTW: 0.1 | 84 days supply | Qty: 24 | Fill #1

## 2019-05-28 ENCOUNTER — Encounter: Payer: Self-pay | Admitting: Podiatry

## 2019-05-28 ENCOUNTER — Other Ambulatory Visit: Payer: Self-pay

## 2019-05-28 ENCOUNTER — Ambulatory Visit: Payer: No Typology Code available for payment source | Admitting: Podiatry

## 2019-05-28 VITALS — Temp 97.1°F

## 2019-05-28 DIAGNOSIS — M722 Plantar fascial fibromatosis: Secondary | ICD-10-CM

## 2019-05-28 NOTE — Progress Notes (Signed)
Subjective:   Patient ID: Nancy Gilmore, female   DOB: 53 y.o.   MRN: [id number]   HPI Patient states she is still having pain but it seems to be some improved from before but there still is an area that is quite sore.  Patient states more towards the center in the outside of the heel   ROS      Objective:  Physical Exam  Neurovascular status found to be intact muscle strength was adequate with patient found to have discomfort in the plantar and plantar lateral aspect of the left heel with inflammation fluid buildup that is worse when getting up in the morning after periods of sitting     Assessment:  Continued fasciitis left with inflammation fluid buildup     Plan:  Reviewed condition at this point dispensed night splint with all instructions on usage and I want her sleeping with it the next few weeks.  I then did sterile prep and injected the plantar fascia 3 mg Kenalog 5 mg Xylocaine advised on physical therapy anti-inflammatories and again night splint usage and will be seen back to recheck signed visit

## 2019-07-15 ENCOUNTER — Ambulatory Visit (INDEPENDENT_AMBULATORY_CARE_PROVIDER_SITE_OTHER): Payer: Self-pay | Admitting: Internal Medicine

## 2019-07-21 ENCOUNTER — Telehealth: Payer: No Typology Code available for payment source | Admitting: Physician Assistant

## 2019-07-21 DIAGNOSIS — H9212 Otorrhea, left ear: Secondary | ICD-10-CM

## 2019-07-21 MED ORDER — NEOMYCIN-POLYMYXIN-HC 3.5-10000-1 OT SOLN: 4.0000 [drp] | Freq: Four times a day (QID) | OTIC | 0 refills | Status: AC

## 2019-07-21 MED FILL — NEO/POLYMYXIN/HC EAR SOLN: 3.5-10000-1 | 7 days supply | Qty: 10 | Fill #0

## 2019-07-21 NOTE — Progress Notes (Signed)
E Visit for Swimmer's Ear  We are sorry that you are not feeling well. Here is how we plan to help!  Based on what you have shared with me it looks like you have an external ear infection (like swimmer's ear). Swimmer's ear is a redness or swelling, irritation, or infection of your outer ear canal.  These symptoms usually occur within a few days of swimming.  Your ear canal is a tube that goes from the opening of the ear to the eardrum.  When water stays in your ear canal, germs can grow.  This is a painful condition that often happens to children and swimmers of all ages.  It is not contagious and oral antibiotics are not required to treat uncomplicated swimmer's ear.  The usual symptoms include: Itching inside the ear, Redness or a sense of swelling in the ear, Pain when the ear is tugged on when pressure is placed on the ear, Pus draining from the infected ear.  I have prescribed: Neomycin 0.35%, polymyxin B 10,000 units/mL, and hydrocortisone 0,5% otic solution 4 drops in affected ears four times a day for 7 days  In certain cases swimmer's ear may progress to a more serious bacterial infection of the middle or inner ear.  If you have a fever 102 and up and significantly worsening symptoms, this could indicate a more serious infection moving to the middle/inner and needs face to face evaluation in an office by a provider.  Your symptoms should improve over the next 3 days and should resolve in about 7 days.  HOME CARE:   Wash your hands frequently.  Do not place the tip of the bottle on your ear or touch it with your fingers.  You can take Acetominophen 650 mg every 4-6 hours as needed for pain.  If pain is severe or moderate, you can apply a heating pad (set on low) or hot water bottle (wrapped in a towel) to outer ear for 20 minutes.  This will also increase drainage.  Avoid ear plugs  Do not use Q-tips  After showers, help the water run out by tilting your head to one side.  GET  HELP RIGHT AWAY IF:   Fever is over 102.2 degrees.  You develop progressive ear pain or hearing loss.  Ear symptoms persist longer than 3 days after treatment.  MAKE SURE YOU:   Understand these instructions.  Will watch your condition.  Will get help right away if you are not doing well or get worse.  TO PREVENT SWIMMER'S EAR:  Use a bathing cap or custom fitted swim molds to keep your ears dry.  Towel off after swimming to dry your ears.  Tilt your head or pull your earlobes to allow the water to escape your ear canal.  If there is still water in your ears, consider using a hairdryer on the lowest setting.  Thank you for choosing an e-visit. Your e-visit answers were reviewed by a board certified advanced clinical practitioner to complete your personal care plan. Depending upon the condition, your plan could have included both over the counter or prescription medications. Please review your pharmacy choice. Be sure that the pharmacy you have chosen is open so that you can pick up your prescription now.  If there is a problem you may message your provider in [place] to have the prescription routed to another pharmacy. Your safety is important to [place]. If you have drug allergies check your prescription carefully.  For the next 24  hours, you can use MyChart to ask questions about today's visit, request a non-urgent call back, or ask for a work or school excuse from your e-visit provider. You will get an email in the next two days asking about your experience. I hope that your e-visit has been valuable and will speed your recovery.  Approximately 5 minutes of time have been spent researching, coordinating, and implementing care for this patient today.

## 2019-08-05 MED FILL — ESTRADIOL 0.1 MG/24HR PTTW: 0.1 | 84 days supply | Qty: 24 | Fill #2

## 2019-08-11 ENCOUNTER — Encounter (INDEPENDENT_AMBULATORY_CARE_PROVIDER_SITE_OTHER): Payer: Self-pay | Admitting: Internal Medicine

## 2019-08-11 ENCOUNTER — Other Ambulatory Visit: Payer: Self-pay

## 2019-08-11 ENCOUNTER — Ambulatory Visit (INDEPENDENT_AMBULATORY_CARE_PROVIDER_SITE_OTHER): Payer: No Typology Code available for payment source | Admitting: Internal Medicine

## 2019-08-11 ENCOUNTER — Other Ambulatory Visit (INDEPENDENT_AMBULATORY_CARE_PROVIDER_SITE_OTHER): Payer: Self-pay | Admitting: Internal Medicine

## 2019-08-11 ENCOUNTER — Telehealth (INDEPENDENT_AMBULATORY_CARE_PROVIDER_SITE_OTHER): Payer: Self-pay

## 2019-08-11 VITALS — BP 130/80 | HR 72 | Ht 65.0 in | Wt 228.4 lb

## 2019-08-11 DIAGNOSIS — K219 Gastro-esophageal reflux disease without esophagitis: Secondary | ICD-10-CM | POA: Diagnosis not present

## 2019-08-11 DIAGNOSIS — E559 Vitamin D deficiency, unspecified: Secondary | ICD-10-CM | POA: Diagnosis not present

## 2019-08-11 DIAGNOSIS — E8941 Symptomatic postprocedural ovarian failure: Secondary | ICD-10-CM | POA: Diagnosis not present

## 2019-08-11 DIAGNOSIS — N393 Stress incontinence (female) (male): Secondary | ICD-10-CM

## 2019-08-11 DIAGNOSIS — E669 Obesity, unspecified: Secondary | ICD-10-CM | POA: Diagnosis not present

## 2019-08-11 HISTORY — DX: Gastro-esophageal reflux disease without esophagitis: K21.9

## 2019-08-11 HISTORY — DX: Obesity, unspecified: E66.9

## 2019-08-11 HISTORY — DX: Vitamin D deficiency, unspecified: E55.9

## 2019-08-11 LAB — VITAMIN D 25 HYDROXY (VIT D DEFICIENCY, FRACTURES): Vit D, 25-Hydroxy: 41 ng/mL (ref 30–100)

## 2019-08-11 LAB — ESTRADIOL: Estradiol: 436 pg/mL — ABNORMAL HIGH

## 2019-08-11 NOTE — Progress Notes (Signed)
[facility] for estradiol cream with a dose of 0.1 mg applied to the labia daily.  They will give it to her as a topiclick.

## 2019-08-11 NOTE — Progress Notes (Signed)
Wellness Office Visit  Subjective:  Patient ID: Nancy Gilmore, female    DOB: 04-Jul-1966  Age: 53 y.o. MRN: [id number]  CC: This lady comes in for follow-up of obesity, vitamin D deficiency, postsurgical menopause. HPI  When she had her total abdominal hysterectomy and both her ovaries removed at the age of 77, she started to get hot flashes and mood swings.  At this time she was started on estradiol patch and this is helped her tremendously. She now complains of urinary stress incontinence and this may be related to her surgery for the hysterectomy or related to the cesarean section or both. In terms of her obesity, she does do intermittent fasting but not for longer hours as indicated below.  Past Medical History:  Diagnosis Date  . Cellulitis of leg, right   . Fibroids    uterine  . GERD (gastroesophageal reflux disease) 08/11/2019  . Obesity (BMI 30-39.9) 08/11/2019  . Vitamin D deficiency disease 08/11/2019      Family History  Problem Relation Age of Onset  . 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     Current Meds  Medication Sig  . Cholecalciferol (VITAMIN D-3) 125 MCG (5000 UT) TABS Take 1 tablet by mouth daily.  Marland Kitchen estradiol (VIVELLE-DOT) 0.1 MG/24HR patch      Nutrition  She tends to do intermittent fasting for approximately 13 hours.  She tends to have a sweet tooth so when she does eat I am not sure that she is eating healthy foods. Sleep  Adequate sleep.  Typically gets 6 to 8 hours uninterrupted sleep.  Exercise  She walks 2 to 3 miles on a regular basis during the week.  She is not doing any strength training but would like to do some now. Bio Identical Hormones  Estradiol is being used in this patient for multiple benefits based on several studies including protection against heart disease, cerebrovascular disease, osteoporosis, colon cancer, Alzheimer's disease,  macular degeneration and cataracts. The patient has been counseled regarding benefits and side effects and modes of administration. The patient is agreeable that this therapy is an integral to part of her wellness, quality of life and prevention of chronic disease.  Objective:   Today's Vitals: BP 130/80   Pulse 72   Ht 5\' 5"  (1.651 m)   Wt 228 lb 6.4 oz (103.6 kg)   BMI 38.01 kg/m  Vitals with BMI 08/11/2019 05/14/2019 05/28/2018  Height 5\' 5"  - -  Weight 228 lbs 6 oz - 246 lbs 6 oz  BMI [id number] - -  Systolic [id number]  Diastolic 80 66 70  Pulse 72 69 72     Physical Exam       Assessment   1. Hot flashes due to surgical menopause   2. Vitamin D deficiency disease   3. Obesity (BMI 30-39.9)   4. Gastroesophageal reflux disease without esophagitis   5. Stress incontinence of urine       Tests ordered Orders Placed This Encounter  Procedures  . VITAMIN D 25 Hydroxy (Vit-D Deficiency, Fractures)  . Estradiol     Plan: 1. I am going to try her on estradiol cream for her stress incontinence to see if this will help her in addition she will continue with the topical patch of estradiol. 2. Blood work is ordered as above. 3. I discussed the possibility of using other hormones  such as progesterone and testosterone again but I do not think she is very keen on this. 4. Further recommendations will depend on blood results and I will have her follow-up for a physical in March of next year with Judson Roch.     Doree Albee, MD

## 2019-08-11 NOTE — Patient Instructions (Addendum)
Nancy Gilmore Nancy Gilmore Dietary Recommendations for Weight Loss What to Avoid . Avoid added sugars o Often added sugar can be found in processed foods such as many condiments, dry cereals, cakes, cookies, chips, crisps, crackers, candies, sweetened drinks, etc.  o Read labels and AVOID/DECREASE use of foods with the following in their ingredient list: Sugar, fructose, high fructose corn syrup, sucrose, glucose, maltose, dextrose, molasses, cane sugar, brown sugar, any type of syrup, agave nectar, etc.   . Avoid snacking in between meals . Avoid foods made with flour o If you are going to eat food made with flour, choose those made with whole-grains; and, minimize your consumption as much as is tolerable . Avoid processed foods o These foods are generally stocked in the middle of the grocery store. Focus on shopping on the perimeter of the grocery.  What to Include . Vegetables o GREEN LEAFY VEGETABLES: Kale, spinach, mustard greens, collard greens, cabbage, broccoli, etc. o OTHER: Asparagus, cauliflower, eggplant, carrots, peas, Brussel sprouts, tomatoes, bell peppers, zucchini, beets, cucumbers, etc. . Grains, seeds, and legumes o Beans: kidney beans, black eyed peas, garbanzo beans, black beans, pinto beans, etc. o Whole, unrefined grains: brown rice, barley, bulgur, oatmeal, etc. . Healthy fats  o Avoid highly processed fats such as vegetable oil o Examples of healthy fats: avocado, olives, virgin olive oil, dark chocolate (?72% Cocoa), nuts (peanuts, almonds, walnuts, cashews, pecans, etc.) . Low - Moderate Intake of Animal Sources of Protein o Meat sources: chicken, turkey, salmon, tuna. Limit to 4 ounces of meat at one time. o Consider limiting dairy sources, but when choosing dairy focus on: PLAIN Greek yogurt, cottage cheese, high-protein milk . Fruit o Choose berries  When to Eat . Intermittent Fasting: o Choosing not to eat for a specific time period, but DO FOCUS ON HYDRATION  when fasting o Multiple Techniques: - Time Restricted Eating: eat 3 meals in a day, each meal lasting no more than 60 minutes, no snacks between meals - 16-18 hour fast: fast for 16 to 18 hours up to 7 days a week. Often suggested to start with 2-3 nonconsecutive days per week.  . Remember the time you sleep is counted as fasting.  . Examples of eating schedule: Fast from 7:00pm-11:00am. Eat between 11:00am-7:00pm.  - 24-hour fast: fast for 24 hours up to every other day. Often suggested to start with 1 day per week . Remember the time you sleep is counted as fasting . Examples of eating schedule:  o Eating day: eat 2-3 meals on your eating day. If doing 2 meals, each meal should last no more than 90 minutes. If doing 3 meals, each meal should last no more than 60 minutes. Finish last meal by 7:00pm. o Fasting day: Fast until 7:00pm.  o IF YOU FEEL UNWELL FOR ANY REASON/IN ANY WAY WHEN FASTING, STOP FASTING BY EATING A NUTRITIOUS SNACK OR LIGHT MEAL o ALWAYS FOCUS ON HYDRATION DURING FASTS - Acceptable Hydration sources: water, broths, tea/coffee (black tea/coffee is best but using a small amount of whole-fat dairy products in coffee/tea is acceptable).  - Poor Hydration Sources: anything with sugar or artificial sweeteners added to it  These recommendations have been developed for patients that are actively receiving medical care from either Nancy Gilmore or Nancy Gilmore, Nancy Gilmore, Nancy Gilmore at Nancy Gilmore. These recommendations are developed for patients with specific medical conditions and are not meant to be distributed or used by others that are not actively receiving care from either provider listed   above at Nancy Gilmore. It is not appropriate to participate in the above eating plans without proper medical supervision.   Reference: Nancy Gilmore. The obesity code. [place]; 2016.  [email or web address]

## 2019-08-12 NOTE — Progress Notes (Signed)
Your estradiol level is fantastic.  Continue with the same estradiol patch.  Your vitamin D level definitely needs to be better so make sure you increase vitamin D3 to 10,000 units daily.  If you have any questions, do not hesitate to contact me through my chart.  I did call in your estradiol cream to [facility].  Be well!

## 2019-08-13 ENCOUNTER — Telehealth (INDEPENDENT_AMBULATORY_CARE_PROVIDER_SITE_OTHER): Payer: Self-pay

## 2019-08-13 NOTE — Telephone Encounter (Signed)
Please let the patient know that I have called in the estradiol cream to [facility] already.

## 2019-08-13 NOTE — Telephone Encounter (Signed)
PCP called order in on day of visit.

## 2019-09-14 ENCOUNTER — Other Ambulatory Visit: Payer: Self-pay | Admitting: Obstetrics and Gynecology

## 2019-09-14 DIAGNOSIS — Z1231 Encounter for screening mammogram for malignant neoplasm of breast: Secondary | ICD-10-CM

## 2019-10-28 MED FILL — ESTRADIOL 0.1 MG/24HR PTTW: 0.1 | 84 days supply | Qty: 24 | Fill #3

## 2019-11-04 ENCOUNTER — Ambulatory Visit
Admission: RE | Admit: 2019-11-04 | Discharge: 2019-11-04 | Disposition: A | Discharge status: Home / Self Care | Payer: No Typology Code available for payment source | Source: Ambulatory Visit | Attending: Obstetrics and Gynecology | Admitting: Obstetrics and Gynecology

## 2019-11-04 ENCOUNTER — Other Ambulatory Visit: Payer: Self-pay

## 2019-11-04 DIAGNOSIS — Z1231 Encounter for screening mammogram for malignant neoplasm of breast: Secondary | ICD-10-CM

## 2019-12-11 IMAGING — MG DIGITAL SCREENING BILATERAL MAMMOGRAM WITH TOMO AND CAD
8 series · 8 of 24 positions shown · non-contrast
Comparison: Previous exam(s).

CLINICAL DATA: Screening.

EXAM:
DIGITAL SCREENING BILATERAL MAMMOGRAM WITH TOMO AND CAD

[R CC synth-2D]
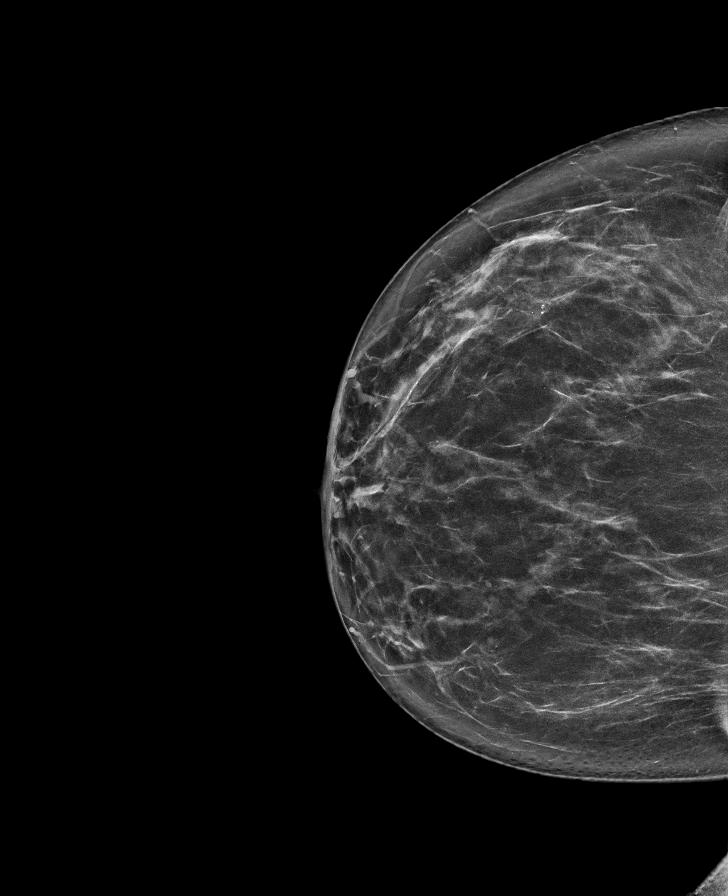

[L CC synth-2D]
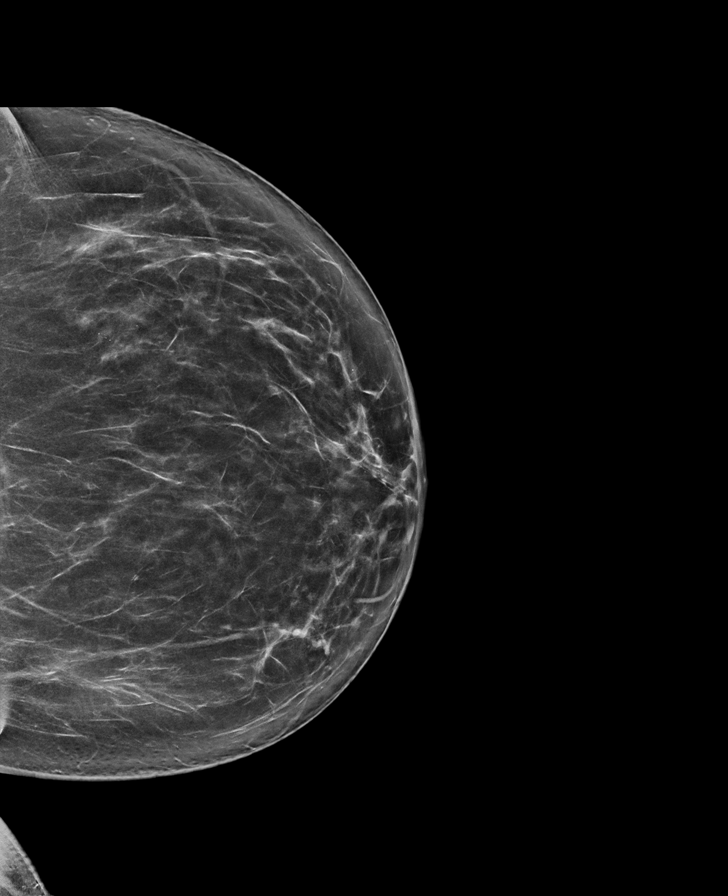

[L MLO synth-2D]
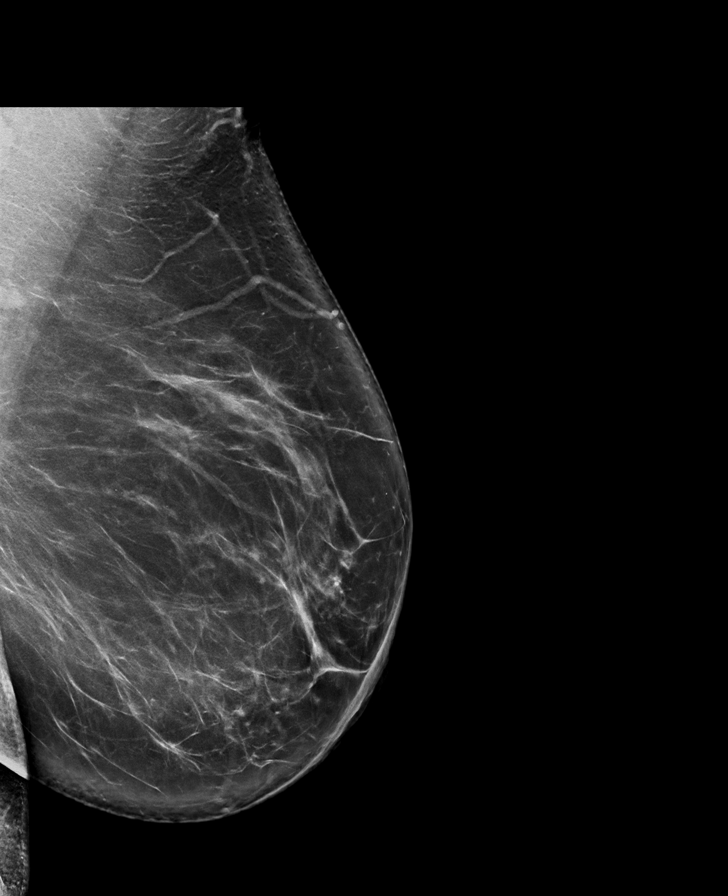

[R MLO synth-2D]
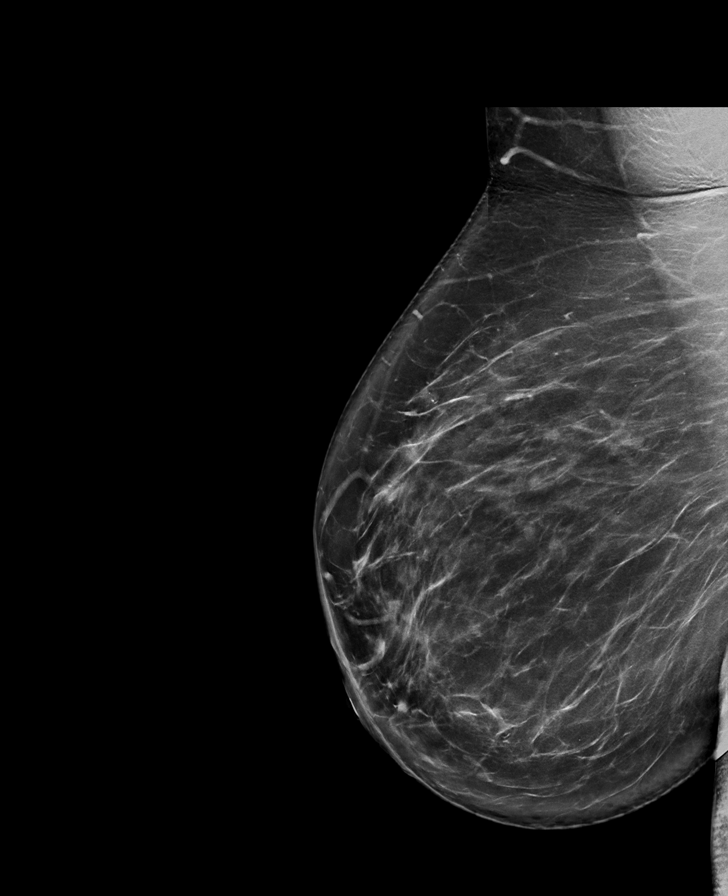

[R MLO tomo · tomo slice 47/92.0]
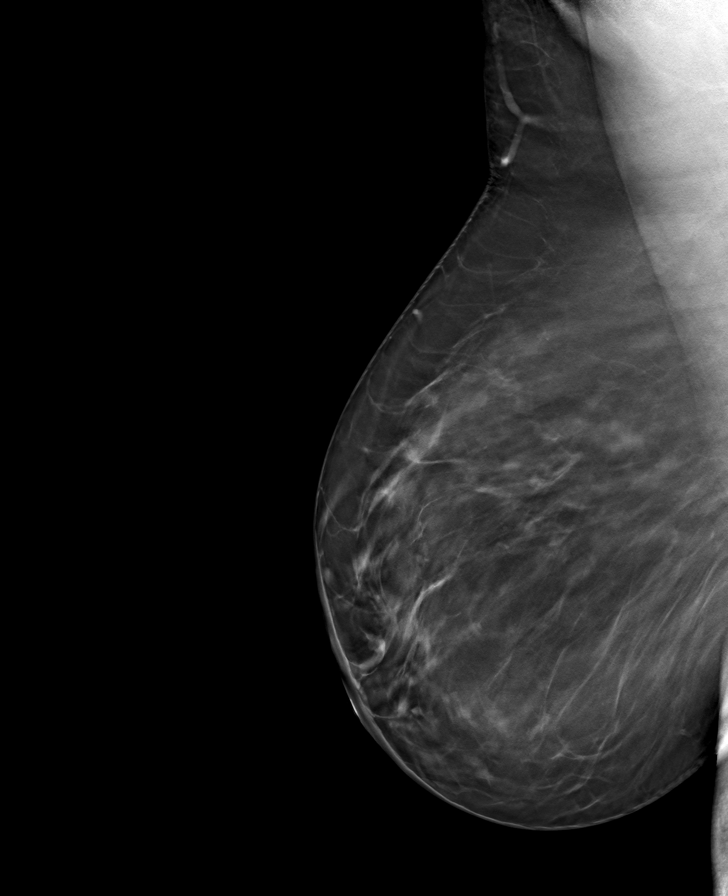

[R CC tomo · tomo slice 39/76.0]
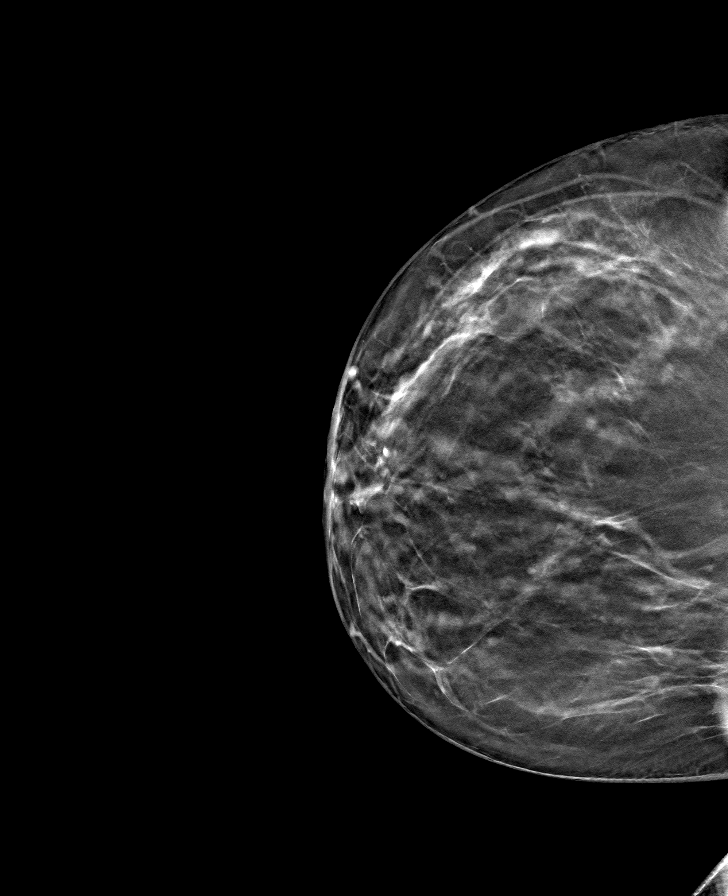

[L CC tomo · tomo slice 41/80.0]
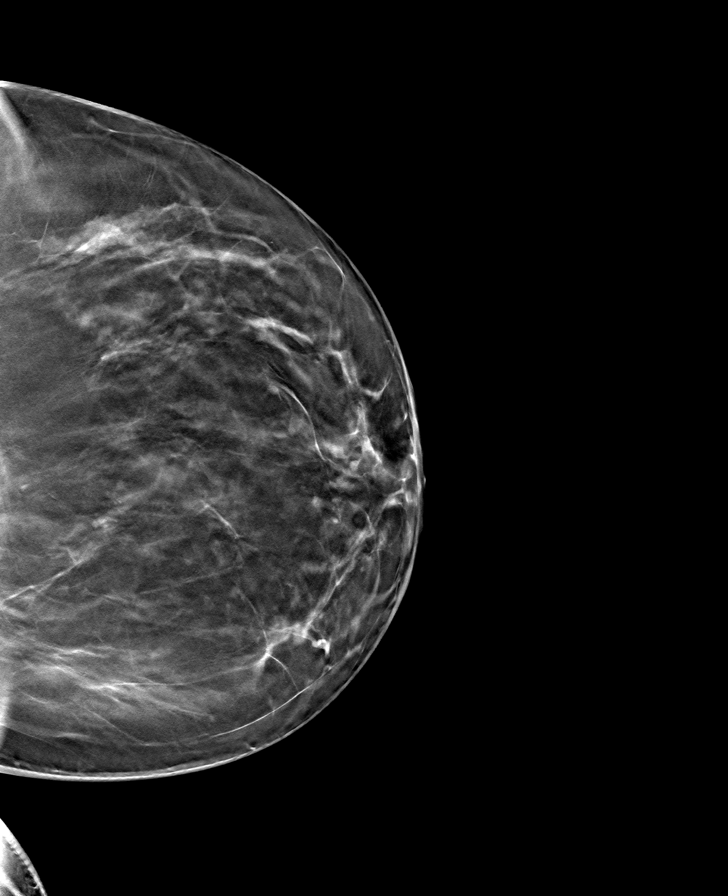

[L MLO tomo · tomo slice 50/99.0]
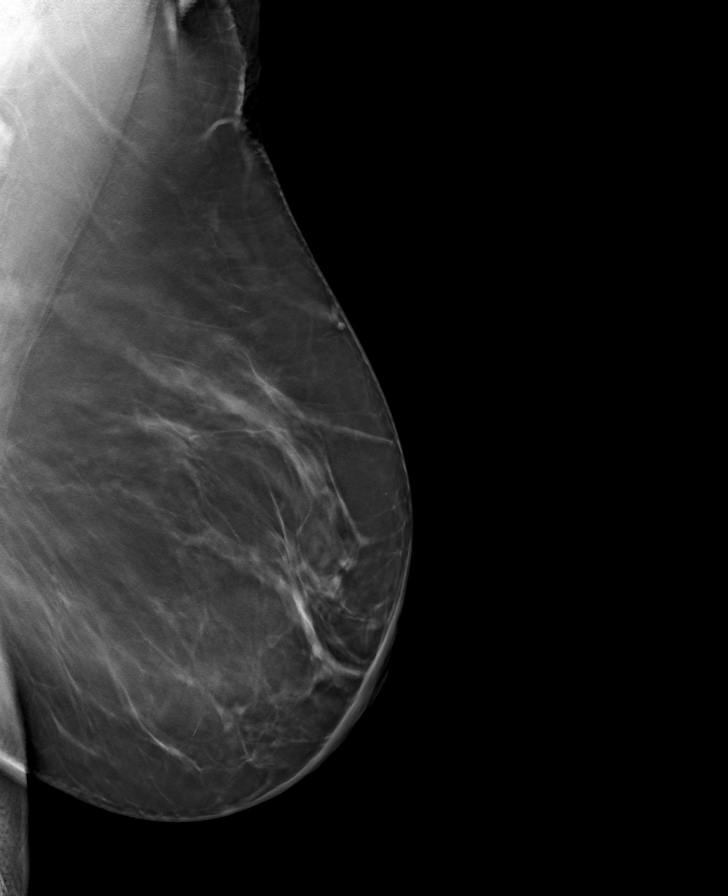

[8 of 24 positions shown; findings below may reference images not displayed]

ACR Breast Density Category b: There are scattered areas of
fibroglandular density.
FINDINGS: There are no findings suspicious for malignancy. Images were
processed with CAD.
IMPRESSION: No mammographic evidence of malignancy. A result letter of this
screening mammogram will be mailed directly to the patient.

RECOMMENDATION:
Screening mammogram in one year. (Code:[id number])

BI-RADS CATEGORY  1: Negative.

## 2020-01-12 ENCOUNTER — Encounter (INDEPENDENT_AMBULATORY_CARE_PROVIDER_SITE_OTHER): Payer: Self-pay | Admitting: Nurse Practitioner

## 2020-01-12 ENCOUNTER — Other Ambulatory Visit: Payer: Self-pay

## 2020-01-12 ENCOUNTER — Ambulatory Visit (INDEPENDENT_AMBULATORY_CARE_PROVIDER_SITE_OTHER): Payer: No Typology Code available for payment source | Admitting: Nurse Practitioner

## 2020-01-12 VITALS — BP 130/80 | HR 63 | Temp 98.3°F | Ht 64.0 in | Wt 232.6 lb

## 2020-01-12 DIAGNOSIS — E559 Vitamin D deficiency, unspecified: Secondary | ICD-10-CM | POA: Diagnosis not present

## 2020-01-12 DIAGNOSIS — Z131 Encounter for screening for diabetes mellitus: Secondary | ICD-10-CM

## 2020-01-12 DIAGNOSIS — Z0001 Encounter for general adult medical examination with abnormal findings: Secondary | ICD-10-CM | POA: Diagnosis not present

## 2020-01-12 DIAGNOSIS — E669 Obesity, unspecified: Secondary | ICD-10-CM | POA: Diagnosis not present

## 2020-01-12 DIAGNOSIS — Z139 Encounter for screening, unspecified: Secondary | ICD-10-CM

## 2020-01-12 DIAGNOSIS — Z13220 Encounter for screening for lipoid disorders: Secondary | ICD-10-CM

## 2020-01-12 DIAGNOSIS — R5383 Other fatigue: Secondary | ICD-10-CM

## 2020-01-12 DIAGNOSIS — R21 Rash and other nonspecific skin eruption: Secondary | ICD-10-CM | POA: Insufficient documentation

## 2020-01-12 DIAGNOSIS — Z1211 Encounter for screening for malignant neoplasm of colon: Secondary | ICD-10-CM

## 2020-01-12 NOTE — Patient Instructions (Signed)
Thank you for choosing [facility] as your medical provider! If you have any questions or concerns regarding your health care, please do not hesitate to call our office.  Continue your medications as prescribed.  I will notify you of your lab results and if we need to make any changes to medications.  If your rash returns please call this office as soon as it arrives so that we can assess it in person.  Please follow-up as scheduled in 3 months. We look forward to seeing you again soon!   At [facility] we value your feedback. You may receive a survey about your visit today. Please share your experience as we strive to create trusting relationships with our patients to provide genuine, compassionate, quality care.  We appreciate your understanding and patience as we review any laboratory studies, imaging, and other diagnostic tests that are ordered as we care for you. We do our best to address any and all results in a timely manner. If you do not hear about test results within 1 week, please do not hesitate to contact us. If we referred you to a specialist during your visit or ordered imaging testing, contact the office if you have not been contacted to be scheduled within 1 weeks.  We also encourage the use of MyChart, which contains your medical information for your review as well. If you are not enrolled in this feature, an access code is on this after visit summary for your convenience. Thank you for allowing [place] to be involved in your care.  '

## 2020-01-12 NOTE — Progress Notes (Signed)
Subjective:  Patient ID: Nancy Gilmore, female    DOB: 13-Jun-1966  Age: 54 y.o. MRN: [id number]  CC:  Chief Complaint  Patient presents with  . Annual Exam      HPI  This patient comes in today for the above.  Immunizations: She is due for tetanus, shingles, and Covid 19 vaccine.    Screenings: She is up-to-date with her Pap smear, mammogram, sexual transmitted infection screening, and hepatitis C screening.  She tells me that she sees her OB/GYN regularly and they monitor her for the screenings.  She tells me her hepatitis C screening was completed around 2018 and was negative.  She is due for depression screening and colon cancer screening.  Rash: She mentions to me that approximately 5 days ago the rash erupted on her mid back, bilateral arms, and bilateral hips.  She also experienced a mild fever and nasal drainage.  She tells me she gets tested for COVID-19 at least once a week sometimes more, for her job at a nursing home and last time she was checked she was negative.  She tells me it was red, slightly raised, and pruritic.  She does not note any new exposures to soaps, laundry detergents, foods, medications.  She did take an over-the-counter allergy pill and this improved her rash.  She tells me her rash is now disappearing on its own and she has not taken any medication to treat it for at least 4 days.   Past Medical History:  Diagnosis Date  . Cellulitis of leg, right   . Fibroids    uterine  . GERD (gastroesophageal reflux disease) 08/11/2019  . Obesity (BMI 30-39.9) 08/11/2019  . Vitamin D deficiency disease 08/11/2019      Family History  Problem Relation Age of Onset  . 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   Social History   Tobacco Use  . Smoking status: Former Smoker    Types: Cigarettes    Quit date: 11/12/2006    Years since quitting: 13.1  . Smokeless tobacco:  Never Used  Substance Use Topics  . Alcohol use: No     No outpatient medications have been marked as taking for the 01/12/20 encounter (Office Visit) with Ailene Ards, NP.    ROS:  Review of Systems  Constitutional: Negative for fever (resolved since 4 days ago), malaise/fatigue and weight loss.  Eyes: Negative for blurred vision and double vision.  Respiratory: Negative for cough, shortness of breath and wheezing.   Cardiovascular: Negative for chest pain and palpitations.  Skin: Positive for rash (resolving).  Neurological: Negative for dizziness and headaches.  Psychiatric/Behavioral: Negative for depression.     Objective:   Today's Vitals: BP 130/80 (BP Location: Right Arm, Patient Position: Sitting, Cuff Size: Normal)   Pulse 63   Temp 98.3 F (36.8 C) (Temporal)   Ht 5' 4" (1.626 m)   Wt 232 lb 9.6 oz (105.5 kg)   SpO2 97%   BMI 39.93 kg/m  Vitals with BMI 01/12/2020 08/11/2019 05/14/2019  Height 5' 4" 5' 5" -  Weight 232 lbs 10 oz 228 lbs 6 oz -  BMI 85.02 77.41 -  Systolic 287 867 672  Diastolic 80 80 66  Pulse 63 72 69     Physical Exam Vitals reviewed.  Constitutional:      Appearance: Normal appearance.  HENT:  Head: Normocephalic and atraumatic.     Right Ear: Tympanic membrane, ear canal and external ear normal.     Left Ear: Tympanic membrane, ear canal and external ear normal.  Eyes:     General:        Right eye: No discharge.        Left eye: No discharge.     Extraocular Movements: Extraocular movements intact.     Conjunctiva/sclera: Conjunctivae normal.     Pupils: Pupils are equal, round, and reactive to light.  Neck:     Vascular: No carotid bruit.  Cardiovascular:     Rate and Rhythm: Normal rate and regular rhythm.     Pulses: Normal pulses.     Heart sounds: Normal heart sounds. No murmur.  Pulmonary:     Effort: Pulmonary effort is normal.     Breath sounds: Normal breath sounds.  Chest:     Comments: Breast exam not  completed today per patient preference.  She tells me she gets this done with her OB/GYN. Abdominal:     General: Abdomen is flat. Bowel sounds are normal. There is no distension.     Palpations: Abdomen is soft. There is no mass.     Tenderness: There is no abdominal tenderness.  Musculoskeletal:        General: No tenderness.     Cervical back: Neck supple. No muscular tenderness.     Right lower leg: No edema.     Left lower leg: No edema.  Lymphadenopathy:     Cervical: No cervical adenopathy.     Upper Body:     Right upper body: No supraclavicular adenopathy.     Left upper body: No supraclavicular adenopathy.  Skin:    General: Skin is warm and dry.     Findings: Rash present. Rash is macular (macular rash noted to bilateral arms, redness, no tenderness, no swelling, no drainage. Rash is light pink in color. Per patient it is fading).  Neurological:     General: No focal deficit present.     Mental Status: She is alert and oriented to person, place, and time.     Motor: No weakness.     Gait: Gait normal.  Psychiatric:        Mood and Affect: Mood normal.        Behavior: Behavior normal.        Judgment: Judgment normal.          Office Visit from 01/12/2020 in [facility]  PHQ-2 Total Score  0       Assessment   1. Screen for colon cancer   2. Vitamin D deficiency disease   3. Obesity (BMI 30-39.9)   4. Encounter for general adult medical examination with abnormal findings   5. Fatigue, unspecified type   6. Screening, lipid   7. Diabetes mellitus screening   8. Screening for condition   9. Rash       Tests ordered Orders Placed This Encounter  Procedures  . Fecal Globin By Immunochemistry  . CBC  . CMP with eGFR([facility])  . Lipid Panel  . Vitamin D, 25-hydroxy  . Hemoglobin A1c  . TSH     Plan: 1. 4.-8.  We discussed screening options for colon cancer.  She would prefer to try FIT before undergoing colonoscopy, but she is willing to  undergo colonoscopy if her stool test comes back abnormal.  She will be provided with the kit today.  He will   hold off on administering any vaccines at this time per her preference.  We will collect blood work for further evaluation of her fatigue and to complete additional screenings. 2.  She will continue on her vitamin D3 supplement and we will collect serum level for further evaluation today. 3.  She was encouraged to focus on increasing her physical activity as well as following a healthy diet including participating in intermittent fasting which she has done in the past.  She tells me she will try these. 9.  Rash is resolving.  No red flag symptoms noted in review of systems.  I recommended that she let us know if the rash wraps again, but otherwise I did not recommend any further work-up today.   No orders of the defined types were placed in this encounter.   Patient to follow-up in 3 months  SARAH E GRAY, NP  

## 2020-01-13 LAB — COMPLETE METABOLIC PANEL WITH GFR
AG Ratio: 1.4 (calc) (ref 1.0–2.5)
ALT: 15 U/L (ref 6–29)
AST: 17 U/L (ref 10–35)
Albumin: 4.1 g/dL (ref 3.6–5.1)
Alkaline phosphatase (APISO): 48 U/L (ref 37–153)
BUN: 13 mg/dL (ref 7–25)
CO2: 28 mmol/L (ref 20–32)
Calcium: 9.6 mg/dL (ref 8.6–10.4)
Chloride: 102 mmol/L (ref 98–110)
Creat: 0.79 mg/dL (ref 0.50–1.05)
GFR, Est African American: 98 mL/min/{1.73_m2} (ref >=60)
GFR, Est Non African American: 85 mL/min/{1.73_m2} (ref >=60)
Globulin: 2.9 g/dL (calc) (ref 1.9–3.7)
Glucose, Bld: 80 mg/dL (ref 65–99)
Potassium: 3.9 mmol/L (ref 3.5–5.3)
Sodium: 140 mmol/L (ref 135–146)
Total Bilirubin: 0.4 mg/dL (ref 0.2–1.2)
Total Protein: 7 g/dL (ref 6.1–8.1)

## 2020-01-13 LAB — CBC
HCT: 37.4 % (ref 35.0–45.0)
Hemoglobin: 12.5 g/dL (ref 11.7–15.5)
MCH: 30.5 pg (ref 27.0–33.0)
MCHC: 33.4 g/dL (ref 32.0–36.0)
MCV: 91.2 fL (ref 80.0–100.0)
MPV: 9.8 fL (ref 7.5–12.5)
Platelets: 296 10*3/uL (ref 140–400)
RBC: 4.1 10*6/uL (ref 3.80–5.10)
RDW: 11.9 % (ref 11.0–15.0)
WBC: 7.3 10*3/uL (ref 3.8–10.8)

## 2020-01-13 LAB — TSH: TSH: 1.2 mIU/L

## 2020-01-13 LAB — HEMOGLOBIN A1C
Hgb A1c MFr Bld: 5.4 % of total Hgb (ref <5.7)
Mean Plasma Glucose: 108 (calc)
eAG (mmol/L): 6 (calc)

## 2020-01-13 LAB — VITAMIN D 25 HYDROXY (VIT D DEFICIENCY, FRACTURES): Vit D, 25-Hydroxy: 42 ng/mL (ref 30–100)

## 2020-01-13 LAB — LIPID PANEL
Cholesterol: 170 mg/dL (ref <200)
HDL: 57 mg/dL (ref >=50)
LDL Cholesterol (Calc): 95 mg/dL (calc)
Non-HDL Cholesterol (Calc): 113 mg/dL (calc) (ref <130)
Total CHOL/HDL Ratio: 3 (calc) (ref <5.0)
Triglycerides: 87 mg/dL (ref <150)

## 2020-01-20 MED FILL — ESTRADIOL 0.1 MG/24HR PTTW: 0.1 | 84 days supply | Qty: 24 | Fill #0

## 2020-01-22 ENCOUNTER — Other Ambulatory Visit (HOSPITAL_COMMUNITY): Payer: Self-pay | Admitting: Obstetrics and Gynecology

## 2020-04-05 ENCOUNTER — Telehealth (INDEPENDENT_AMBULATORY_CARE_PROVIDER_SITE_OTHER): Payer: Self-pay

## 2020-04-05 NOTE — Telephone Encounter (Signed)
I do not believe he needs a referral from [place].  She can make her own appointment.  Her insurance should be okay with this.  She is a Adult nurse.

## 2020-04-05 NOTE — Telephone Encounter (Signed)
Return call and patient will make the appt via phone call. Left message and instructions.

## 2020-04-07 MED FILL — ESTRADIOL 0.1 MG/24HR PTTW: 0.1 | 84 days supply | Qty: 24 | Fill #1

## 2020-04-14 ENCOUNTER — Ambulatory Visit (INDEPENDENT_AMBULATORY_CARE_PROVIDER_SITE_OTHER): Payer: No Typology Code available for payment source | Admitting: Internal Medicine

## 2020-04-14 ENCOUNTER — Other Ambulatory Visit: Payer: Self-pay

## 2020-04-14 ENCOUNTER — Encounter (INDEPENDENT_AMBULATORY_CARE_PROVIDER_SITE_OTHER): Payer: Self-pay | Admitting: Internal Medicine

## 2020-04-14 DIAGNOSIS — E8941 Symptomatic postprocedural ovarian failure: Secondary | ICD-10-CM | POA: Diagnosis not present

## 2020-04-14 DIAGNOSIS — E559 Vitamin D deficiency, unspecified: Secondary | ICD-10-CM | POA: Diagnosis not present

## 2020-04-14 NOTE — Progress Notes (Signed)
Metrics: Intervention Frequency ACO  Documented Smoking Status Yearly  Screened one or more times in 24 months  Cessation Counseling or  Active cessation medication Past 24 months  Past 24 months   Guideline developer: UpToDate (See UpToDate for funding source) Date Released: 2014       Wellness Office Visit  Subjective:  Patient ID: Nancy Gilmore, female    DOB: 12-Jan-1966  Age: 54 y.o. MRN: [id number]  CC: This lady comes in for follow-up of her obesity, vitamin D deficiency and menopausal symptoms. HPI On the last visit that I saw her in September, I prescribed her estradiol cream but she is no longer taking this.  She tells me that her hot flashes are really not bothering her too much. She is taking vitamin D3 5000 units daily and vitamin D levels checked in March are still suboptimal. Her mother had a stroke in February and this is prompted her to become healthier herself.  As a result, through the [facility], she is going to see [facility] in [place].  She has an appointment coming up this week. She seems to be motivated on this occasion to lose weight.  In fact, this morning, she was exercising at 5 AM in the morning.  Past Medical History:  Diagnosis Date  . Cellulitis of leg, right   . Fibroids    uterine  . GERD (gastroesophageal reflux disease) 08/11/2019  . Obesity (BMI 30-39.9) 08/11/2019  . Vitamin D deficiency disease 08/11/2019   Past Surgical History:  Procedure Laterality Date  . ABDOMINAL HYSTERECTOMY     TAH&BSO Age 1 yrs     Family History  Problem Relation Age of Onset  . 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   Social History   Tobacco Use  . Smoking status: Former Smoker    Types: Cigarettes    Quit date: 11/12/2006    Years since quitting: 13.4  . Smokeless tobacco: Never Used  Substance Use Topics  . Alcohol use: No     Current Meds  Medication Sig  . Cholecalciferol (VITAMIN D-3) 125 MCG (5000 UT) TABS Take 1 tablet by mouth daily.  . [DISCONTINUED] estradiol (VIVELLE-DOT) 0.1 MG/24HR patch        Depression screen [facility] 2/9 01/12/2020  Decreased Interest 0  Down, Depressed, Hopeless 0  PHQ - 2 Score 0     Objective:   Today's Vitals: BP 120/80 (BP Location: Left Arm, Patient Position: Sitting, Cuff Size: Normal)   Pulse 61   Temp 97.6 F (36.4 C) (Temporal)   Ht 5\' 4"  (1.626 m)   Wt 238 lb 9.6 oz (108.2 kg)   SpO2 96%   BMI 40.96 kg/m  Vitals with BMI 04/14/2020 01/12/2020 08/11/2019  Height 5\' 4"  5\' 4"  5\' 5"   Weight 238 lbs 10 oz 232 lbs 10 oz 228 lbs 6 oz  BMI 40.94 0000000 [id number]  Systolic [id number]  Diastolic 80 80 80  Pulse 61 63 72     Physical Exam   She looks systemically well.  She has gained 6 pounds since the last time she was seen in the office.  Blood pressure is excellent.  Alert and orientated.    Assessment   1. Morbid obesity ([place])   2. Hot flashes due to surgical menopause   3. Vitamin D deficiency disease  Tests ordered No orders of the defined types were placed in this encounter.    Plan: 1. She will follow up with bariatric/obesity medicine in [place] to see if they can help her lose weight. 2. As far as her menopausal symptoms are concerned, these are not a major concern to her at the present time. 3. As far as her vitamin D deficiency is concerned, I have recommended that she increase the dose of vitamin D3 to 10,000 units daily as her vitamin D level was suboptimal. 4. She will follow-up with Judson Roch in about 3 to 4 months time.   No orders of the defined types were placed in this encounter.   Doree Albee, MD

## 2020-04-20 ENCOUNTER — Other Ambulatory Visit: Payer: Self-pay

## 2020-04-20 ENCOUNTER — Encounter (INDEPENDENT_AMBULATORY_CARE_PROVIDER_SITE_OTHER): Payer: Self-pay | Admitting: Family Medicine

## 2020-04-20 ENCOUNTER — Ambulatory Visit (INDEPENDENT_AMBULATORY_CARE_PROVIDER_SITE_OTHER): Payer: No Typology Code available for payment source | Admitting: Family Medicine

## 2020-04-20 VITALS — BP 119/80 | HR 59 | Temp 98.1°F | Ht 64.0 in | Wt 236.0 lb

## 2020-04-20 DIAGNOSIS — R0602 Shortness of breath: Secondary | ICD-10-CM

## 2020-04-20 DIAGNOSIS — Z9189 Other specified personal risk factors, not elsewhere classified: Secondary | ICD-10-CM | POA: Diagnosis not present

## 2020-04-20 DIAGNOSIS — K760 Fatty (change of) liver, not elsewhere classified: Secondary | ICD-10-CM

## 2020-04-20 DIAGNOSIS — R5383 Other fatigue: Secondary | ICD-10-CM

## 2020-04-20 DIAGNOSIS — Z6841 Body Mass Index (BMI) 40.0 and over, adult: Secondary | ICD-10-CM

## 2020-04-20 DIAGNOSIS — F3289 Other specified depressive episodes: Secondary | ICD-10-CM

## 2020-04-20 DIAGNOSIS — Z0289 Encounter for other administrative examinations: Secondary | ICD-10-CM

## 2020-04-20 DIAGNOSIS — E559 Vitamin D deficiency, unspecified: Secondary | ICD-10-CM

## 2020-04-20 NOTE — Progress Notes (Signed)
Chief Complaint:   OBESITY Nancy Gilmore (MR# [id number]) is a 54 y.o. female who presents for evaluation and treatment of obesity and related comorbidities. Current BMI is Body mass index is 40.51 kg/m.Nancy Gilmore has been struggling with her weight for many years and has been unsuccessful in either losing weight, maintaining weight loss, or reaching her healthy weight goal.  Nancy Gilmore is currently in the action stage of change and ready to dedicate time achieving and maintaining a healthier weight. Nancy Gilmore is interested in becoming our patient and working on intensive lifestyle modifications including (but not limited to) diet and exercise for weight loss.  Nancy Gilmore heard about our clinic from [facility] from [facility]. She has previously tried intermittent fasting, but has regained weight loss. She has coffee in the a.m. with 2% milk; ~10:00 a.m. she has 1 banana if getting hungry; 12:00 p.m. lunch is stew or salad +/- orange snack at home only occasionally; Dinner is a grilled cheese sandwich; after dinner snack is 5 or 6 cookies or candy.  Nancy Gilmore's habits were reviewed today and are as follows: her desired weight loss is 66 lbs, she has been heavy most of her life, she started gaining weight after birth of her children/hysterectomy, her heaviest weight ever was 272 pounds, she craves sweets - desserts/cookies, she snacks frequently in the evenings, she skips breakfast sometimes 2 times per week, she frequently makes poor food choices, she frequently eats larger portions than normal with sweets/snacks, she has binge eating behaviors and she struggles with emotional eating.  Depression Screen Nancy Gilmore's Food and Mood (modified PHQ-9) score was 13.  Depression screen PHQ 2/9 04/20/2020  Decreased Interest 1  Down, Depressed, Hopeless 3  PHQ - 2 Score 4  Altered sleeping 0  Tired, decreased energy 3  Change in appetite 3  Feeling bad or failure about yourself  1  Trouble concentrating 2    Moving slowly or fidgety/restless 0  Suicidal thoughts 0  PHQ-9 Score 13  Difficult doing work/chores Somewhat difficult   Subjective:   Other fatigue. Nancy Gilmore denies daytime somnolence and denies waking up still tired. Nancy Gilmore generally gets 6 hours of sleep per night, and states that she has generally restful sleep. Snoring is present. Apneic episodes are not present. Epworth Sleepiness Score is 2.  SOB (shortness of breath) on exertion. Nancy Gilmore notes increasing shortness of breath with exercising and seems to be worsening over time with weight gain. She notes getting out of breath sooner with activity than she used to. This has gotten worse recently. [name] denies shortness of breath at rest or orthopnea.  Vitamin D deficiency. No nausea, vomiting, or muscle weakness, but she endorses fatigue. Nancy Gilmore is on Vitamin D 10,000 IU daily. Last Vitamin D was 42 on 01/12/2020.  NAFLD (nonalcoholic fatty liver disease). Ultrasound in 2017 showed diffuse echogenicity.  Other depression, with emotional eating. Nancy Gilmore is struggling with emotional eating and using food for comfort to the extent that it is negatively impacting her health. She has been working on behavior modification techniques to help reduce her emotional eating and has been somewhat successful. She shows no sign of suicidal or homicidal ideations. Nancy Gilmore endorses significant frequent emotional eating, which has increased since her mother had a stroke in February.  At risk for impaired function of liver. Nancy Gilmore is at risk for impaired function of liver due to likely diagnosis of fatty liver as evidenced by recent elevated liver enzymes.   Assessment/Plan:   Other fatigue. Nancy Gilmore  does feel that her weight is causing her energy to be lower than it should be. Fatigue may be related to obesity, depression or many other causes. Labs will be ordered, and in the meanwhile, Nancy Gilmore will focus on self care including making healthy food  choices, increasing physical activity and focusing on stress reduction. EKG 12-Lead showed sinus bradycardia. Comprehensive metabolic panel, CBC with Differential/Platelet, Hemoglobin A1c, Insulin, random, Lipid Panel With LDL/HDL Ratio, Vitamin B12, Folate, T3, T4, TSH labs ordered today.  SOB (shortness of breath) on exertion. Nancy Gilmore does feel that she gets out of breath more easily that she used to when she exercises. Nancy Gilmore's shortness of breath appears to be obesity related and exercise induced. She has agreed to work on weight loss and gradually increase exercise to treat her exercise induced shortness of breath. Will continue to monitor closely. Comprehensive metabolic panel, CBC with Differential/Platelet, Hemoglobin A1c, Insulin, random, Lipid Panel With LDL/HDL Ratio, Vitamin B12, Folate, T3, T4, TSH labs ordered today.  Vitamin D deficiency. Low Vitamin D level contributes to fatigue and are associated with obesity, breast, and colon cancer. VITAMIN D 25 Hydroxy (Vit-D Deficiency, Fractures) level ordered today.  NAFLD (nonalcoholic fatty liver disease). Comprehensive metabolic panel, Lipid Panel With LDL/HDL Ratio labs were ordered today.  Other depression, with emotional eating. Behavior modification techniques were discussed today to help Nancy Gilmore deal with her emotional/non-hunger eating behaviors.  Orders and follow up as documented in patient record. Will defer referral to Dr. Mallie Mussel at this time.  At risk for impaired function of liver. Nancy Gilmore was given approximately 15 minutes of counseling today regarding prevention of impaired liver function. Nancy Gilmore was educated about her risk of developing NASH or even liver failure and advised that the only proven treatment for NAFLD was weight loss of at least 5-10% of body weight.   Class 3 severe obesity with serious comorbidity and body mass index (BMI) of 40.0 to 44.9 in adult, unspecified obesity type (Nancy Gilmore).  Nancy Gilmore is currently in the  action stage of change and her goal is to continue with weight loss efforts. I recommend Nancy Gilmore begin the structured treatment plan as follows:  She has agreed to the Category 2 Plan + 100 calories and 6 oz at dinner.  Exercise goals: No exercise has been prescribed at this time.   Behavioral modification strategies: increasing lean protein intake, increasing vegetables, meal planning and cooking strategies, keeping healthy foods in the home and planning for success.  She was informed of the importance of frequent follow-up visits to maximize her success with intensive lifestyle modifications for her multiple health conditions. She was informed we would discuss her lab results at her next visit unless there is a critical issue that needs to be addressed sooner. Nancy Gilmore agreed to keep her next visit at the agreed upon time to discuss these results.  Objective:   Blood pressure 119/80, pulse (!) 59, temperature 98.1 F (36.7 C), temperature source Oral, height 5\' 4"  (1.626 m), weight 236 lb (107 kg), SpO2 98 %. Body mass index is 40.51 kg/m.  EKG: Sinus  Bradycardia with a rate of 59 BPM. Low voltage in precordial leads. Abnormal.  Indirect Calorimeter completed today shows a VO2 of 229 and a REE of 1594.  Her calculated basal metabolic rate is 3235 thus her basal metabolic rate is worse than expected.  General: Cooperative, alert, well developed, in no acute distress. HEENT: Conjunctivae and lids unremarkable. Cardiovascular: II/VI systolic murmur heard best at the aortic area.  Lungs:  Normal work of breathing. Neurologic: No focal deficits.   Lab Results  Component Value Date   CREATININE 0.79 01/12/2020   BUN 13 01/12/2020   NA 140 01/12/2020   K 3.9 01/12/2020   CL 102 01/12/2020   CO2 28 01/12/2020   Lab Results  Component Value Date   ALT 15 01/12/2020   AST 17 01/12/2020   ALKPHOS 50 12/15/2009   BILITOT 0.4 01/12/2020   Lab Results  Component Value Date   HGBA1C 5.4  01/12/2020   HGBA1C  12/15/2009    5.5 (NOTE) The ADA recommends the following therapeutic goal for glycemic control related to Hgb A1c measurement: Goal of therapy: <6.5 Hgb A1c  Reference: American Diabetes Association: Clinical Practice Recommendations 2010, Diabetes Care, 2010, 33: (Suppl  1).   No results found for: INSULIN Lab Results  Component Value Date   TSH 1.20 01/12/2020   Lab Results  Component Value Date   CHOL 170 01/12/2020   HDL 57 01/12/2020   LDLCALC 95 01/12/2020   TRIG 87 01/12/2020   CHOLHDL 3.0 01/12/2020   Lab Results  Component Value Date   WBC 7.3 01/12/2020   HGB 12.5 01/12/2020   HCT 37.4 01/12/2020   MCV 91.2 01/12/2020   PLT 296 01/12/2020   No results found for: IRON, TIBC, FERRITIN  Attestation Statements:   Reviewed by clinician on day of visit: allergies, medications, problem list, medical history, surgical history, family history, social history, and previous encounter notes.  This is the patient's first visit at [facility]. The patient's NEW PATIENT PACKET was reviewed at length. Included in the packet: current and past health history, medications, allergies, ROS, gynecologic history (women only), surgical history, family history, social history, weight history, weight loss surgery history (for those that have had weight loss surgery), nutritional evaluation, mood and food questionnaire, PHQ9, Epworth questionnaire, sleep habits questionnaire, patient life and health improvement goals questionnaire. These will all be scanned into the patient's chart under media.   During the visit, I independently reviewed the patient's EKG, bioimpedance scale results, and indirect calorimeter results. I used this information to tailor a meal plan for the patient that will help her to lose weight and will improve her obesity-related conditions going forward. I performed a medically necessary appropriate examination and/or evaluation. I discussed  the assessment and treatment plan with the patient. The patient was provided an opportunity to ask questions and all were answered. The patient agreed with the plan and demonstrated an understanding of the instructions. Labs were ordered at this visit and will be reviewed at the next visit unless more critical results need to be addressed immediately. Clinical information was updated and documented in the EMR.   Time spent on visit including pre-visit chart review and post-visit care was 45 minutes.   A separate 15 minutes was spent on risk counseling (see above).    I, Michaelene Song, am acting as transcriptionist for Coralie Common, MD   I have reviewed the above documentation for accuracy and completeness, and I agree with the above. - Jinny Blossom, MD

## 2020-04-21 LAB — CBC WITH DIFFERENTIAL/PLATELET
Basophils Absolute: 0.1 10*3/uL (ref 0.0–0.2)
Basos: 1 %
EOS (ABSOLUTE): 0.4 10*3/uL (ref 0.0–0.4)
Eos: 6 %
Hematocrit: 39.2 % (ref 34.0–46.6)
Hemoglobin: 12.6 g/dL (ref 11.1–15.9)
Immature Grans (Abs): 0 10*3/uL (ref 0.0–0.1)
Immature Granulocytes: 0 %
Lymphocytes Absolute: 2.2 10*3/uL (ref 0.7–3.1)
Lymphs: 34 %
MCH: 29.6 pg (ref 26.6–33.0)
MCHC: 32.1 g/dL (ref 31.5–35.7)
MCV: 92 fL (ref 79–97)
Monocytes Absolute: 0.5 10*3/uL (ref 0.1–0.9)
Monocytes: 8 %
Neutrophils Absolute: 3.3 10*3/uL (ref 1.4–7.0)
Neutrophils: 51 %
Platelets: 236 10*3/uL (ref 150–450)
RBC: 4.25 x10E6/uL (ref 3.77–5.28)
RDW: 13.3 % (ref 11.7–15.4)
WBC: 6.4 10*3/uL (ref 3.4–10.8)

## 2020-04-21 LAB — COMPREHENSIVE METABOLIC PANEL
ALT: 16 IU/L (ref 0–32)
AST: 21 IU/L (ref 0–40)
Albumin/Globulin Ratio: 1.8 (ref 1.2–2.2)
Albumin: 4.6 g/dL (ref 3.8–4.9)
Alkaline Phosphatase: 68 IU/L (ref 48–121)
BUN/Creatinine Ratio: 17 (ref 9–23)
BUN: 12 mg/dL (ref 6–24)
Bilirubin Total: 0.3 mg/dL (ref 0.0–1.2)
CO2: 23 mmol/L (ref 20–29)
Calcium: 9.3 mg/dL (ref 8.7–10.2)
Chloride: 102 mmol/L (ref 96–106)
Creatinine, Ser: 0.71 mg/dL (ref 0.57–1.00)
GFR calc Af Amer: 112 mL/min/{1.73_m2} (ref >59)
GFR calc non Af Amer: 97 mL/min/{1.73_m2} (ref >59)
Globulin, Total: 2.6 g/dL (ref 1.5–4.5)
Glucose: 88 mg/dL (ref 65–99)
Potassium: 4.5 mmol/L (ref 3.5–5.2)
Sodium: 141 mmol/L (ref 134–144)
Total Protein: 7.2 g/dL (ref 6.0–8.5)

## 2020-04-21 LAB — HEMOGLOBIN A1C
Est. average glucose Bld gHb Est-mCnc: 108 mg/dL
Hgb A1c MFr Bld: 5.4 % (ref 4.8–5.6)

## 2020-04-21 LAB — T3: T3, Total: 122 ng/dL (ref 71–180)

## 2020-04-21 LAB — TSH: TSH: 1.35 u[IU]/mL (ref 0.450–4.500)

## 2020-04-21 LAB — LIPID PANEL WITH LDL/HDL RATIO
Cholesterol, Total: 178 mg/dL (ref 100–199)
HDL: 87 mg/dL (ref >39)
LDL Chol Calc (NIH): 80 mg/dL (ref 0–99)
LDL/HDL Ratio: 0.9 ratio (ref 0.0–3.2)
Triglycerides: 57 mg/dL (ref 0–149)
VLDL Cholesterol Cal: 11 mg/dL (ref 5–40)

## 2020-04-21 LAB — VITAMIN B12: Vitamin B-12: 234 pg/mL (ref 232–1245)

## 2020-04-21 LAB — FOLATE: Folate: 9.8 ng/mL (ref >3.0)

## 2020-04-21 LAB — INSULIN, RANDOM: INSULIN: 18.1 u[IU]/mL (ref 2.6–24.9)

## 2020-04-21 LAB — VITAMIN D 25 HYDROXY (VIT D DEFICIENCY, FRACTURES): Vit D, 25-Hydroxy: 37 ng/mL (ref 30.0–100.0)

## 2020-04-21 LAB — T4: T4, Total: 7.4 ug/dL (ref 4.5–12.0)

## 2020-05-04 ENCOUNTER — Ambulatory Visit (INDEPENDENT_AMBULATORY_CARE_PROVIDER_SITE_OTHER): Payer: No Typology Code available for payment source | Admitting: Family Medicine

## 2020-05-04 ENCOUNTER — Encounter (INDEPENDENT_AMBULATORY_CARE_PROVIDER_SITE_OTHER): Payer: Self-pay | Admitting: Family Medicine

## 2020-05-04 ENCOUNTER — Other Ambulatory Visit: Payer: Self-pay

## 2020-05-04 VITALS — BP 138/78 | HR 53 | Temp 97.8°F | Ht 64.0 in | Wt 234.0 lb

## 2020-05-04 DIAGNOSIS — E8881 Metabolic syndrome: Secondary | ICD-10-CM | POA: Diagnosis not present

## 2020-05-04 DIAGNOSIS — Z9189 Other specified personal risk factors, not elsewhere classified: Secondary | ICD-10-CM | POA: Diagnosis not present

## 2020-05-04 DIAGNOSIS — E559 Vitamin D deficiency, unspecified: Secondary | ICD-10-CM | POA: Diagnosis not present

## 2020-05-04 DIAGNOSIS — Z6841 Body Mass Index (BMI) 40.0 and over, adult: Secondary | ICD-10-CM

## 2020-05-04 MED ORDER — VITAMIN D (ERGOCALCIFEROL) 1.25 MG (50000 UNIT) PO CAPS: 50000.0000 [IU] | ORAL_CAPSULE | ORAL | 0 refills | Status: DC

## 2020-05-04 MED FILL — VIT D2 1.25 MG (50,000 UNIT: 1.25 MG | 28 days supply | Qty: 4 | Fill #0

## 2020-05-05 NOTE — Progress Notes (Signed)
Chief Complaint:   OBESITY Nancy Gilmore is here to discuss her progress with her obesity treatment plan along with follow-up of her obesity related diagnoses. Nancy Gilmore is on the Category 2 Plan + 100 calories with 6 oz of protein at dinner and states she is following her eating plan approximately 75% of the time. Nancy Gilmore states she is doing yoga and Body pump for 60 minutes 1 time per week.  Today's visit was #: 2 Starting weight: 236 lbs Starting date: 04/20/2020 Today's weight: 234 lbs Today's date: 05/04/2020 Total lbs lost to date: 2 Total lbs lost since last in-office visit: 2  Interim History: Nancy Gilmore bought food for the plan and for the most part she followed the plan, but she had a few big events that had a good amount of indulgent food. She found the quantity of food to be enough.  Subjective:   1. Insulin resistance Nancy Gilmore's A1c is 5.4 and insulin 18.1. She still notes some cravings for sweets. She is not on medications. She is still experiencing some cravings and emotional eating tendencies. I discussed labs with the patient today.  2. Vitamin D deficiency Nancy Gilmore is on 5,000 IU daily, and she denies nausea, vomiting, or muscle weakness but notes fatigue. I discussed labs with the patient today.  3. At risk for osteoporosis Nancy Gilmore is at higher risk of osteopenia and osteoporosis due to Vitamin D deficiency.   Assessment/Plan:   1. Insulin resistance Nancy Gilmore will continue to work on weight loss, exercise, and decreasing simple carbohydrates to help decrease the risk of diabetes. We will follow up at her next appointment, and we will repeat labs in 3 months. Nancy Gilmore agreed to follow-up with [place] as directed to closely monitor her progress.  2. Vitamin D deficiency Low Vitamin D level contributes to fatigue and are associated with obesity, breast, and colon cancer. Nancy Gilmore agreed to start prescription Vitamin D 50,000 IU every week with no refills. She will follow-up for  routine testing of Vitamin D, at least 2-3 times per year to avoid over-replacement.  - Vitamin D, Ergocalciferol, (DRISDOL) 1.25 MG (50000 UNIT) CAPS capsule; Take 1 capsule (50,000 Units total) by mouth every 7 (seven) days.  Dispense: 4 capsule; Refill: 0  3. At risk for osteoporosis Nancy Gilmore was given approximately 30 minutes of osteoporosis prevention counseling today. Nancy Gilmore is at risk for osteopenia and osteoporosis due to her Vitamin D deficiency. She was encouraged to take her Vitamin D and follow her higher calcium diet and increase strengthening exercise to help strengthen her bones and decrease her risk of osteopenia and osteoporosis.  Repetitive spaced learning was employed today to elicit superior memory formation and behavioral change.  4. Class 3 severe obesity with serious comorbidity and body mass index (BMI) of 40.0 to 44.9 in adult, unspecified obesity type Nancy Gilmore) Nancy Gilmore is currently in the action stage of change. As such, her goal is to continue with weight loss efforts. She has agreed to the Category 2 Plan + 100 calories with 6 oz of protein at dinner.   Exercise goals: As is.  Behavioral modification strategies: increasing lean protein intake, meal planning and cooking strategies, keeping healthy foods in the home and planning for success.  Nancy Gilmore has agreed to follow-up with our clinic in 2 weeks. She was informed of the importance of frequent follow-up visits to maximize her success with intensive lifestyle modifications for her multiple health conditions.   Objective:   Blood pressure 138/78, pulse (!) 53, temperature 97.8 F (  36.6 C), temperature source Oral, height 5\' 4"  (1.626 m), weight 234 lb (106.1 kg), SpO2 99 %. Body mass index is 40.17 kg/m.  General: Cooperative, alert, well developed, in no acute distress. HEENT: Conjunctivae and lids unremarkable. Cardiovascular: Regular rhythm.  Lungs: Normal work of breathing. Neurologic: No focal deficits.    Lab Results  Component Value Date   CREATININE 0.71 04/20/2020   BUN 12 04/20/2020   NA 141 04/20/2020   K 4.5 04/20/2020   CL 102 04/20/2020   CO2 23 04/20/2020   Lab Results  Component Value Date   ALT 16 04/20/2020   AST 21 04/20/2020   ALKPHOS 68 04/20/2020   BILITOT 0.3 04/20/2020   Lab Results  Component Value Date   HGBA1C 5.4 04/20/2020   HGBA1C 5.4 01/12/2020   HGBA1C  12/15/2009    5.5 (NOTE) The ADA recommends the following therapeutic goal for glycemic control related to Hgb A1c measurement: Goal of therapy: <6.5 Hgb A1c  Reference: American Diabetes Association: Clinical Practice Recommendations 2010, Diabetes Care, 2010, 33: (Suppl  1).   Lab Results  Component Value Date   INSULIN 18.1 04/20/2020   Lab Results  Component Value Date   TSH 1.350 04/20/2020   Lab Results  Component Value Date   CHOL 178 04/20/2020   HDL 87 04/20/2020   LDLCALC 80 04/20/2020   TRIG 57 04/20/2020   CHOLHDL 3.0 01/12/2020   Lab Results  Component Value Date   WBC 6.4 04/20/2020   HGB 12.6 04/20/2020   HCT 39.2 04/20/2020   MCV 92 04/20/2020   PLT 236 04/20/2020   No results found for: IRON, TIBC, FERRITIN  Attestation Statements:   This is the patient's first visit at [facility]. The patient's NEW PATIENT PACKET was reviewed at length. Included in the packet: current and past health history, medications, allergies, ROS, gynecologic history (women only), surgical history, family history, social history, weight history, weight loss surgery history (for those that have had weight loss surgery), nutritional evaluation, mood and food questionnaire, PHQ9, Epworth questionnaire, sleep habits questionnaire, patient life and health improvement goals questionnaire. These will all be scanned into the patient's chart under media.   During the visit, I independently reviewed the patient's EKG, bioimpedance scale results, and indirect calorimeter results. I used  this information to tailor a meal plan for the patient that will help her to lose weight and will improve her obesity-related conditions going forward. I performed a medically necessary appropriate examination and/or evaluation. I discussed the assessment and treatment plan with the patient. The patient was provided an opportunity to ask questions and all were answered. The patient agreed with the plan and demonstrated an understanding of the instructions. Labs were ordered at this visit and will be reviewed at the next visit unless more critical results need to be addressed immediately. Clinical information was updated and documented in the EMR.   Time spent on visit including pre-visit chart review and post-visit care was 45 minutes.   A separate 15 minutes was spent on risk counseling (see above).    I, Trixie Dredge, am acting as transcriptionist for Coralie Common, MD.  I have reviewed the above documentation for accuracy and completeness, and I agree with the above. - Jinny Blossom, MD

## 2020-05-23 ENCOUNTER — Ambulatory Visit (INDEPENDENT_AMBULATORY_CARE_PROVIDER_SITE_OTHER): Payer: No Typology Code available for payment source | Admitting: Family Medicine

## 2020-05-26 ENCOUNTER — Ambulatory Visit (INDEPENDENT_AMBULATORY_CARE_PROVIDER_SITE_OTHER): Payer: No Typology Code available for payment source | Admitting: Family Medicine

## 2020-05-26 ENCOUNTER — Encounter (INDEPENDENT_AMBULATORY_CARE_PROVIDER_SITE_OTHER): Payer: Self-pay | Admitting: Family Medicine

## 2020-05-26 ENCOUNTER — Other Ambulatory Visit: Payer: Self-pay

## 2020-05-26 VITALS — BP 128/73 | HR 63 | Temp 98.0°F | Ht 64.0 in | Wt 233.0 lb

## 2020-05-26 DIAGNOSIS — Z9189 Other specified personal risk factors, not elsewhere classified: Secondary | ICD-10-CM

## 2020-05-26 DIAGNOSIS — E559 Vitamin D deficiency, unspecified: Secondary | ICD-10-CM | POA: Diagnosis not present

## 2020-05-26 DIAGNOSIS — Z6841 Body Mass Index (BMI) 40.0 and over, adult: Secondary | ICD-10-CM

## 2020-05-26 DIAGNOSIS — E8881 Metabolic syndrome: Secondary | ICD-10-CM | POA: Diagnosis not present

## 2020-05-26 DIAGNOSIS — E88819 Insulin resistance, unspecified: Secondary | ICD-10-CM

## 2020-05-26 DIAGNOSIS — E66813 Obesity, class 3: Secondary | ICD-10-CM

## 2020-05-26 MED ORDER — VITAMIN D (ERGOCALCIFEROL) 1.25 MG (50000 UNIT) PO CAPS: 50000.0000 [IU] | ORAL_CAPSULE | ORAL | 0 refills | Status: DC

## 2020-05-26 MED FILL — VIT D2 1.25 MG (50,000 UNIT: 1.25 MG | 28 days supply | Qty: 4 | Fill #0

## 2020-05-30 NOTE — Progress Notes (Signed)
Chief Complaint:   OBESITY Nancy Gilmore is here to discuss her progress with her obesity treatment plan along with follow-up of her obesity related diagnoses. Nancy Gilmore is on the Category 2 Plan +100 calories and 6 oz of protein and states she is following her eating plan approximately 50% of the time. Nancy Gilmore states she is doing Body Pump and yoga 60 minutes 2 times per week.  Today's visit was #: 3 Starting weight: 236 lbs Starting date: 04/20/2020 Today's weight: 233 lbs Today's date: 05/26/2020 Total lbs lost to date: 3 lbs Total lbs lost since last in-office visit: 1lb  Interim History:Nancy Gilmore has had a lot of familial and professional things going on. She does not know if anything could be easier for her. She has a daughter that is expecting and her mother requires more care.   Subjective:   1. Vitamin D deficiency Nancy Gilmore's Vitamin D level was 37 on 04/20/2020. She is currently taking prescription vitamin D 50,000 IU each week. She denies nausea, vomiting or muscle weakness. She reports fatigue.    2. Insulin resistance Nancy Gilmore has a diagnosis of insulin resistance based on her elevated fasting insulin level >5. She continues to work on diet and exercise to decrease her risk of diabetes. She is not currently on medications.   Lab Results  Component Value Date   INSULIN 18.1 04/20/2020   Lab Results  Component Value Date   HGBA1C 5.4 04/20/2020    3. At risk for osteoporosis Nancy Gilmore is at higher risk of osteopenia and osteoporosis due to Vitamin D deficiency.   Assessment/Plan:   1. Vitamin D deficiency Low Vitamin D level contributes to fatigue and are associated with obesity, breast, and colon cancer. She agrees to continue to take prescription Vitamin D @50 ,000 IU every week and will follow-up for routine testing of Vitamin D, at least 2-3 times per year to avoid over-replacement. - Vitamin D, Ergocalciferol, (DRISDOL) 1.25 MG (50000 UNIT) CAPS capsule; Take 1 capsule  (50,000 Units total) by mouth every 7 (seven) days.  Dispense: 4 capsule; Refill: 0  2. Insulin resistance Nancy Gilmore will continue to work on weight loss, exercise, and decreasing simple carbohydrates to help decrease the risk of diabetes. Nancy Gilmore agreed to follow-up with [place] as directed to closely monitor her progress. We will repeat labs at the end of September.   3. At risk for osteoporosis Nancy Gilmore was given approximately 15 minutes of osteoporosis prevention counseling today. Nancy Gilmore is at risk for osteopenia and osteoporosis due to her Vitamin D deficiency. She was encouraged to take her Vitamin D and follow her higher calcium diet and increase strengthening exercise to help strengthen her bones and decrease her risk of osteopenia and osteoporosis.  Repetitive spaced learning was employed today to elicit superior memory formation and behavioral change.  4. Class 3 severe obesity with serious comorbidity and body mass index (BMI) of 40.0 to 44.9 in adult, unspecified obesity type Nancy Gilmore) Nancy Gilmore is currently in the action stage of change. As such, her goal is to continue with weight loss efforts. She has agreed to the Category 2 Plan + 100 calories and 6 oz of protein.   Exercise goals: No exercise has been prescribed at this time.  Behavioral modification strategies: increasing lean protein intake, meal planning and cooking strategies, keeping healthy foods in the home and planning for success.  Nancy Gilmore has agreed to follow-up with our clinic in 2-3 weeks. She was informed of the importance of frequent follow-up visits to maximize her  success with intensive lifestyle modifications for her multiple health conditions.    Objective:   Blood pressure 128/73, pulse 63, temperature 98 F (36.7 C), temperature source Oral, height 5\' 4"  (1.626 m), weight 233 lb (105.7 kg), SpO2 99 %. Body mass index is 39.99 kg/m.  General: Cooperative, alert, well developed, in no acute distress. HEENT:  Conjunctivae and lids unremarkable. Cardiovascular: Regular rhythm.  Lungs: Normal work of breathing. Neurologic: No focal deficits.   Lab Results  Component Value Date   CREATININE 0.71 04/20/2020   BUN 12 04/20/2020   NA 141 04/20/2020   K 4.5 04/20/2020   CL 102 04/20/2020   CO2 23 04/20/2020   Lab Results  Component Value Date   ALT 16 04/20/2020   AST 21 04/20/2020   ALKPHOS 68 04/20/2020   BILITOT 0.3 04/20/2020   Lab Results  Component Value Date   HGBA1C 5.4 04/20/2020   HGBA1C 5.4 01/12/2020   HGBA1C  12/15/2009    5.5 (NOTE) The Nancy Gilmore recommends the following therapeutic goal for glycemic control related to Hgb A1c measurement: Goal of therapy: <6.5 Hgb A1c  Reference: American Diabetes Association: Clinical Practice Recommendations 2010, Diabetes Care, 2010, 33: (Suppl  1).   Lab Results  Component Value Date   INSULIN 18.1 04/20/2020   Lab Results  Component Value Date   TSH 1.350 04/20/2020   Lab Results  Component Value Date   CHOL 178 04/20/2020   HDL 87 04/20/2020   LDLCALC 80 04/20/2020   TRIG 57 04/20/2020   CHOLHDL 3.0 01/12/2020   Lab Results  Component Value Date   WBC 6.4 04/20/2020   HGB 12.6 04/20/2020   HCT 39.2 04/20/2020   MCV 92 04/20/2020   PLT 236 04/20/2020   No results found for: IRON, TIBC, FERRITIN  Obesity Behavioral Intervention Documentation for Insurance:   Approximately 15 minutes were spent on the discussion below.  ASK: We discussed the diagnosis of obesity with Nancy Gilmore today and Nancy Gilmore agreed to give [place] permission to discuss obesity behavioral modification therapy today.  ASSESS: Nancy Gilmore has the diagnosis of obesity and her BMI today is 39.97. Nancy Gilmore is in the action stage of change.   ADVISE: Nancy Gilmore was educated on the multiple health risks of obesity as well as the benefit of weight loss to improve her health. She was advised of the need for long term treatment and the importance of lifestyle modifications  to improve her current health and to decrease her risk of future health problems.  AGREE: Multiple dietary modification options and treatment options were discussed and Nancy Gilmore agreed to follow the recommendations documented in the above note.  ARRANGE: Nancy Gilmore was educated on the importance of frequent visits to treat obesity as outlined per CMS and USPSTF guidelines and agreed to schedule her next follow up appointment today.  Attestation Statements:   Reviewed by clinician on day of visit: allergies, medications, problem list, medical history, surgical history, family history, social history, and previous encounter notes.  I, Renee Ramus, am acting as transcriptionist for Nancy Qua, MD .  I have reviewed the above documentation for accuracy and completeness, and I agree with the above. - Nancy Blossom, MD

## 2020-05-31 ENCOUNTER — Encounter: Payer: No Typology Code available for payment source | Attending: Internal Medicine | Admitting: Nutrition

## 2020-05-31 ENCOUNTER — Other Ambulatory Visit: Payer: Self-pay

## 2020-05-31 ENCOUNTER — Encounter: Payer: Self-pay | Admitting: Nutrition

## 2020-05-31 VITALS — Ht 65.0 in | Wt 237.3 lb

## 2020-05-31 DIAGNOSIS — E669 Obesity, unspecified: Secondary | ICD-10-CM | POA: Insufficient documentation

## 2020-05-31 DIAGNOSIS — E559 Vitamin D deficiency, unspecified: Secondary | ICD-10-CM | POA: Insufficient documentation

## 2020-05-31 DIAGNOSIS — K219 Gastro-esophageal reflux disease without esophagitis: Secondary | ICD-10-CM | POA: Insufficient documentation

## 2020-05-31 NOTE — Progress Notes (Addendum)
[id number]  First visit Medical Nutrition Therapy:  Appt start time: 0800 end time:  0900.  Assessment:  Primary concerns today: Obesity BMI 39.9. Her son lives with her. She works for [employer] in [employer]. Works from 6 to [place], [employer], MD sees her as a patient. Just started to go to [facility] in the past few weeks. Working on behavioral changes.  Has a lot of stress with family and work. Admits to being an emotional stress eater.  Family history of obesity and has been overweight from middle school.  Had a hysterectomy in 2008 and quit smoking and put on 70-80 lbs since then. Has tried fasting but regained the weight after going back to old habits. Doesn't want to take weight loss meds or injections.Doristine Church to [employer] 1-2 times per week for yoga or zumba.   Wt Readings from Last 3 Encounters:  05/31/20 237 lb 4.8 oz (107.6 kg)  05/26/20 233 lb (105.7 kg)  05/04/20 234 lb (106.1 kg)   Ht Readings from Last 3 Encounters:  05/31/20 5\' 5"  (1.651 m)  05/26/20 5\' 4"  (1.626 m)  05/04/20 5\' 4"  (1.626 m)   Body mass index is 39.49 kg/m. @BMIFA @ Facility age limit for growth percentiles is 20 years. Facility age limit for growth percentiles is 20 years. CMP Latest Ref Rng & Units 04/20/2020 01/12/2020 12/19/2009  Glucose 65 - 99 mg/dL 88 80 101(H)  BUN 6 - 24 mg/dL 12 13 7   Creatinine 0.57 - 1.00 mg/dL 0.71 0.79 0.70  Sodium 134 - 144 mmol/L 141 140 140  Potassium 3.5 - 5.2 mmol/L 4.5 3.9 3.3(L)  Chloride 96 - 106 mmol/L 102 102 104  CO2 20 - 29 mmol/L 23 28 27   Calcium 8.7 - 10.2 mg/dL 9.3 9.6 8.0(L)  Total Protein 6.0 - 8.5 g/dL 7.2 7.0 -  Total Bilirubin 0.0 - 1.2 mg/dL 0.3 0.4 -  Alkaline Phos 48 - 121 IU/L 68 - -  AST 0 - 40 IU/L 21 17 -  ALT 0 - 32 IU/L 16 15 -   Lipid Panel     Component Value Date/Time   CHOL 178 04/20/2020 1102   TRIG 57 04/20/2020 1102   HDL 87 04/20/2020 1102   CHOLHDL 3.0 01/12/2020 1033   LDLCALC 80 04/20/2020 1102   LDLCALC 95  01/12/2020 1033   LABVLDL 11 04/20/2020 1102   Lab Results  Component Value Date   HGBA1C 5.4 04/20/2020     Preferred Learning Style:   No preference indication Learning Readiness:   Contemplating  MEDICATIONS:    DIETARY INTAKE:    Eats 2-3 meals per day . Meals aren't well planned out. Eats on the go often.  Usual physical activity: Y twice a week.   Estimated energy needs: 1200 calories 135 g carbohydrates 90 g protein 33 g fat  Progress Towards Goal(s):  In progress.   Nutritional Diagnosis:  NI-1.5 Excessive energy intake As related to Overeating.  As evidenced by BMI 39/9.    Intervention:  Nutrition and weight loss education provided on My Plate, CHO counting, meal planning, portion sizes, timing of meals, avoiding snacks between meals glycemia, monitoring blood sugars, taking medications as prescribed, benefits of exercising 60 minutes per day and prevention of  DM. Emotional eating.  Goals Follow MY Plate Eat meals on time Cut out processed snacks, chips and junk food Drink only water- 1 gallon of water -8 bottles of water per day Keep  food journal on [email or web address] Lose 1 lb per week   Teaching Method Utilized:  Visual Auditory Hands on  Handouts given during visit include:  The Plate Method   Meal Plan Card  Weight loss tips   Barriers to learning/adherence to lifestyle change: none  Demonstrated degree of understanding via:  Teach Back   Monitoring/Evaluation:  Dietary intake, exercise, , and body weight in 1 week(s).

## 2020-05-31 NOTE — Patient Instructions (Addendum)
Goals Follow MY Plate Eat meals on time Cut out processed snacks, chips and junk food Drink only water- 1 gallon of water -8 bottles of water per day Keep food journal on [email or web address] Lose 1 lb per week

## 2020-06-09 ENCOUNTER — Encounter: Payer: No Typology Code available for payment source | Admitting: Nutrition

## 2020-06-14 ENCOUNTER — Ambulatory Visit (INDEPENDENT_AMBULATORY_CARE_PROVIDER_SITE_OTHER): Payer: No Typology Code available for payment source | Admitting: Family Medicine

## 2020-06-14 ENCOUNTER — Encounter: Payer: Self-pay | Admitting: Nutrition

## 2020-06-14 ENCOUNTER — Other Ambulatory Visit: Payer: Self-pay

## 2020-06-14 ENCOUNTER — Encounter: Payer: No Typology Code available for payment source | Attending: Internal Medicine | Admitting: Nutrition

## 2020-06-14 VITALS — Ht 65.0 in | Wt 235.0 lb

## 2020-06-14 DIAGNOSIS — E669 Obesity, unspecified: Secondary | ICD-10-CM | POA: Insufficient documentation

## 2020-06-14 NOTE — Progress Notes (Signed)
[facility] [id number] Second Visit  Medical Nutrition Therapy:  Appt start time: 0800 end time:  0830.Marland Kitchen  Assessment:  Primary concerns today: Obesity BMI 39.9. Her son lives with her. She works for [employer] in [employer]. Works from 6 to [place], [employer], MD sees her as a patient.    Has cut back on eating out. Drinking more water. Trying to cut out processed foods. Trying to plan her meals and take her food to work. Still cares for her mom who has a stroke. Working on cutting out tempting foods and buying groceries on line to resist temptation.   Wt Readings from Last 3 Encounters:  05/31/20 237 lb 4.8 oz (107.6 kg)  05/26/20 233 lb (105.7 kg)  05/04/20 234 lb (106.1 kg)   Ht Readings from Last 3 Encounters:  05/31/20 5\' 5"  (1.651 m)  05/26/20 5\' 4"  (1.626 m)  05/04/20 5\' 4"  (1.626 m)   There is no height or weight on file to calculate BMI. @BMIFA @ Facility age limit for growth percentiles is 20 years. Facility age limit for growth percentiles is 20 years. CMP Latest Ref Rng & Units 04/20/2020 01/12/2020 12/19/2009  Glucose 65 - 99 mg/dL 88 80 101(H)  BUN 6 - 24 mg/dL 12 13 7   Creatinine 0.57 - 1.00 mg/dL 0.71 0.79 0.70  Sodium 134 - 144 mmol/L 141 140 140  Potassium 3.5 - 5.2 mmol/L 4.5 3.9 3.3(L)  Chloride 96 - 106 mmol/L 102 102 104  CO2 20 - 29 mmol/L 23 28 27   Calcium 8.7 - 10.2 mg/dL 9.3 9.6 8.0(L)  Total Protein 6.0 - 8.5 g/dL 7.2 7.0 -  Total Bilirubin 0.0 - 1.2 mg/dL 0.3 0.4 -  Alkaline Phos 48 - 121 IU/L 68 - -  AST 0 - 40 IU/L 21 17 -  ALT 0 - 32 IU/L 16 15 -   Lipid Panel     Component Value Date/Time   CHOL 178 04/20/2020 1102   TRIG 57 04/20/2020 1102   HDL 87 04/20/2020 1102   CHOLHDL 3.0 01/12/2020 1033   LDLCALC 80 04/20/2020 1102   LDLCALC 95 01/12/2020 1033   LABVLDL 11 04/20/2020 1102   Lab Results  Component Value Date   HGBA1C 5.4 04/20/2020     Preferred Learning Style:   No preference indication Learning Readiness:   Contemplating  MEDICATIONS:     DIETARY INTAKE:    B) yogurt  Banana L) tomato and cheese sandwich, water D) 2 eggs and 2 slices of toast.   Usual physical activity: Y twice a week.   Estimated energy needs: 1200 calories 135 g carbohydrates 90 g protein 33 g fat  Progress Towards Goal(s):  In progress.   Nutritional Diagnosis:  NI-1.5 Excessive energy intake As related to Overeating.  As evidenced by BMI 39/9.    Intervention:  Nutrition and weight loss education provided on My Plate, CHO counting, meal planning, portion sizes, timing of meals, avoiding snacks between meals glycemia, monitoring blood sugars, taking medications as prescribed, benefits of exercising 60 minutes per day and prevention of  DM. Emotional eating.  Goals  Increase water intake to 4-5 bottles of water per day. Increase more lower carb vegetables with meals.. Get sectional plates. Continue to avoid snacks between meals and fast foods. Lose 1 lb per week   Teaching Method Utilized:  Visual Auditory Hands on  Handouts given during visit include:  The Plate Method   Meal Plan Card  Weight loss tips   Barriers  to learning/adherence to lifestyle change: none  Demonstrated degree of understanding via:  Teach Back   Monitoring/Evaluation:  Dietary intake, exercise, , and body weight in  2 week(s).

## 2020-06-14 NOTE — Patient Instructions (Addendum)
Goals  Increase water intake to 4-5 bottles of water per day. Increase more lower carb vegetables with meals.. Get sectional plates. Continue to avoid snacks between meals and fast foods. Lose 1 lb per week

## 2020-06-23 ENCOUNTER — Ambulatory Visit (INDEPENDENT_AMBULATORY_CARE_PROVIDER_SITE_OTHER): Payer: No Typology Code available for payment source | Admitting: Family Medicine

## 2020-06-23 ENCOUNTER — Other Ambulatory Visit: Payer: Self-pay

## 2020-06-23 ENCOUNTER — Encounter (INDEPENDENT_AMBULATORY_CARE_PROVIDER_SITE_OTHER): Payer: Self-pay | Admitting: Family Medicine

## 2020-06-23 VITALS — BP 120/73 | HR 89 | Temp 98.0°F | Ht 65.0 in | Wt 232.0 lb

## 2020-06-23 DIAGNOSIS — Z9189 Other specified personal risk factors, not elsewhere classified: Secondary | ICD-10-CM

## 2020-06-23 DIAGNOSIS — E88819 Insulin resistance, unspecified: Secondary | ICD-10-CM

## 2020-06-23 DIAGNOSIS — E8881 Metabolic syndrome: Secondary | ICD-10-CM | POA: Diagnosis not present

## 2020-06-23 DIAGNOSIS — E559 Vitamin D deficiency, unspecified: Secondary | ICD-10-CM

## 2020-06-23 DIAGNOSIS — Z6839 Body mass index (BMI) 39.0-39.9, adult: Secondary | ICD-10-CM

## 2020-06-23 MED ORDER — VITAMIN D (ERGOCALCIFEROL) 1.25 MG (50000 UNIT) PO CAPS: 50000.0000 [IU] | ORAL_CAPSULE | ORAL | 0 refills | Status: DC

## 2020-06-23 MED FILL — VIT D2 1.25 MG (50,000 UNIT: 1.25 MG | 28 days supply | Qty: 4 | Fill #0

## 2020-06-27 MED FILL — ESTRADIOL 0.1 MG/24HR PTTW: 0.1 | 84 days supply | Qty: 24 | Fill #2

## 2020-06-27 NOTE — Progress Notes (Signed)
Chief Complaint:   OBESITY Nancy Gilmore is here to discuss her progress with her obesity treatment plan along with follow-up of her obesity related diagnoses. Nancy Gilmore is on the Category 2 Plan + 100 calories and states she is following her eating plan approximately 50% of the time. Nancy Gilmore states she is doing 0 minutes 0 times per week.  Today's visit was #: 4 Starting weight: 236 lbs Starting date: 04/20/2020 Today's weight: 232 lbs Today's date: 06/23/2020 Total lbs lost to date: 4 Total lbs lost since last in-office visit: 1  Interim History: Nancy Gilmore has been very busy the last few weeks. She has some familial responsibilities (mother had a stroke). So she is not able to commit completely to the meal plan. She has seen a dietician in the last few weeks, and she feels her mindfulness has improved. She is doing yogurt and/or eggs in the morning.  Subjective:   1. Vitamin D deficiency Nancy Gilmore denies nausea, vomiting, or muscle weakness, but she notes fatigue. She is on prescription Vit D. Last Vit D level was 37.0.  2. Insulin resistance Nancy Gilmore's last A1c was 5.4 and insulin 18.1. She is on any medications.  3. At risk for osteoporosis Nancy Gilmore is at higher risk of osteopenia and osteoporosis due to Vitamin D deficiency.   Assessment/Plan:   1. Vitamin D deficiency Low Vitamin D level contributes to fatigue and are associated with obesity, breast, and colon cancer. We will refill prescription Vitamin D for 1 month. Jaeden will follow-up for routine testing of Vitamin D, at least 2-3 times per year to avoid over-replacement.  - Vitamin D, Ergocalciferol, (DRISDOL) 1.25 MG (50000 UNIT) CAPS capsule; Take 1 capsule (50,000 Units total) by mouth every 7 (seven) days.  Dispense: 4 capsule; Refill: 0  2. Insulin resistance Nancy Gilmore will continue her Category 2 meal plan, and will continue to work on weight loss, exercise, and decreasing simple carbohydrates to help decrease the risk of  diabetes. We will repeat labs in 1.5 months. Nancy Gilmore agreed to follow-up with [place] as directed to closely monitor her progress.  3. At risk for osteoporosis Nancy Gilmore was given approximately 15 minutes of osteoporosis prevention counseling today. Nancy Gilmore is at risk for osteopenia and osteoporosis due to her Vitamin D deficiency. She was encouraged to take her Vitamin D and follow her higher calcium diet and increase strengthening exercise to help strengthen her bones and decrease her risk of osteopenia and osteoporosis.  Repetitive spaced learning was employed today to elicit superior memory formation and behavioral change.  4. Class 2 severe obesity with serious comorbidity and body mass index (BMI) of 39.0 to 39.9 in adult, unspecified obesity type [facility]) Nancy Gilmore is currently in the action stage of change. As such, her goal is to continue with weight loss efforts. She has agreed to the Category 2 Plan + 100 calories. She is to have dinner on the meal plan 3 times per week.  Exercise goals: All adults should avoid inactivity. Some physical activity is better than none, and adults who participate in any amount of physical activity gain some health benefits.  Behavioral modification strategies: increasing lean protein intake, meal planning and cooking strategies, keeping healthy foods in the home and planning for success.  Nancy Gilmore has agreed to follow-up with our clinic in 2 weeks. She was informed of the importance of frequent follow-up visits to maximize her success with intensive lifestyle modifications for her multiple health conditions.   Objective:   Blood pressure 120/73, pulse 89,  temperature 98 F (36.7 C), temperature source Oral, height 5\' 5"  (1.651 m), weight 232 lb (105.2 kg), SpO2 100 %. Body mass index is 38.61 kg/m.  General: Cooperative, alert, well developed, in no acute distress. HEENT: Conjunctivae and lids unremarkable. Cardiovascular: Regular rhythm.  Lungs: Normal work of  breathing. Neurologic: No focal deficits.   Lab Results  Component Value Date   CREATININE 0.71 04/20/2020   BUN 12 04/20/2020   NA 141 04/20/2020   K 4.5 04/20/2020   CL 102 04/20/2020   CO2 23 04/20/2020   Lab Results  Component Value Date   ALT 16 04/20/2020   AST 21 04/20/2020   ALKPHOS 68 04/20/2020   BILITOT 0.3 04/20/2020   Lab Results  Component Value Date   HGBA1C 5.4 04/20/2020   HGBA1C 5.4 01/12/2020   HGBA1C  12/15/2009    5.5 (NOTE) The ADA recommends the following therapeutic goal for glycemic control related to Hgb A1c measurement: Goal of therapy: <6.5 Hgb A1c  Reference: American Diabetes Association: Clinical Practice Recommendations 2010, Diabetes Care, 2010, 33: (Suppl  1).   Lab Results  Component Value Date   INSULIN 18.1 04/20/2020   Lab Results  Component Value Date   TSH 1.350 04/20/2020   Lab Results  Component Value Date   CHOL 178 04/20/2020   HDL 87 04/20/2020   LDLCALC 80 04/20/2020   TRIG 57 04/20/2020   CHOLHDL 3.0 01/12/2020   Lab Results  Component Value Date   WBC 6.4 04/20/2020   HGB 12.6 04/20/2020   HCT 39.2 04/20/2020   MCV 92 04/20/2020   PLT 236 04/20/2020   No results found for: IRON, TIBC, FERRITIN  Attestation Statements:   Reviewed by clinician on day of visit: allergies, medications, problem list, medical history, surgical history, family history, social history, and previous encounter notes.   I, Trixie Dredge, am acting as transcriptionist for Coralie Common, MD.  I have reviewed the above documentation for accuracy and completeness, and I agree with the above. - Jinny Blossom, MD

## 2020-06-29 ENCOUNTER — Other Ambulatory Visit: Payer: Self-pay

## 2020-06-29 ENCOUNTER — Encounter: Payer: No Typology Code available for payment source | Attending: Internal Medicine | Admitting: Nutrition

## 2020-06-29 ENCOUNTER — Encounter: Payer: Self-pay | Admitting: Nutrition

## 2020-06-29 VITALS — Ht 65.0 in | Wt 238.0 lb

## 2020-06-29 DIAGNOSIS — E669 Obesity, unspecified: Secondary | ICD-10-CM | POA: Insufficient documentation

## 2020-06-29 DIAGNOSIS — E559 Vitamin D deficiency, unspecified: Secondary | ICD-10-CM | POA: Insufficient documentation

## 2020-06-29 DIAGNOSIS — K219 Gastro-esophageal reflux disease without esophagitis: Secondary | ICD-10-CM | POA: Insufficient documentation

## 2020-06-29 NOTE — Progress Notes (Signed)
[facility] ID [id number] Third  Visit  Medical Nutrition Therapy:  Appt start time:  1615  end time: 1630 Assessment:  Primary concerns today: Obesity BMI 39.9.  She didn't meet any of the goals that we had set previously.. Has gained 6 lbs since last visit.  She finds herself stress eating. Has a lot of stress going on and has been eating out a lot.  Willing to maybe see the EAP for helping with reducing stress and work on emotional eating when stressed.   Wt Readings from Last 3 Encounters:  06/29/20 238 lb (108 kg)  06/23/20 232 lb (105.2 kg)  06/14/20 235 lb (106.6 kg)   Ht Readings from Last 3 Encounters:  06/29/20 5\' 5"  (1.651 m)  06/23/20 5\' 5"  (1.651 m)  06/14/20 5\' 5"  (1.651 m)   Body mass index is 39.61 kg/m. @BMIFA @ Facility age limit for growth percentiles is 20 years. Facility age limit for growth percentiles is 20 years. CMP Latest Ref Rng & Units 04/20/2020 01/12/2020 12/19/2009  Glucose 65 - 99 mg/dL 88 80 101(H)  BUN 6 - 24 mg/dL 12 13 7   Creatinine 0.57 - 1.00 mg/dL 0.71 0.79 0.70  Sodium 134 - 144 mmol/L 141 140 140  Potassium 3.5 - 5.2 mmol/L 4.5 3.9 3.3(L)  Chloride 96 - 106 mmol/L 102 102 104  CO2 20 - 29 mmol/L 23 28 27   Calcium 8.7 - 10.2 mg/dL 9.3 9.6 8.0(L)  Total Protein 6.0 - 8.5 g/dL 7.2 7.0 -  Total Bilirubin 0.0 - 1.2 mg/dL 0.3 0.4 -  Alkaline Phos 48 - 121 IU/L 68 - -  AST 0 - 40 IU/L 21 17 -  ALT 0 - 32 IU/L 16 15 -   Lipid Panel     Component Value Date/Time   CHOL 178 04/20/2020 1102   TRIG 57 04/20/2020 1102   HDL 87 04/20/2020 1102   CHOLHDL 3.0 01/12/2020 1033   LDLCALC 80 04/20/2020 1102   LDLCALC 95 01/12/2020 1033   LABVLDL 11 04/20/2020 1102   Lab Results  Component Value Date   HGBA1C 5.4 04/20/2020   Preferred Learning Style:   No preference indication Learning Readiness:   Contemplating  MEDICATIONS:    DIETARY INTAKE:  Has been eating out a lot with friends and family.   Usual physical activity: [facility]  Estimated energy needs: 1200 calories 135 g carbohydrates 90 g protein 33 g fat  Progress Towards Goal(s):  In progress.   Nutritional Diagnosis:  NI-1.5 Excessive energy intake As related to Overeating.  As evidenced by BMI 39/9.    Intervention:  Nutrition and weight loss education provided on My Plate, CHO counting, meal planning, portion sizes, timing of meals, avoiding snacks between meals glycemia, monitoring blood sugars, taking medications as prescribed, benefits of exercising 60 minutes per day and prevention of  DM. Emotional eating.   Goals  Cut down on eating out- 1-2 times per week. Increase lower carb vegetables Cut out snacks Drink only water Think about making appt with the EAP counselor.  Increase exercise to 30 minutes 3 times per week. Lose 1 lb per week.   Teaching Method Utilized:  Visual Auditory Hands on  Handouts given during visit include:  The Plate Method   Meal Plan Card  Weight loss tips   Barriers to learning/adherence to lifestyle change: none  Demonstrated degree of understanding via:  Teach Back   Monitoring/Evaluation:  Dietary intake, exercise, , and body weight in  PRN(s).

## 2020-06-29 NOTE — Patient Instructions (Addendum)
Goals  Cut down on eating out- 1-2 times per week. Increase lower carb vegetables Cut out snacks Drink only water Think about making appt with the EAP counselor.  Increase exercise to 30 minutes 3 times per week. Lose 1 lb per week.

## 2020-07-13 ENCOUNTER — Ambulatory Visit (INDEPENDENT_AMBULATORY_CARE_PROVIDER_SITE_OTHER): Payer: No Typology Code available for payment source | Admitting: Family Medicine

## 2020-07-13 ENCOUNTER — Encounter (INDEPENDENT_AMBULATORY_CARE_PROVIDER_SITE_OTHER): Payer: Self-pay | Admitting: Family Medicine

## 2020-07-13 ENCOUNTER — Other Ambulatory Visit: Payer: Self-pay

## 2020-07-13 VITALS — BP 114/71 | HR 63 | Temp 97.5°F | Ht 65.0 in | Wt 232.0 lb

## 2020-07-13 DIAGNOSIS — Z6838 Body mass index (BMI) 38.0-38.9, adult: Secondary | ICD-10-CM

## 2020-07-13 DIAGNOSIS — E559 Vitamin D deficiency, unspecified: Secondary | ICD-10-CM

## 2020-07-13 DIAGNOSIS — F419 Anxiety disorder, unspecified: Secondary | ICD-10-CM

## 2020-07-13 DIAGNOSIS — Z9189 Other specified personal risk factors, not elsewhere classified: Secondary | ICD-10-CM | POA: Diagnosis not present

## 2020-07-13 DIAGNOSIS — F32A Depression, unspecified: Secondary | ICD-10-CM

## 2020-07-13 DIAGNOSIS — F329 Major depressive disorder, single episode, unspecified: Secondary | ICD-10-CM

## 2020-07-13 MED ORDER — BUPROPION HCL ER (SR) 100 MG PO TB12: 100.0000 mg | ORAL_TABLET | Freq: Every day | ORAL | 0 refills | Status: DC

## 2020-07-13 MED ORDER — VITAMIN D (ERGOCALCIFEROL) 1.25 MG (50000 UNIT) PO CAPS: 50000.0000 [IU] | ORAL_CAPSULE | ORAL | 0 refills | Status: DC

## 2020-07-13 MED FILL — buPROPion HCL ER (SR) 100 M: 100 | 30 days supply | Qty: 30 | Fill #0

## 2020-07-13 NOTE — Progress Notes (Signed)
Chief Complaint:   OBESITY Nancy Gilmore is here to discuss her progress with her obesity treatment plan along with follow-up of her obesity related diagnoses. Nancy Gilmore is on the Category 2 Plan + 100 calories and states she is following her eating plan approximately 30% of the time. Nancy Gilmore states she is doing yoga for 60 minutes 1-2 times per week.  Today's visit was #: 5 Starting weight: 236 lbs Starting date: 04/20/2020 Today's weight: 232 lbs Today's date: 07/13/2020 Total lbs lost to date: 4 Total lbs lost since last in-office visit: 0  Interim History: Nancy Gilmore voices she is trying to find care for her mother who has had a stroke previously, and is trying to manage her affairs. She has a lot of familial stress and demands on her time. She is not relly able to focus closely on her own health.  Subjective:   1. Vitamin D deficiency Roshell denies nausea, vomiting, or muscle weakness, but note fatigue. She is on prescription Vit D. Last Vit D level was 37.  2. Anxiety and depression Nancy Gilmore has not been on medications previously, and she voices she has done a significant amount of stress eating.  3. At risk for osteoporosis Nancy Gilmore is at higher risk of osteopenia and osteoporosis due to Vitamin D deficiency.   Assessment/Plan:   1. Vitamin D deficiency Low Vitamin D level contributes to fatigue and are associated with obesity, breast, and colon cancer. We will refill prescription Vitamin D for 1 month. Nancy Gilmore will follow-up for routine testing of Vitamin D, at least 2-3 times per year to avoid over-replacement.  - Vitamin D, Ergocalciferol, (DRISDOL) 1.25 MG (50000 UNIT) CAPS capsule; Take 1 capsule (50,000 Units total) by mouth every 7 (seven) days.  Dispense: 4 capsule; Refill: 0  2. Anxiety and depression Behavior modification techniques were discussed today to help Nancy Gilmore deal with her anxiety and depression. Nancy Gilmore agreed to start Wellbutrin SR 100 mg PO daily with no  refills. Orders and follow up as documented in patient record.   - buPROPion (WELLBUTRIN SR) 100 MG 12 hr tablet; Take 1 tablet (100 mg total) by mouth daily.  Dispense: 30 tablet; Refill: 0  3. At risk for osteoporosis Nancy Gilmore was given approximately 12 minutes of osteoporosis prevention counseling today. Nancy Gilmore is at risk for osteopenia and osteoporosis due to her Vitamin D deficiency. She was encouraged to take her Vitamin D and follow her higher calcium diet and increase strengthening exercise to help strengthen her bones and decrease her risk of osteopenia and osteoporosis.  Repetitive spaced learning was employed today to elicit superior memory formation and behavioral change.  4. Class 2 severe obesity with serious comorbidity and body mass index (BMI) of 38.0 to 38.9 in adult, unspecified obesity type [facility]) Nancy Gilmore is currently in the action stage of change. As such, her goal is to continue with weight loss efforts. She has agreed to practicing portion control and making smarter food choices, such as increasing vegetables and decreasing simple carbohydrates.   Exercise goals: All adults should avoid inactivity. Some physical activity is better than none, and adults who participate in any amount of physical activity gain some health benefits.  Behavioral modification strategies: increasing lean protein intake, increasing vegetables, meal planning and cooking strategies and keeping healthy foods in the home.  Nancy Gilmore has agreed to follow-up with our clinic in 2 weeks. She was informed of the importance of frequent follow-up visits to maximize her success with intensive lifestyle modifications for her multiple  health conditions.   Objective:   Blood pressure 114/71, pulse 63, temperature (!) 97.5 F (36.4 C), temperature source Oral, height 5\' 5"  (1.651 m), weight 232 lb (105.2 kg), SpO2 97 %. Body mass index is 38.61 kg/m.  General: Cooperative, alert, well developed, in no acute  distress. HEENT: Conjunctivae and lids unremarkable. Cardiovascular: Regular rhythm.  Lungs: Normal work of breathing. Neurologic: No focal deficits.   Lab Results  Component Value Date   CREATININE 0.71 04/20/2020   BUN 12 04/20/2020   NA 141 04/20/2020   K 4.5 04/20/2020   CL 102 04/20/2020   CO2 23 04/20/2020   Lab Results  Component Value Date   ALT 16 04/20/2020   AST 21 04/20/2020   ALKPHOS 68 04/20/2020   BILITOT 0.3 04/20/2020   Lab Results  Component Value Date   HGBA1C 5.4 04/20/2020   HGBA1C 5.4 01/12/2020   HGBA1C  12/15/2009    5.5 (NOTE) The ADA recommends the following therapeutic goal for glycemic control related to Hgb A1c measurement: Goal of therapy: <6.5 Hgb A1c  Reference: American Diabetes Association: Clinical Practice Recommendations 2010, Diabetes Care, 2010, 33: (Suppl  1).   Lab Results  Component Value Date   INSULIN 18.1 04/20/2020   Lab Results  Component Value Date   TSH 1.350 04/20/2020   Lab Results  Component Value Date   CHOL 178 04/20/2020   HDL 87 04/20/2020   LDLCALC 80 04/20/2020   TRIG 57 04/20/2020   CHOLHDL 3.0 01/12/2020   Lab Results  Component Value Date   WBC 6.4 04/20/2020   HGB 12.6 04/20/2020   HCT 39.2 04/20/2020   MCV 92 04/20/2020   PLT 236 04/20/2020   No results found for: IRON, TIBC, FERRITIN  Attestation Statements:   Reviewed by clinician on day of visit: allergies, medications, problem list, medical history, surgical history, family history, social history, and previous encounter notes.   I, Trixie Dredge, am acting as transcriptionist for Coralie Common, MD.  I have reviewed the above documentation for accuracy and completeness, and I agree with the above. - Jinny Blossom, MD

## 2020-07-27 ENCOUNTER — Ambulatory Visit (INDEPENDENT_AMBULATORY_CARE_PROVIDER_SITE_OTHER): Payer: No Typology Code available for payment source | Admitting: Family Medicine

## 2020-08-04 ENCOUNTER — Encounter (INDEPENDENT_AMBULATORY_CARE_PROVIDER_SITE_OTHER): Payer: Self-pay

## 2020-08-04 ENCOUNTER — Ambulatory Visit (INDEPENDENT_AMBULATORY_CARE_PROVIDER_SITE_OTHER): Payer: No Typology Code available for payment source | Admitting: Family Medicine

## 2020-08-09 ENCOUNTER — Encounter (INDEPENDENT_AMBULATORY_CARE_PROVIDER_SITE_OTHER): Payer: Self-pay | Admitting: Family Medicine

## 2020-08-09 ENCOUNTER — Ambulatory Visit (INDEPENDENT_AMBULATORY_CARE_PROVIDER_SITE_OTHER): Payer: No Typology Code available for payment source | Admitting: Family Medicine

## 2020-08-09 ENCOUNTER — Other Ambulatory Visit: Payer: Self-pay

## 2020-08-09 VITALS — BP 143/80 | HR 58 | Temp 97.6°F | Ht 65.0 in | Wt 232.0 lb

## 2020-08-09 DIAGNOSIS — Z6838 Body mass index (BMI) 38.0-38.9, adult: Secondary | ICD-10-CM

## 2020-08-09 DIAGNOSIS — Z9189 Other specified personal risk factors, not elsewhere classified: Secondary | ICD-10-CM | POA: Diagnosis not present

## 2020-08-09 DIAGNOSIS — F32A Depression, unspecified: Secondary | ICD-10-CM

## 2020-08-09 DIAGNOSIS — F419 Anxiety disorder, unspecified: Secondary | ICD-10-CM | POA: Diagnosis not present

## 2020-08-09 DIAGNOSIS — E559 Vitamin D deficiency, unspecified: Secondary | ICD-10-CM | POA: Diagnosis not present

## 2020-08-09 DIAGNOSIS — F329 Major depressive disorder, single episode, unspecified: Secondary | ICD-10-CM

## 2020-08-09 MED ORDER — VITAMIN D (ERGOCALCIFEROL) 1.25 MG (50000 UNIT) PO CAPS: 50000.0000 [IU] | ORAL_CAPSULE | ORAL | 0 refills | Status: DC

## 2020-08-09 MED ORDER — BUPROPION HCL ER (SR) 100 MG PO TB12: 100.0000 mg | ORAL_TABLET | Freq: Every day | ORAL | 0 refills | Status: DC

## 2020-08-09 MED FILL — VIT D2 1.25 MG (50,000 UNIT: 1.25 MG | 28 days supply | Qty: 4 | Fill #0

## 2020-08-09 MED FILL — buPROPion HCL ER (SR) 100 M: 100 | 30 days supply | Qty: 30 | Fill #0

## 2020-08-09 NOTE — Progress Notes (Signed)
Chief Complaint:   OBESITY Nancy Gilmore is here to discuss her progress with her obesity treatment plan along with follow-up of her obesity related diagnoses. Nancy Gilmore is on the Category 2 Plan + 100 calories and states she is following her eating plan approximately 30% of the time. Nancy Gilmore states she is doing 0 minutes 0 times per week.  Today's visit was #: 6 Starting weight: 236 lbs Starting date: 04/20/2020 Today's weight: 232 lbs Today's date: 08/09/2020 Total lbs lost to date: 4 Total lbs lost since last in-office visit: 0  Interim History: Nancy Gilmore's life has still been very busy and she still has a significant amount on her plate. She feels she has gotten better with snacking and control of her snacking. She still needs work on Engineer, site. She is not making terribly indulgent choices but not always eating on the plan or quantity on the plan. She does think the more she gets stuff taken care of it will be easier to follow the meal plan.  Subjective:   1. Vitamin D deficiency Nancy Gilmore denies nausea, vomiting, or muscle weakness, but she notes fatigue. Last Vit D level was 37.0.  2. Anxiety and depression Nancy Gilmore notes some improvement in symptoms with initiation of Wellbutrin. She denies suicidal or homicidal ideas.  3. At risk for deficient intake of food The patient is at a higher than average risk of deficient intake of food due to often skipping meals or not eating quantity on the plan due to stress of life.  Assessment/Plan:   1. Vitamin D deficiency Low Vitamin D level contributes to fatigue and are associated with obesity, breast, and colon cancer. We will refill prescription Vitamin D for 1 month. Nancy Gilmore will follow-up for routine testing of Vitamin D, at least 2-3 times per year to avoid over-replacement.  - Vitamin D, Ergocalciferol, (DRISDOL) 1.25 MG (50000 UNIT) CAPS capsule; Take 1 capsule (50,000 Units total) by mouth every 7 (seven) days.  Dispense: 4 capsule;  Refill: 0  2. Anxiety and depression Behavior modification techniques were discussed today to help Nancy Gilmore deal with her anxiety and depression. We will refill Wellbutrin SR for 1 month. Orders and follow up as documented in patient record.   - buPROPion (WELLBUTRIN SR) 100 MG 12 hr tablet; Take 1 tablet (100 mg total) by mouth daily.  Dispense: 30 tablet; Refill: 0  3. At risk for deficient intake of food Nancy Gilmore was given approximately 15 minutes of deficit intake of food prevention counseling today. Nancy Gilmore is at risk for eating too few calories based on current food recall. She was encouraged to focus on meeting caloric and protein goals according to her recommended meal plan.   4. Class 2 severe obesity with serious comorbidity and body mass index (BMI) of 38.0 to 38.9 in adult, unspecified obesity type Nancy Gilmore) Nancy Gilmore is currently in the action stage of change. As such, her goal is to continue with weight loss efforts. She has agreed to the Category 2 Plan + 100 calories.   Exercise goals: No exercise has been prescribed at this time.  Behavioral modification strategies: increasing lean protein intake, meal planning and cooking strategies, keeping healthy foods in the home and planning for success.  Nancy Gilmore has agreed to follow-up with our clinic in 2 weeks. She was informed of the importance of frequent follow-up visits to maximize her success with intensive lifestyle modifications for her multiple health conditions.   Objective:   Blood pressure (!) 143/80, pulse (!) 58, temperature 97.6  F (36.4 C), temperature source Oral, height 5\' 5"  (1.651 m), weight 232 lb (105.2 kg), SpO2 99 %. Body mass index is 38.61 kg/m.  General: Cooperative, alert, well developed, in no acute distress. HEENT: Conjunctivae and lids unremarkable. Cardiovascular: Regular rhythm.  Lungs: Normal work of breathing. Neurologic: No focal deficits.   Lab Results  Component Value Date   CREATININE 0.71  04/20/2020   BUN 12 04/20/2020   NA 141 04/20/2020   K 4.5 04/20/2020   CL 102 04/20/2020   CO2 23 04/20/2020   Lab Results  Component Value Date   ALT 16 04/20/2020   AST 21 04/20/2020   ALKPHOS 68 04/20/2020   BILITOT 0.3 04/20/2020   Lab Results  Component Value Date   HGBA1C 5.4 04/20/2020   HGBA1C 5.4 01/12/2020   HGBA1C  12/15/2009    5.5 (NOTE) The ADA recommends the following therapeutic goal for glycemic control related to Hgb A1c measurement: Goal of therapy: <6.5 Hgb A1c  Reference: American Diabetes Association: Clinical Practice Recommendations 2010, Diabetes Care, 2010, 33: (Suppl  1).   Lab Results  Component Value Date   INSULIN 18.1 04/20/2020   Lab Results  Component Value Date   TSH 1.350 04/20/2020   Lab Results  Component Value Date   CHOL 178 04/20/2020   HDL 87 04/20/2020   LDLCALC 80 04/20/2020   TRIG 57 04/20/2020   CHOLHDL 3.0 01/12/2020   Lab Results  Component Value Date   WBC 6.4 04/20/2020   HGB 12.6 04/20/2020   HCT 39.2 04/20/2020   MCV 92 04/20/2020   PLT 236 04/20/2020   No results found for: IRON, TIBC, FERRITIN  Attestation Statements:   Reviewed by clinician on day of visit: allergies, medications, problem list, medical history, surgical history, family history, social history, and previous encounter notes.   I, Trixie Dredge, am acting as transcriptionist for Coralie Common, MD.  I have reviewed the above documentation for accuracy and completeness, and I agree with the above. - Jinny Blossom, MD

## 2020-08-22 ENCOUNTER — Ambulatory Visit (INDEPENDENT_AMBULATORY_CARE_PROVIDER_SITE_OTHER): Payer: No Typology Code available for payment source | Admitting: Nurse Practitioner

## 2020-08-23 ENCOUNTER — Ambulatory Visit (INDEPENDENT_AMBULATORY_CARE_PROVIDER_SITE_OTHER): Payer: No Typology Code available for payment source | Admitting: Nurse Practitioner

## 2020-08-23 ENCOUNTER — Encounter (INDEPENDENT_AMBULATORY_CARE_PROVIDER_SITE_OTHER): Payer: Self-pay | Admitting: Nurse Practitioner

## 2020-08-23 ENCOUNTER — Other Ambulatory Visit: Payer: Self-pay

## 2020-08-23 VITALS — BP 128/78 | HR 61 | Temp 96.9°F | Ht 65.0 in | Wt 228.4 lb

## 2020-08-23 DIAGNOSIS — E559 Vitamin D deficiency, unspecified: Secondary | ICD-10-CM

## 2020-08-23 DIAGNOSIS — E669 Obesity, unspecified: Secondary | ICD-10-CM

## 2020-08-23 NOTE — Progress Notes (Signed)
Subjective:  Patient ID: Nancy Gilmore, female    DOB: May 20, 1966  Age: 54 y.o. MRN: [id number]  CC:  Chief Complaint  Patient presents with  . Follow-up    Doing okay  . Obesity  . Other    Vitamin D deficiency      HPI  This patient arrives today for the above.  Obesity: BMI over 38.  She continues to follow-up with the healthy weight loss clinic.  She plans on seeing them tomorrow.  She was started on Wellbutrin a few weeks ago due to experiencing quite a bit of stress in her life.  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  She denies any suicidal ideation today.  Vitamin D deficiency: She continues on her weekly D2.  Last serum check was collected approximately 3 months ago and it was 37.  Past Medical History:  Diagnosis Date  . Cellulitis of leg, right   . Fatty liver   . Fibroids    uterine  . GERD (gastroesophageal reflux disease) 08/11/2019  . Obesity (BMI 30-39.9) 08/11/2019  . Vitamin D deficiency disease 08/11/2019      Family History  Problem Relation Age of Onset  . Breast cancer Maternal Aunt   . Hyperlipidemia Mother   . Hypertension Mother   . Stroke Mother   . Depression Mother   . Obesity Mother   . Cancer Father   . Alcohol abuse Father   . Obesity Father     Social History   Social History Narrative   Single,lives with 2 kids.Security at [employer].   Social History   Tobacco Use  . Smoking status: Former Smoker    Types: Cigarettes    Quit date: 11/12/2006    Years since quitting: 13.7  . Smokeless tobacco: Never Used  Substance Use Topics  . Alcohol use: No     Current Meds  Medication Sig  . buPROPion (WELLBUTRIN SR) 100 MG 12 hr tablet Take 1 tablet (100 mg total) by mouth daily.  Marland Kitchen estradiol  (VIVELLE-DOT) 0.1 MG/24HR patch Place 1 patch onto the skin 2 (two) times a week.  . Vitamin D, Ergocalciferol, (DRISDOL) 1.25 MG (50000 UNIT) CAPS capsule Take 1 capsule (50,000 Units total) by mouth every 7 (seven) days.    ROS:  Review of Systems  Constitutional: Negative for fever and malaise/fatigue.  Eyes: Negative for blurred vision.  Respiratory: Negative for shortness of breath.   Cardiovascular: Negative for chest pain.  Neurological: Negative for dizziness and headaches.  Psychiatric/Behavioral: Negative for suicidal ideas. The patient is nervous/anxious.      Objective:   Today's Vitals: BP 128/78   Pulse 61   Temp (!) 96.9 F (36.1 C) (Temporal)   Ht 5\' 5"  (1.651 m)   Wt 228 lb 6.4 oz (103.6 kg)   SpO2 94%   BMI 38.01 kg/m  Vitals with BMI 08/23/2020 08/09/2020 07/13/2020  Height 5\' 5"  5\' 5"  5\' 5"   Weight 228 lbs 6 oz 232 lbs 232 lbs  BMI 38.01 68.12 75.17  Systolic 001 [id number] 449  Diastolic 78 80 71  Pulse 61 58 63     Physical Exam Vitals reviewed.  Constitutional:      General: She is  not in acute distress.    Appearance: Normal appearance.  HENT:     Head: Normocephalic and atraumatic.  Cardiovascular:     Rate and Rhythm: Normal rate and regular rhythm.     Pulses: Normal pulses.     Heart sounds: Normal heart sounds.  Pulmonary:     Effort: Pulmonary effort is normal.     Breath sounds: Normal breath sounds.  Skin:    General: Skin is warm and dry.  Neurological:     General: No focal deficit present.     Mental Status: She is alert and oriented to person, place, and time.  Psychiatric:        Mood and Affect: Mood normal.        Behavior: Behavior normal.        Judgment: Judgment normal.          Assessment and Plan   1. Vitamin D deficiency disease   2. Obesity (BMI 30-39.9)      Plan: 1.  She will continue on her vitamin D supplement.  We will hold off on checking serum level today per shared decision making I will plan on  doing this at her subsequent visit which will be her annual exam. 2.  Encouraged her to continue following up with a healthy weight loss clinic and to do her best as far as making lifestyle changes aimed at helping her achieve a healthier weight.  She tells me she will do her best.   Tests ordered No orders of the defined types were placed in this encounter.     No orders of the defined types were placed in this encounter.   Patient to follow-up in 3 to 6 months for annual physical exam, or sooner as needed.  Ailene Ards, NP

## 2020-08-24 ENCOUNTER — Other Ambulatory Visit (INDEPENDENT_AMBULATORY_CARE_PROVIDER_SITE_OTHER): Payer: Self-pay | Admitting: Family Medicine

## 2020-08-24 ENCOUNTER — Encounter (INDEPENDENT_AMBULATORY_CARE_PROVIDER_SITE_OTHER): Payer: Self-pay | Admitting: Family Medicine

## 2020-08-24 ENCOUNTER — Ambulatory Visit (INDEPENDENT_AMBULATORY_CARE_PROVIDER_SITE_OTHER): Payer: No Typology Code available for payment source | Admitting: Family Medicine

## 2020-08-24 VITALS — BP 120/69 | HR 62 | Temp 98.4°F | Ht 65.0 in | Wt 227.0 lb

## 2020-08-24 DIAGNOSIS — Z6837 Body mass index (BMI) 37.0-37.9, adult: Secondary | ICD-10-CM

## 2020-08-24 DIAGNOSIS — Z9189 Other specified personal risk factors, not elsewhere classified: Secondary | ICD-10-CM | POA: Diagnosis not present

## 2020-08-24 DIAGNOSIS — F3289 Other specified depressive episodes: Secondary | ICD-10-CM

## 2020-08-24 DIAGNOSIS — E559 Vitamin D deficiency, unspecified: Secondary | ICD-10-CM

## 2020-08-24 MED ORDER — VITAMIN D (ERGOCALCIFEROL) 1.25 MG (50000 UNIT) PO CAPS: 50000.0000 [IU] | ORAL_CAPSULE | ORAL | 0 refills | Status: DC

## 2020-08-24 MED ORDER — BUPROPION HCL ER (SR) 100 MG PO TB12: 100.0000 mg | ORAL_TABLET | Freq: Every day | ORAL | 0 refills | Status: DC

## 2020-08-29 NOTE — Progress Notes (Signed)
Chief Complaint:   OBESITY Nancy Gilmore is here to discuss her progress with her obesity treatment plan along with follow-up of her obesity related diagnoses. Miranda is on the Category 2 Plan +100 calories and states she is following her eating plan approximately 30% of the time. Navea states she is doing yard work for exercise.  Today's visit was #: 7 Starting weight: 236 lbs Starting date: 04/20/2020 Today's weight: 227 lbs Today's date: 08/24/2020 Total lbs lost to date: 9 lbs Total lbs lost since last in-office visit: 5 lbs  Interim History: Trisha says the last few weeks have been busy, but she is feeling less anxious and down.  Food-wise, she is making better choices and not eating as many snacks.  Breakfast is either option.  For lunch, she says it is easy to follow when she is working.  Dinner-wise, she is not eating as much.  She is normally doing 100 calorie packs or grapes for snacks.  Subjective:   1. Vitamin D deficiency Nancy Gilmore's Vitamin D level was 37.0 on 04/20/2020. She is currently taking prescription vitamin D 50,000 IU each week. She denies nausea, vomiting or muscle weakness.  She endorses fatigue.  2. Other depression, with emotional eating Denies SI/HI.  Symptoms are better with bupropion.  Blood pressure is well-controlled.  3. At risk for deficient intake of food The patient is at a higher than average risk of deficient intake of food due to not weighing/measuring protein amount daily.  Assessment/Plan:   1. Vitamin D deficiency Low Vitamin D level contributes to fatigue and are associated with obesity, breast, and colon cancer. She agrees to continue to take prescription Vitamin D @50 ,000 IU every week and will follow-up for routine testing of Vitamin D, at least 2-3 times per year to avoid over-replacement.  -Refill Vitamin D, Ergocalciferol, (DRISDOL) 1.25 MG (50000 UNIT) CAPS capsule; Take 1 capsule (50,000 Units total) by mouth every 7 (seven) days.   Dispense: 4 capsule; Refill: 0  2. Other depression, with emotional eating Refill Wellbutrin, as per below.  -Refill buPROPion (WELLBUTRIN SR) 100 MG 12 hr tablet; Take 1 tablet (100 mg total) by mouth daily.  Dispense: 30 tablet; Refill: 0  3. At risk for deficient intake of food [name] was given approximately 15 minutes of deficit intake of food prevention counseling today. Nancy Gilmore is at risk for eating too few calories based on current food recall. She was encouraged to focus on meeting caloric and protein goals according to her recommended meal plan.   4. Class 2 severe obesity with serious comorbidity and body mass index (BMI) of 37.0 to 37.9 in adult, unspecified obesity type [facility])  Ahley is currently in the action stage of change. As such, her goal is to continue with weight loss efforts. She has agreed to the Category 2 Plan +100 calories.   Exercise goals: All adults should avoid inactivity. Some physical activity is better than none, and adults who participate in any amount of physical activity gain some health benefits.  Behavioral modification strategies: increasing lean protein intake, meal planning and cooking strategies, keeping healthy foods in the home and planning for success.  Nancy Gilmore has agreed to follow-up with our clinic in 3 weeks. She was informed of the importance of frequent follow-up visits to maximize her success with intensive lifestyle modifications for her multiple health conditions.   Objective:   Blood pressure 120/69, pulse 62, temperature 98.4 F (36.9 C), temperature source Oral, height 5\' 5"  (1.651 m), weight  227 lb (103 kg), SpO2 97 %. Body mass index is 37.77 kg/m.  General: Cooperative, alert, well developed, in no acute distress. HEENT: Conjunctivae and lids unremarkable. Cardiovascular: Regular rhythm.  Lungs: Normal work of breathing. Neurologic: No focal deficits.   Lab Results  Component Value Date   CREATININE 0.71 04/20/2020   BUN  12 04/20/2020   NA 141 04/20/2020   K 4.5 04/20/2020   CL 102 04/20/2020   CO2 23 04/20/2020   Lab Results  Component Value Date   ALT 16 04/20/2020   AST 21 04/20/2020   ALKPHOS 68 04/20/2020   BILITOT 0.3 04/20/2020   Lab Results  Component Value Date   HGBA1C 5.4 04/20/2020   HGBA1C 5.4 01/12/2020   HGBA1C  12/15/2009    5.5 (NOTE) The ADA recommends the following therapeutic goal for glycemic control related to Hgb A1c measurement: Goal of therapy: <6.5 Hgb A1c  Reference: American Diabetes Association: Clinical Practice Recommendations 2010, Diabetes Care, 2010, 33: (Suppl  1).   Lab Results  Component Value Date   INSULIN 18.1 04/20/2020   Lab Results  Component Value Date   TSH 1.350 04/20/2020   Lab Results  Component Value Date   CHOL 178 04/20/2020   HDL 87 04/20/2020   LDLCALC 80 04/20/2020   TRIG 57 04/20/2020   CHOLHDL 3.0 01/12/2020   Lab Results  Component Value Date   WBC 6.4 04/20/2020   HGB 12.6 04/20/2020   HCT 39.2 04/20/2020   MCV 92 04/20/2020   PLT 236 04/20/2020   Attestation Statements:   Reviewed by clinician on day of visit: allergies, medications, problem list, medical history, surgical history, family history, social history, and previous encounter notes.  I, Water quality scientist, CMA, am acting as transcriptionist for Coralie Common, MD.  I have reviewed the above documentation for accuracy and completeness, and I agree with the above. - Jinny Blossom, MD

## 2020-08-31 MED FILL — VIT D2 1.25 MG (50,000 UNIT: 1.25 MG | 28 days supply | Qty: 4 | Fill #0

## 2020-09-02 MED FILL — buPROPion HCL ER (SR) 100 M: 100 | 30 days supply | Qty: 30 | Fill #0

## 2020-09-10 MED FILL — ESTRADIOL 0.1 MG/24HR PTTW: 0.1 | 84 days supply | Qty: 24 | Fill #3

## 2020-09-21 ENCOUNTER — Encounter (INDEPENDENT_AMBULATORY_CARE_PROVIDER_SITE_OTHER): Payer: Self-pay | Admitting: Bariatrics

## 2020-09-21 ENCOUNTER — Other Ambulatory Visit: Payer: Self-pay

## 2020-09-21 ENCOUNTER — Other Ambulatory Visit (INDEPENDENT_AMBULATORY_CARE_PROVIDER_SITE_OTHER): Payer: Self-pay | Admitting: Bariatrics

## 2020-09-21 ENCOUNTER — Ambulatory Visit (INDEPENDENT_AMBULATORY_CARE_PROVIDER_SITE_OTHER): Payer: No Typology Code available for payment source | Admitting: Bariatrics

## 2020-09-21 VITALS — BP 135/82 | HR 67 | Temp 98.0°F | Ht 65.0 in | Wt 230.0 lb

## 2020-09-21 DIAGNOSIS — E559 Vitamin D deficiency, unspecified: Secondary | ICD-10-CM | POA: Diagnosis not present

## 2020-09-21 DIAGNOSIS — Z6838 Body mass index (BMI) 38.0-38.9, adult: Secondary | ICD-10-CM

## 2020-09-21 DIAGNOSIS — F3289 Other specified depressive episodes: Secondary | ICD-10-CM | POA: Diagnosis not present

## 2020-09-21 DIAGNOSIS — Z9189 Other specified personal risk factors, not elsewhere classified: Secondary | ICD-10-CM | POA: Diagnosis not present

## 2020-09-21 MED ORDER — VITAMIN D (ERGOCALCIFEROL) 1.25 MG (50000 UNIT) PO CAPS: 50000.0000 [IU] | ORAL_CAPSULE | ORAL | 0 refills | Status: DC

## 2020-09-21 MED ORDER — BUPROPION HCL ER (SR) 150 MG PO TB12: 150.0000 mg | ORAL_TABLET | Freq: Every day | ORAL | 0 refills | Status: DC

## 2020-09-21 NOTE — Progress Notes (Signed)
Chief Complaint:   OBESITY Nancy Gilmore is here to discuss her progress with her obesity treatment plan along with follow-up of her obesity related diagnoses. Nancy Gilmore is on the Category 2 Plan + 100 calories and states she is following her eating plan approximately 50% of the time. Nancy Gilmore states she is walking 60 minutes 2 times per week.  Today's visit was #: 8 Starting weight: 236 lbs Starting date: 04/20/2020 Today's weight: 230 lbs Today's date: 09/21/2020 Total lbs lost to date: 6 Total lbs lost since last in-office visit: 0  Interim History: Nancy Gilmore is up 3 lbs since her last visit. She has been to more events and has eaten more. She reports not getting enough water.  Subjective:   Other depression, with emotional eating. Nancy Gilmore is struggling with emotional eating and using food for comfort to the extent that it is negatively impacting her health. She has been working on behavior modification techniques to help reduce her emotional eating and has been somewhat successful. She shows no sign of suicidal or homicidal ideations. 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 She is on bupropion and states it helps to level out her mood and increase energy. She does endorse stress eating.  Vitamin D deficiency. Nancy Gilmore reports minimal sun exposure.   Ref. Range 04/20/2020 11:02  Vitamin D, 25-Hydroxy Latest Ref Range: 30.0 - 100.0 ng/mL 37.0   At risk for osteoporosis. Nancy Gilmore is at higher risk of osteopenia and osteoporosis due to Vitamin D deficiency.   Assessment/Plan:   Other depression, with emotional eating. Behavior modification techniques were discussed today to help Nancy Gilmore deal with her emotional/non-hunger eating behaviors.  Orders and follow up as documented in patient record. Prescription was given for increased dose of buPROPion (WELLBUTRIN SR) to 150 MG (from 100 mg) 12 hr tablet 1 daily #30 with 0 refills.  Vitamin D deficiency. Low Vitamin D level  contributes to fatigue and are associated with obesity, breast, and colon cancer. She was given a prescription for Vitamin D, Ergocalciferol, (DRISDOL) 1.25 MG (50000 UNIT) CAPS capsule every week #4 with 0 refills and will follow-up for routine testing of Vitamin D, at least 2-3 times per year to avoid over-replacement.   At risk for osteoporosis. Nancy Gilmore was given approximately 15 minutes of osteoporosis prevention counseling today. Nancy Gilmore is at risk for osteopenia and osteoporosis due to her Vitamin D deficiency. She was encouraged to take her Vitamin D and follow her higher calcium diet and increase strengthening exercise to help strengthen her bones and decrease her risk of osteopenia and osteoporosis.  Repetitive spaced learning was employed today to elicit superior memory formation and behavioral change.  Class 2 severe obesity with serious comorbidity and body mass index (BMI) of 38.0 to 38.9 in adult, unspecified obesity type ([place]).  Nancy Gilmore is currently in the action stage of change. As such, her goal is to continue with weight loss efforts. She has agreed to the Category 2 Plan + 100 calories.  She will adhere more closely to the plan, work on meal planning, and increase her water and protein intake.  Handout was provided on Protein Equivalents.   Exercise goals: Nancy Gilmore will continue walking 60 minutes 2 times per week.  Behavioral modification strategies: increasing lean protein intake, decreasing simple carbohydrates, increasing vegetables, increasing water intake, decreasing eating out, no skipping meals, meal planning and cooking strategies, keeping healthy foods in the home and planning for success.  Nancy Gilmore has agreed to follow-up with our clinic  in 2-3 weeks. She was informed of the importance of frequent follow-up visits to maximize her success with intensive lifestyle modifications for her multiple health conditions.   Objective:   Blood pressure 135/82, pulse 67,  temperature 98 F (36.7 C), temperature source Oral, height 5\' 5"  (1.651 m), weight 230 lb (104.3 kg), SpO2 97 %. Body mass index is 38.27 kg/m.  General: Cooperative, alert, well developed, in no acute distress. HEENT: Conjunctivae and lids unremarkable. Cardiovascular: Regular rhythm.  Lungs: Normal work of breathing. Neurologic: No focal deficits.   Lab Results  Component Value Date   CREATININE 0.71 04/20/2020   BUN 12 04/20/2020   NA 141 04/20/2020   K 4.5 04/20/2020   CL 102 04/20/2020   CO2 23 04/20/2020   Lab Results  Component Value Date   ALT 16 04/20/2020   AST 21 04/20/2020   ALKPHOS 68 04/20/2020   BILITOT 0.3 04/20/2020   Lab Results  Component Value Date   HGBA1C 5.4 04/20/2020   HGBA1C 5.4 01/12/2020   HGBA1C  12/15/2009    5.5 (NOTE) The ADA recommends the following therapeutic goal for glycemic control related to Hgb A1c measurement: Goal of therapy: <6.5 Hgb A1c  Reference: American Diabetes Association: Clinical Practice Recommendations 2010, Diabetes Care, 2010, 33: (Suppl  1).   Lab Results  Component Value Date   INSULIN 18.1 04/20/2020   Lab Results  Component Value Date   TSH 1.350 04/20/2020   Lab Results  Component Value Date   CHOL 178 04/20/2020   HDL 87 04/20/2020   LDLCALC 80 04/20/2020   TRIG 57 04/20/2020   CHOLHDL 3.0 01/12/2020   Lab Results  Component Value Date   WBC 6.4 04/20/2020   HGB 12.6 04/20/2020   HCT 39.2 04/20/2020   MCV 92 04/20/2020   PLT 236 04/20/2020   No results found for: IRON, TIBC, FERRITIN  Attestation Statements:   Reviewed by clinician on day of visit: allergies, medications, problem list, medical history, surgical history, family history, social history, and previous encounter notes.  Migdalia Dk, am acting as Location manager for [employer], DO   I have reviewed the above documentation for accuracy and completeness, and I agree with the above. Nancy Lesch, DO

## 2020-09-22 ENCOUNTER — Encounter (INDEPENDENT_AMBULATORY_CARE_PROVIDER_SITE_OTHER): Payer: Self-pay | Admitting: Bariatrics

## 2020-09-22 MED FILL — VIT D2 1.25 MG (50,000 UNIT: 1.25 MG | 28 days supply | Qty: 4 | Fill #0

## 2020-09-28 MED FILL — BUPROPION HCL SR 150 MG TAB: 150 | 30 days supply | Qty: 30 | Fill #0

## 2020-10-10 ENCOUNTER — Other Ambulatory Visit: Payer: Self-pay | Admitting: Obstetrics and Gynecology

## 2020-10-10 DIAGNOSIS — Z1231 Encounter for screening mammogram for malignant neoplasm of breast: Secondary | ICD-10-CM

## 2020-10-13 ENCOUNTER — Other Ambulatory Visit: Payer: Self-pay

## 2020-10-13 ENCOUNTER — Encounter (INDEPENDENT_AMBULATORY_CARE_PROVIDER_SITE_OTHER): Payer: Self-pay | Admitting: Bariatrics

## 2020-10-13 ENCOUNTER — Ambulatory Visit (INDEPENDENT_AMBULATORY_CARE_PROVIDER_SITE_OTHER): Payer: No Typology Code available for payment source | Admitting: Bariatrics

## 2020-10-13 ENCOUNTER — Other Ambulatory Visit (INDEPENDENT_AMBULATORY_CARE_PROVIDER_SITE_OTHER): Payer: Self-pay | Admitting: Bariatrics

## 2020-10-13 VITALS — BP 125/74 | HR 65 | Temp 97.7°F | Ht 65.0 in | Wt 232.0 lb

## 2020-10-13 DIAGNOSIS — Z9189 Other specified personal risk factors, not elsewhere classified: Secondary | ICD-10-CM

## 2020-10-13 DIAGNOSIS — R7309 Other abnormal glucose: Secondary | ICD-10-CM | POA: Diagnosis not present

## 2020-10-13 DIAGNOSIS — F3289 Other specified depressive episodes: Secondary | ICD-10-CM | POA: Diagnosis not present

## 2020-10-13 DIAGNOSIS — E559 Vitamin D deficiency, unspecified: Secondary | ICD-10-CM

## 2020-10-13 DIAGNOSIS — Z6838 Body mass index (BMI) 38.0-38.9, adult: Secondary | ICD-10-CM

## 2020-10-13 MED ORDER — VITAMIN D (ERGOCALCIFEROL) 1.25 MG (50000 UNIT) PO CAPS: 50000.0000 [IU] | ORAL_CAPSULE | ORAL | 0 refills | Status: DC

## 2020-10-13 MED ORDER — BUPROPION HCL ER (SR) 150 MG PO TB12: 150.0000 mg | ORAL_TABLET | Freq: Every day | ORAL | 0 refills | Status: DC

## 2020-10-13 NOTE — Progress Notes (Signed)
Chief Complaint:   OBESITY Nancy Gilmore is here to discuss her progress with her obesity treatment plan along with follow-up of her obesity related diagnoses. Nancy Gilmore is on the Category 2 Plan + 100 calories and states she is following her eating plan approximately 50% of the time. Nancy Gilmore states she is doing yoga 60 minutes 2 times per week.  Today's visit was #: 9 Starting weight: 236 lbs Starting date: 04/20/2020 Today's weight: 232 lbs Today's date: 10/13/2020 Total lbs lost to date: 4 Total lbs lost since last in-office visit: 0  Interim History: Nancy Gilmore is up 2 lbs since her last visit.  Subjective:   Vitamin D deficiency. No nausea, vomiting, or muscle weakness.    Ref. Range 04/20/2020 11:02  Vitamin D, 25-Hydroxy Latest Ref Range: 30.0 - 100.0 ng/mL 37.0   Other depression, with emotional eating. Nancy Gilmore is struggling with emotional eating and using food for comfort to the extent that it is negatively impacting her health. She has been working on behavior modification techniques to help reduce her emotional eating and has been somewhat successful. She shows no sign of suicidal or homicidal ideations. Nancy Gilmore is taking Wellbutrin, which she reports is helping with cravings.  Elevated glucose. Last glucose level was 88 on 04/20/2020.  At risk for osteoporosis. Nancy Gilmore is at higher risk of osteopenia and osteoporosis due to Vitamin D deficiency.   Assessment/Plan:   Vitamin D deficiency. Low Vitamin D level contributes to fatigue and are associated with obesity, breast, and colon cancer. She was given a prescription for Vitamin D, Ergocalciferol, (DRISDOL) 1.25 MG (50000 UNIT) CAPS capsule every week and VITAMIN D 25 Hydroxy (Vit-D Deficiency, Fractures) level will be checked today.   Other depression, with emotional eating. Behavior modification techniques were discussed today to help Nancy Gilmore deal with her emotional/non-hunger eating behaviors.  Orders and follow up as  documented in patient record. Prescription was given for buPROPion (WELLBUTRIN SR) 150 MG 12 hr tablet 1 PO daily #30 with 0 refills. She will prioritize her activities.  Elevated glucose.  Comprehensive metabolic panel, Insulin, random labs will be checked today.   At risk for osteoporosis. Nancy Gilmore was given approximately 15 minutes of osteoporosis prevention counseling today. Nancy Gilmore is at risk for osteopenia and osteoporosis due to her Vitamin D deficiency. She was encouraged to take her Vitamin D and follow her higher calcium diet and increase strengthening exercise to help strengthen her bones and decrease her risk of osteopenia and osteoporosis.  Repetitive spaced learning was employed today to elicit superior memory formation and behavioral change.  Class 2 severe obesity with serious comorbidity and body mass index (BMI) of 38.0 to 38.9 in adult, unspecified obesity type (Nancy Gilmore).  Nancy Gilmore is currently in the action stage of change. As such, her goal is to continue with weight loss efforts. She has agreed to the Category 2 Plan + 100 calories.    She will be more adherent to the plan.  Handout was provided for "[place]."  Exercise goals: All adults should avoid inactivity. Some physical activity is better than none, and adults who participate in any amount of physical activity gain some health benefits.  Behavioral modification strategies: increasing lean protein intake, decreasing simple carbohydrates, increasing vegetables, increasing water intake, decreasing eating out, no skipping meals, meal planning and cooking strategies and keeping healthy foods in the home.  Nancy Gilmore has agreed to follow-up with our clinic fasting in 3 weeks. She was informed of the importance of frequent  follow-up visits to maximize her success with intensive lifestyle modifications for her multiple health conditions.   Objective:   Blood pressure 125/74, pulse 65, temperature 97.7 F (36.5 C), height 5\' 5"   (1.651 m), weight 232 lb (105.2 kg), SpO2 97 %. Body mass index is 38.61 kg/m.  General: Cooperative, alert, well developed, in no acute distress. HEENT: Conjunctivae and lids unremarkable. Cardiovascular: Regular rhythm.  Lungs: Normal work of breathing. Neurologic: No focal deficits.   Lab Results  Component Value Date   CREATININE 0.71 04/20/2020   BUN 12 04/20/2020   NA 141 04/20/2020   K 4.5 04/20/2020   CL 102 04/20/2020   CO2 23 04/20/2020   Lab Results  Component Value Date   ALT 16 04/20/2020   AST 21 04/20/2020   ALKPHOS 68 04/20/2020   BILITOT 0.3 04/20/2020   Lab Results  Component Value Date   HGBA1C 5.4 04/20/2020   HGBA1C 5.4 01/12/2020   HGBA1C  12/15/2009    5.5 (NOTE) The ADA recommends the following therapeutic goal for glycemic control related to Hgb A1c measurement: Goal of therapy: <6.5 Hgb A1c  Reference: American Diabetes Association: Clinical Practice Recommendations 2010, Diabetes Care, 2010, 33: (Suppl  1).   Lab Results  Component Value Date   INSULIN 18.1 04/20/2020   Lab Results  Component Value Date   TSH 1.350 04/20/2020   Lab Results  Component Value Date   CHOL 178 04/20/2020   HDL 87 04/20/2020   LDLCALC 80 04/20/2020   TRIG 57 04/20/2020   CHOLHDL 3.0 01/12/2020   Lab Results  Component Value Date   WBC 6.4 04/20/2020   HGB 12.6 04/20/2020   HCT 39.2 04/20/2020   MCV 92 04/20/2020   PLT 236 04/20/2020   No results found for: IRON, TIBC, FERRITIN  Attestation Statements:   Reviewed by clinician on day of visit: allergies, medications, problem list, medical history, surgical history, family history, social history, and previous encounter notes.  Migdalia Dk, am acting as Location manager for [employer], DO   I have reviewed the above documentation for accuracy and completeness, and I agree with the above. Jearld Lesch, DO

## 2020-10-14 LAB — COMPREHENSIVE METABOLIC PANEL
ALT: 16 IU/L (ref 0–32)
AST: 15 IU/L (ref 0–40)
Albumin/Globulin Ratio: 1.7 (ref 1.2–2.2)
Albumin: 4.3 g/dL (ref 3.8–4.9)
Alkaline Phosphatase: 56 IU/L (ref 44–121)
BUN/Creatinine Ratio: 16 (ref 9–23)
BUN: 12 mg/dL (ref 6–24)
Bilirubin Total: 0.4 mg/dL (ref 0.0–1.2)
CO2: 25 mmol/L (ref 20–29)
Calcium: 9.1 mg/dL (ref 8.7–10.2)
Chloride: 105 mmol/L (ref 96–106)
Creatinine, Ser: 0.76 mg/dL (ref 0.57–1.00)
GFR calc Af Amer: 103 mL/min/{1.73_m2} (ref >59)
GFR calc non Af Amer: 89 mL/min/{1.73_m2} (ref >59)
Globulin, Total: 2.6 g/dL (ref 1.5–4.5)
Glucose: 94 mg/dL (ref 65–99)
Potassium: 4.6 mmol/L (ref 3.5–5.2)
Sodium: 142 mmol/L (ref 134–144)
Total Protein: 6.9 g/dL (ref 6.0–8.5)

## 2020-10-14 LAB — INSULIN, RANDOM: INSULIN: 21.9 u[IU]/mL (ref 2.6–24.9)

## 2020-10-14 LAB — VITAMIN D 25 HYDROXY (VIT D DEFICIENCY, FRACTURES): Vit D, 25-Hydroxy: 47.2 ng/mL (ref 30.0–100.0)

## 2020-10-14 MED FILL — VIT D2 1.25 MG (50,000 UNIT: 1.25 MG | 28 days supply | Qty: 4 | Fill #0

## 2020-10-24 ENCOUNTER — Encounter (INDEPENDENT_AMBULATORY_CARE_PROVIDER_SITE_OTHER): Payer: Self-pay | Admitting: Bariatrics

## 2020-11-02 ENCOUNTER — Encounter (INDEPENDENT_AMBULATORY_CARE_PROVIDER_SITE_OTHER): Payer: Self-pay | Admitting: Bariatrics

## 2020-11-02 ENCOUNTER — Other Ambulatory Visit (INDEPENDENT_AMBULATORY_CARE_PROVIDER_SITE_OTHER): Payer: Self-pay | Admitting: Bariatrics

## 2020-11-02 ENCOUNTER — Other Ambulatory Visit: Payer: Self-pay

## 2020-11-02 ENCOUNTER — Ambulatory Visit (INDEPENDENT_AMBULATORY_CARE_PROVIDER_SITE_OTHER): Payer: No Typology Code available for payment source | Admitting: Bariatrics

## 2020-11-02 VITALS — BP 128/75 | HR 63 | Temp 98.3°F | Ht 65.0 in | Wt 231.0 lb

## 2020-11-02 DIAGNOSIS — F5089 Other specified eating disorder: Secondary | ICD-10-CM

## 2020-11-02 DIAGNOSIS — E559 Vitamin D deficiency, unspecified: Secondary | ICD-10-CM

## 2020-11-02 DIAGNOSIS — Z9189 Other specified personal risk factors, not elsewhere classified: Secondary | ICD-10-CM

## 2020-11-02 DIAGNOSIS — Z6838 Body mass index (BMI) 38.0-38.9, adult: Secondary | ICD-10-CM

## 2020-11-02 MED ORDER — BUPROPION HCL ER (SR) 150 MG PO TB12: 150.0000 mg | ORAL_TABLET | Freq: Every day | ORAL | 0 refills | Status: DC

## 2020-11-02 MED ORDER — VITAMIN D (ERGOCALCIFEROL) 1.25 MG (50000 UNIT) PO CAPS
50000.0000 [IU] | ORAL_CAPSULE | ORAL | 0 refills | Status: DC
Start: 2020-11-02 — End: 2020-11-29

## 2020-11-02 MED FILL — BUPROPION HCL SR 150 MG TAB: 150 | 30 days supply | Qty: 30 | Fill #0

## 2020-11-02 NOTE — Progress Notes (Signed)
Chief Complaint:   OBESITY Nancy Gilmore is here to discuss her progress with her obesity treatment plan along with follow-up of her obesity related diagnoses. Nancy Gilmore is on the Category 2 Plan + 100 calories and states she is following her eating plan approximately 30% of the time. Nancy Gilmore states she is doing yoga 60 minutes 2-3 times per week.  Today's visit was #: 10 Starting weight: 236 lbs Starting date: 04/20/2020 Today's weight: 231 lbs Today's date: 11/02/2020 Total lbs lost to date: 5 Total lbs lost since last in-office visit: 1  Interim History: Nancy Gilmore is down 1 lb - "crazy holidays."  Subjective:   Vitamin D deficiency. Nancy Gilmore endorses minimal to no sunlight exposure.   Ref. Range 10/13/2020 08:56  Vitamin D, 25-Hydroxy Latest Ref Range: 30.0 - 100.0 ng/mL 47.2   Other disorder of eating. Nancy Gilmore is taking Wellbutrin, which she reports is helping with cravings.  At risk for activity intolerance. Nancy Gilmore is at risk of exercise intolerance due to weather and the holidays.  Assessment/Plan:   Vitamin D deficiency. Low Vitamin D level contributes to fatigue and are associated with obesity, breast, and colon cancer. She was given a prescription for Vitamin D, Ergocalciferol, (DRISDOL) 1.25 MG (50000 UNIT) CAPS capsule every week #4 with 0 refills and will follow-up for routine testing of Vitamin D, at least 2-3 times per year to avoid over-replacement.   Other disorder of eating. Prescription was given for buPROPion (WELLBUTRIN SR) 150 MG 12 hr tablet 1 PO daily #30 with 0 refills.  At risk for activity intolerance. Nancy Gilmore was given approximately 15 minutes of exercise intolerance counseling today. She is 54 y.o. female and has risk factors exercise intolerance including obesity. We discussed intensive lifestyle modifications today with an emphasis on specific weight loss instructions and strategies. Nancy Gilmore will slowly increase activity as tolerated.  Repetitive  spaced learning was employed today to elicit superior memory formation and behavioral change.  Class 2 severe obesity with serious comorbidity and body mass index (BMI) of 38.0 to 38.9 in adult, unspecified obesity type ([place]).  Nancy Gilmore is currently in the action stage of change. As such, her goal is to continue with weight loss efforts. She has agreed to the Category 2 Plan + 100 calories.   We reviewed with the patient labs from 10/13/2020 including CMP, Vitamin D, insulin, and glucose.  Exercise goals: [name] will continue yoga for exercise.  Behavioral modification strategies: increasing lean protein intake, decreasing simple carbohydrates, increasing vegetables, increasing water intake, decreasing eating out, no skipping meals, meal planning and cooking strategies, keeping healthy foods in the home, dealing with family or coworker sabotage, holiday eating strategies , celebration eating strategies, avoiding temptations, planning for success and keeping a strict food journal.  Nancy Gilmore has agreed to follow-up with our clinic in 3-4 weeks. She was informed of the importance of frequent follow-up visits to maximize her success with intensive lifestyle modifications for her multiple health conditions.   Objective:   Blood pressure 128/75, pulse 63, temperature 98.3 F (36.8 C), temperature source Oral, height 5\' 5"  (1.651 m), weight 231 lb (104.8 kg), SpO2 96 %. Body mass index is 38.44 kg/m.  General: Cooperative, alert, well developed, in no acute distress. HEENT: Conjunctivae and lids unremarkable. Cardiovascular: Regular rhythm.  Lungs: Normal work of breathing. Neurologic: No focal deficits.   Lab Results  Component Value Date   CREATININE 0.76 10/13/2020   BUN 12 10/13/2020   NA 142 10/13/2020   K 4.6  10/13/2020   CL 105 10/13/2020   CO2 25 10/13/2020   Lab Results  Component Value Date   ALT 16 10/13/2020   AST 15 10/13/2020   ALKPHOS 56 10/13/2020   BILITOT 0.4  10/13/2020   Lab Results  Component Value Date   HGBA1C 5.4 04/20/2020   HGBA1C 5.4 01/12/2020   HGBA1C  12/15/2009    5.5 (NOTE) The ADA recommends the following therapeutic goal for glycemic control related to Hgb A1c measurement: Goal of therapy: <6.5 Hgb A1c  Reference: American Diabetes Association: Clinical Practice Recommendations 2010, Diabetes Care, 2010, 33: (Suppl  1).   Lab Results  Component Value Date   INSULIN 21.9 10/13/2020   INSULIN 18.1 04/20/2020   Lab Results  Component Value Date   TSH 1.350 04/20/2020   Lab Results  Component Value Date   CHOL 178 04/20/2020   HDL 87 04/20/2020   LDLCALC 80 04/20/2020   TRIG 57 04/20/2020   CHOLHDL 3.0 01/12/2020   Lab Results  Component Value Date   WBC 6.4 04/20/2020   HGB 12.6 04/20/2020   HCT 39.2 04/20/2020   MCV 92 04/20/2020   PLT 236 04/20/2020   No results found for: IRON, TIBC, FERRITIN  Attestation Statements:   Reviewed by clinician on day of visit: allergies, medications, problem list, medical history, surgical history, family history, social history, and previous encounter notes.  Migdalia Dk, am acting as Location manager for [employer], DO   I have reviewed the above documentation for accuracy and completeness, and I agree with the above. Jearld Lesch, DO

## 2020-11-07 MED FILL — VIT D2 1.25 MG (50,000 UNIT: 1.25 MG | 28 days supply | Qty: 4 | Fill #0

## 2020-11-24 ENCOUNTER — Other Ambulatory Visit: Payer: Self-pay

## 2020-11-24 ENCOUNTER — Ambulatory Visit
Admission: RE | Admit: 2020-11-24 | Discharge: 2020-11-24 | Disposition: A | Discharge status: Home / Self Care | Payer: No Typology Code available for payment source | Source: Ambulatory Visit | Attending: Obstetrics and Gynecology | Admitting: Obstetrics and Gynecology

## 2020-11-24 DIAGNOSIS — Z1231 Encounter for screening mammogram for malignant neoplasm of breast: Secondary | ICD-10-CM

## 2020-11-25 ENCOUNTER — Other Ambulatory Visit: Payer: Self-pay | Admitting: Obstetrics and Gynecology

## 2020-11-25 DIAGNOSIS — R928 Other abnormal and inconclusive findings on diagnostic imaging of breast: Secondary | ICD-10-CM

## 2020-11-29 ENCOUNTER — Other Ambulatory Visit (INDEPENDENT_AMBULATORY_CARE_PROVIDER_SITE_OTHER): Payer: Self-pay | Admitting: Bariatrics

## 2020-11-29 ENCOUNTER — Telehealth (INDEPENDENT_AMBULATORY_CARE_PROVIDER_SITE_OTHER): Payer: No Typology Code available for payment source | Admitting: Bariatrics

## 2020-11-29 ENCOUNTER — Encounter (INDEPENDENT_AMBULATORY_CARE_PROVIDER_SITE_OTHER): Payer: Self-pay | Admitting: Bariatrics

## 2020-11-29 DIAGNOSIS — E559 Vitamin D deficiency, unspecified: Secondary | ICD-10-CM | POA: Diagnosis not present

## 2020-11-29 DIAGNOSIS — Z6838 Body mass index (BMI) 38.0-38.9, adult: Secondary | ICD-10-CM

## 2020-11-29 DIAGNOSIS — F5089 Other specified eating disorder: Secondary | ICD-10-CM | POA: Diagnosis not present

## 2020-11-29 MED ORDER — VITAMIN D (ERGOCALCIFEROL) 1.25 MG (50000 UNIT) PO CAPS: 50000.0000 [IU] | ORAL_CAPSULE | ORAL | 0 refills | Status: DC

## 2020-11-29 MED ORDER — BUPROPION HCL ER (SR) 150 MG PO TB12
150.0000 mg | ORAL_TABLET | Freq: Every day | ORAL | 0 refills | Status: DC
Start: 2020-11-29 — End: 2020-12-28

## 2020-11-29 MED FILL — VIT D2 1.25 MG (50,000 UNIT: 1.25 MG | 28 days supply | Qty: 4 | Fill #0

## 2020-11-29 MED FILL — BUPROPION HCL SR 150 MG TAB: 150 | 30 days supply | Qty: 30 | Fill #0

## 2020-11-30 NOTE — Progress Notes (Signed)
TeleHealth Visit:  Due to the COVID-19 pandemic, this visit was completed with telemedicine (audio/video) technology to reduce patient and provider exposure as well as to preserve personal protective equipment.   [name] has verbally consented to this TeleHealth visit. The patient is located at home, the provider is located at the [facility] office. The participants in this visit include the listed provider and patient. The visit was conducted today via MyChart video.  Chief Complaint: OBESITY Nancy Gilmore is here to discuss her progress with her obesity treatment plan along with follow-up of her obesity related diagnoses. Nancy Gilmore is on the Category 1 Plan +100 calories and states she is following her eating plan approximately 30% of the time. Nancy Gilmore states she is doing yoga for 60 minutes 2 times per week.  Today's visit was #: 11 Starting weight: 236 lbs Starting date: 04/20/2020  Interim History: Nancy Gilmore states that her weight is the same.  Subjective:   1. Vitamin D deficiency Nancy Gilmore's Vitamin D level was 47.2 on 10/13/2020. She is currently taking prescription vitamin D 50,000 IU each week. She denies nausea, vomiting or muscle weakness.  2. Other disorder of eating Nancy Gilmore understands that emotional eating is a major issue.  She is taking Wellbutrin 150 mg daily.  Assessment/Plan:   1. Vitamin D deficiency Low Vitamin D level contributes to fatigue and are associated with obesity, breast, and colon cancer. She agrees to continue to take prescription Vitamin D @50 ,000 IU every week and will follow-up for routine testing of Vitamin D, at least 2-3 times per year to avoid over-replacement.  -Refill Vitamin D, Ergocalciferol, (DRISDOL) 1.25 MG (50000 UNIT) CAPS capsule; Take 1 capsule (50,000 Units total) by mouth every 7 (seven) days.  Dispense: 4 capsule; Refill: 0  2. Other disorder of eating Will refill Wellbutrin, as per below.  She will work with her hands  more.  -Refill buPROPion (WELLBUTRIN SR) 150 MG 12 hr tablet; Take 1 tablet (150 mg total) by mouth daily.  Dispense: 30 tablet; Refill: 0  3. Class 2 severe obesity with serious comorbidity and body mass index (BMI) of 38.0 to 38.9 in adult, unspecified obesity type [facility])  Nancy Gilmore is currently in the action stage of change. As such, her goal is to continue with weight loss efforts. She has agreed to the Category 1 Plan +100 calories.   She will work on meal planning and intentional eating.  Exercise goals: Hand weights and workout videos.  Yoga.  Behavioral modification strategies: increasing lean protein intake, decreasing simple carbohydrates, increasing vegetables, increasing water intake, decreasing eating out, no skipping meals, meal planning and cooking strategies, keeping healthy foods in the home, avoiding temptations and planning for success.  Nancy Gilmore has agreed to follow-up with our clinic in 2-3 weeks. She was informed of the importance of frequent follow-up visits to maximize her success with intensive lifestyle modifications for her multiple health conditions.  Objective:   VITALS: Per patient if applicable, see vitals. GENERAL: Alert and in no acute distress. CARDIOPULMONARY: No increased WOB. Speaking in clear sentences.  PSYCH: Pleasant and cooperative. Speech normal rate and rhythm. Affect is appropriate. Insight and judgement are appropriate. Attention is focused, linear, and appropriate.  NEURO: Oriented as arrived to appointment on time with no prompting.   Lab Results  Component Value Date   CREATININE 0.76 10/13/2020   BUN 12 10/13/2020   NA 142 10/13/2020   K 4.6 10/13/2020   CL 105 10/13/2020   CO2 25 10/13/2020  Lab Results  Component Value Date   ALT 16 10/13/2020   AST 15 10/13/2020   ALKPHOS 56 10/13/2020   BILITOT 0.4 10/13/2020   Lab Results  Component Value Date   HGBA1C 5.4 04/20/2020   HGBA1C 5.4 01/12/2020   HGBA1C  12/15/2009     5.5 (NOTE) The ADA recommends the following therapeutic goal for glycemic control related to Hgb A1c measurement: Goal of therapy: <6.5 Hgb A1c  Reference: American Diabetes Association: Clinical Practice Recommendations 2010, Diabetes Care, 2010, 33: (Suppl  1).   Lab Results  Component Value Date   INSULIN 21.9 10/13/2020   INSULIN 18.1 04/20/2020   Lab Results  Component Value Date   TSH 1.350 04/20/2020   Lab Results  Component Value Date   CHOL 178 04/20/2020   HDL 87 04/20/2020   LDLCALC 80 04/20/2020   TRIG 57 04/20/2020   CHOLHDL 3.0 01/12/2020   Lab Results  Component Value Date   WBC 6.4 04/20/2020   HGB 12.6 04/20/2020   HCT 39.2 04/20/2020   MCV 92 04/20/2020   PLT 236 04/20/2020   Attestation Statements:   Reviewed by clinician on day of visit: allergies, medications, problem list, medical history, surgical history, family history, social history, and previous encounter notes.  I, Water quality scientist, CMA, am acting as Location manager for [employer], DO  I have reviewed the above documentation for accuracy and completeness, and I agree with the above. Jearld Lesch, DO

## 2020-12-01 ENCOUNTER — Encounter (INDEPENDENT_AMBULATORY_CARE_PROVIDER_SITE_OTHER): Payer: Self-pay | Admitting: Bariatrics

## 2020-12-03 MED FILL — ESTRADIOL 0.1 MG/24HR PTTW: 0.1 | 84 days supply | Qty: 24 | Fill #0

## 2020-12-08 ENCOUNTER — Ambulatory Visit
Admission: RE | Admit: 2020-12-08 | Discharge: 2020-12-08 | Disposition: A | Discharge status: Home / Self Care | Payer: No Typology Code available for payment source | Source: Ambulatory Visit | Attending: Obstetrics and Gynecology | Admitting: Obstetrics and Gynecology

## 2020-12-08 ENCOUNTER — Other Ambulatory Visit: Payer: Self-pay

## 2020-12-08 ENCOUNTER — Other Ambulatory Visit: Payer: Self-pay | Admitting: Obstetrics and Gynecology

## 2020-12-08 DIAGNOSIS — R928 Other abnormal and inconclusive findings on diagnostic imaging of breast: Secondary | ICD-10-CM

## 2020-12-09 ENCOUNTER — Other Ambulatory Visit (HOSPITAL_COMMUNITY): Payer: Self-pay | Admitting: Obstetrics and Gynecology

## 2020-12-13 ENCOUNTER — Ambulatory Visit (INDEPENDENT_AMBULATORY_CARE_PROVIDER_SITE_OTHER): Payer: No Typology Code available for payment source | Admitting: Bariatrics

## 2020-12-28 ENCOUNTER — Other Ambulatory Visit (INDEPENDENT_AMBULATORY_CARE_PROVIDER_SITE_OTHER): Payer: Self-pay | Admitting: Adult Health

## 2020-12-28 ENCOUNTER — Other Ambulatory Visit: Payer: Self-pay

## 2020-12-28 ENCOUNTER — Ambulatory Visit (INDEPENDENT_AMBULATORY_CARE_PROVIDER_SITE_OTHER): Payer: No Typology Code available for payment source | Admitting: Adult Health

## 2020-12-28 ENCOUNTER — Encounter (INDEPENDENT_AMBULATORY_CARE_PROVIDER_SITE_OTHER): Payer: Self-pay | Admitting: Adult Health

## 2020-12-28 VITALS — BP 131/77 | HR 66 | Temp 98.8°F | Ht 65.0 in | Wt 235.0 lb

## 2020-12-28 DIAGNOSIS — Z6839 Body mass index (BMI) 39.0-39.9, adult: Secondary | ICD-10-CM

## 2020-12-28 DIAGNOSIS — E8881 Metabolic syndrome: Secondary | ICD-10-CM

## 2020-12-28 DIAGNOSIS — Z9189 Other specified personal risk factors, not elsewhere classified: Secondary | ICD-10-CM

## 2020-12-28 DIAGNOSIS — F5089 Other specified eating disorder: Secondary | ICD-10-CM | POA: Diagnosis not present

## 2020-12-28 DIAGNOSIS — E559 Vitamin D deficiency, unspecified: Secondary | ICD-10-CM

## 2020-12-28 MED ORDER — BUPROPION HCL ER (SR) 200 MG PO TB12: 200.0000 mg | ORAL_TABLET | Freq: Every day | ORAL | 0 refills | Status: DC

## 2020-12-28 MED ORDER — VITAMIN D (ERGOCALCIFEROL) 1.25 MG (50000 UNIT) PO CAPS: 50000.0000 [IU] | ORAL_CAPSULE | ORAL | 0 refills | Status: DC

## 2020-12-28 MED FILL — BUPROPION HCL SR 200 MG TAB: 200 | 30 days supply | Qty: 30 | Fill #0

## 2020-12-28 MED FILL — VIT D2 1.25 MG (50,000 UNIT: 1.25 MG | 28 days supply | Qty: 4 | Fill #0

## 2020-12-29 NOTE — Progress Notes (Signed)
Chief Complaint:   OBESITY Nancy Gilmore is here to discuss her progress with her obesity treatment plan along with follow-up of her obesity related diagnoses. Nancy Gilmore is on the Category 1 Plan + 100 calories and states she is following her eating plan approximately 30% of the time. Nancy Gilmore states she is doing 0 minutes 0 times per week.  Today's visit was #: 12 Starting weight: 236 lbs Starting date: 04/20/2020 Today's weight: 235 lbs Today's date: 12/28/2020 Total lbs lost to date: 1 lb Total lbs lost since last in-office visit: 0  Interim History: Nancy Gilmore has been "stress eating" more the last several weeks. There is often high sugary snacks at work- cupcakes, cookies.  She is on Wellbutrin SR 150 mg daily. She has been on this dose for months.  Subjective:   1. Vitamin D deficiency Nancy Gilmore's Vitamin D level was 47.2 on 10/13/2020. She is currently taking prescription vitamin D 50,000 IU each week. She denies nausea, vomiting or muscle weakness.   Ref. Range 10/13/2020 08:56  Vitamin D, 25-Hydroxy Latest Ref Range: 30.0 - 100.0 ng/mL 47.2   2. Insulin resistance Pt's insulin level is worsening. It was 21.9 on 10/13/2020. She is not on Metformin.  Lab Results  Component Value Date   INSULIN 21.9 10/13/2020   INSULIN 18.1 04/20/2020   Lab Results  Component Value Date   HGBA1C 5.4 04/20/2020    3. Other disorder of eating Pt's BP and heart rate are stable at OV. She denies history of seizure. She reports increased cravings that last a few weeks. She reports stable mood. Pt denies suicidal or homicidal ideations. she has been on Wellbutrin SR 150 mg daily for months.  4. At risk for diabetes mellitus Nancy Gilmore is at higher than average risk for developing diabetes due to obesity and worsening insulin resistance.  Assessment/Plan:   1. Vitamin D deficiency Low Vitamin D level contributes to fatigue and are associated with obesity, breast, and colon cancer. She agrees to  continue to take prescription Vitamin D @50 ,000 IU every week and will follow-up for routine testing of Vitamin D, at least 2-3 times per year to avoid over-replacement.  - Vitamin D, Ergocalciferol, (DRISDOL) 1.25 MG (50000 UNIT) CAPS capsule; Take 1 capsule (50,000 Units total) by mouth every 7 (seven) days.  Dispense: 4 capsule; Refill: 0  2. Insulin resistance Nancy Gilmore will continue to work on weight loss, exercise, and decreasing simple carbohydrates to help decrease the risk of diabetes. Nancy Gilmore agreed to follow-up with [place] as directed to closely monitor her progress. Increase protein in meals and snacks.  3. Other disorder of eating Behavior modification techniques were discussed today to help Nancy Gilmore deal with her emotional/non-hunger eating behaviors.  Orders and follow up as documented in patient record. Increase Wellbutrin to 200 mg.  - buPROPion (WELLBUTRIN SR) 200 MG 12 hr tablet; Take 1 tablet (200 mg total) by mouth daily.  Dispense: 30 tablet; Refill: 0  4. At risk for diabetes mellitus Nancy Gilmore was given approximately 15 minutes of diabetes education and counseling today. We discussed intensive lifestyle modifications today with an emphasis on weight loss as well as increasing exercise and decreasing simple carbohydrates in her diet. We also reviewed medication options with an emphasis on risk versus benefit of those discussed.   Repetitive spaced learning was employed today to elicit superior memory formation and behavioral change.  5. Class 2 severe obesity with serious comorbidity and body mass index (BMI) of 39.0 to 39.9 in adult, unspecified  obesity type [facility]) Nancy Gilmore is currently in the action stage of change. As such, her goal is to continue with weight loss efforts. She has agreed to the Category 1 Plan + 100 calories.   Exercise goals: No exercise has been prescribed at this time.  Behavioral modification strategies: increasing lean protein intake, decreasing simple  carbohydrates, meal planning and cooking strategies, better snacking choices and planning for success.  Nancy Gilmore has agreed to follow-up with our clinic in 3 weeks. She was informed of the importance of frequent follow-up visits to maximize her success with intensive lifestyle modifications for her multiple health conditions.   Objective:   Blood pressure 131/77, pulse 66, temperature 98.8 F (37.1 C), height 5\' 5"  (1.651 m), weight 235 lb (106.6 kg), SpO2 98 %. Body mass index is 39.11 kg/m.  General: Cooperative, alert, well developed, in no acute distress. HEENT: Conjunctivae and lids unremarkable. Cardiovascular: Regular rhythm.  Lungs: Normal work of breathing. Neurologic: No focal deficits.   Lab Results  Component Value Date   CREATININE 0.76 10/13/2020   BUN 12 10/13/2020   NA 142 10/13/2020   K 4.6 10/13/2020   CL 105 10/13/2020   CO2 25 10/13/2020   Lab Results  Component Value Date   ALT 16 10/13/2020   AST 15 10/13/2020   ALKPHOS 56 10/13/2020   BILITOT 0.4 10/13/2020   Lab Results  Component Value Date   HGBA1C 5.4 04/20/2020   HGBA1C 5.4 01/12/2020   HGBA1C  12/15/2009    5.5 (NOTE) The ADA recommends the following therapeutic goal for glycemic control related to Hgb A1c measurement: Goal of therapy: <6.5 Hgb A1c  Reference: American Diabetes Association: Clinical Practice Recommendations 2010, Diabetes Care, 2010, 33: (Suppl  1).   Lab Results  Component Value Date   INSULIN 21.9 10/13/2020   INSULIN 18.1 04/20/2020   Lab Results  Component Value Date   TSH 1.350 04/20/2020   Lab Results  Component Value Date   CHOL 178 04/20/2020   HDL 87 04/20/2020   LDLCALC 80 04/20/2020   TRIG 57 04/20/2020   CHOLHDL 3.0 01/12/2020   Lab Results  Component Value Date   WBC 6.4 04/20/2020   HGB 12.6 04/20/2020   HCT 39.2 04/20/2020   MCV 92 04/20/2020   PLT 236 04/20/2020    Attestation Statements:   Reviewed by clinician on day of visit:  allergies, medications, problem list, medical history, surgical history, family history, social history, and previous encounter notes.  Coral Ceo, am acting as Location manager for Mina Marble, NP.  I have reviewed the above documentation for accuracy and completeness, and I agree with the above. -  Caris Cerveny d. Sandy Haye, NP-C

## 2021-01-01 DIAGNOSIS — E8881 Metabolic syndrome: Secondary | ICD-10-CM | POA: Insufficient documentation

## 2021-01-01 DIAGNOSIS — F509 Eating disorder, unspecified: Secondary | ICD-10-CM | POA: Insufficient documentation

## 2021-01-01 DIAGNOSIS — Z6841 Body Mass Index (BMI) 40.0 and over, adult: Secondary | ICD-10-CM | POA: Insufficient documentation

## 2021-01-01 DIAGNOSIS — E88819 Insulin resistance, unspecified: Secondary | ICD-10-CM | POA: Insufficient documentation

## 2021-01-01 DIAGNOSIS — E669 Obesity, unspecified: Secondary | ICD-10-CM | POA: Insufficient documentation

## 2021-01-03 ENCOUNTER — Telehealth (INDEPENDENT_AMBULATORY_CARE_PROVIDER_SITE_OTHER): Payer: Self-pay

## 2021-01-19 ENCOUNTER — Other Ambulatory Visit (INDEPENDENT_AMBULATORY_CARE_PROVIDER_SITE_OTHER): Payer: Self-pay | Admitting: Adult Health

## 2021-01-19 ENCOUNTER — Other Ambulatory Visit: Payer: Self-pay

## 2021-01-19 ENCOUNTER — Ambulatory Visit (INDEPENDENT_AMBULATORY_CARE_PROVIDER_SITE_OTHER): Payer: No Typology Code available for payment source | Admitting: Adult Health

## 2021-01-19 ENCOUNTER — Encounter (INDEPENDENT_AMBULATORY_CARE_PROVIDER_SITE_OTHER): Payer: Self-pay | Admitting: Adult Health

## 2021-01-19 VITALS — BP 147/83 | HR 63 | Temp 98.1°F | Ht 65.0 in | Wt 236.0 lb

## 2021-01-19 DIAGNOSIS — E559 Vitamin D deficiency, unspecified: Secondary | ICD-10-CM | POA: Diagnosis not present

## 2021-01-19 DIAGNOSIS — E8881 Metabolic syndrome: Secondary | ICD-10-CM | POA: Diagnosis not present

## 2021-01-19 DIAGNOSIS — Z9189 Other specified personal risk factors, not elsewhere classified: Secondary | ICD-10-CM

## 2021-01-19 DIAGNOSIS — F5089 Other specified eating disorder: Secondary | ICD-10-CM

## 2021-01-19 DIAGNOSIS — Z6841 Body Mass Index (BMI) 40.0 and over, adult: Secondary | ICD-10-CM

## 2021-01-19 MED ORDER — BUPROPION HCL ER (SR) 200 MG PO TB12: 200.0000 mg | ORAL_TABLET | Freq: Every day | ORAL | 0 refills | Status: DC

## 2021-01-19 MED ORDER — VITAMIN D (ERGOCALCIFEROL) 1.25 MG (50000 UNIT) PO CAPS: 50000.0000 [IU] | ORAL_CAPSULE | ORAL | 0 refills | Status: DC

## 2021-01-19 MED FILL — VIT D2 1.25 MG (50,000 UNIT: 1.25 MG | 28 days supply | Qty: 4 | Fill #0

## 2021-01-23 NOTE — Progress Notes (Signed)
Chief Complaint:   OBESITY Nancy Gilmore is here to discuss her progress with her obesity treatment plan along with follow-up of her obesity related diagnoses. Nancy Gilmore is on the Category 1 Plan and states she is following her eating plan approximately 30% of the time. Nancy Gilmore states she is doing 0 minutes 0 times per week.  Today's visit was #: 66 Starting weight: 236 lbs Starting date: 04/20/2020 Today's weight: 236 lbs Today's date: 01/19/2021 Total lbs lost to date: 0 Total lbs lost since last in-office visit: 0  Interim History: Lanny is able to follow breakfast and lunch easily, then dinner is quite a challenge to eat on plan. She has been staying with her mother when she is not working. She feels that she is solely caring for her mother- limited family support. Her brother and sister-in-law will keep mother over nights. Her mother had a CVA Feb 2021 and has residual deficits. She has recently hired a caretaker to assist more with her mother's care.  Subjective:   1. Vitamin D deficiency Nancy Gilmore's Vitamin D level was below goal of 50, at 47.2 on 10/13/2020. She is currently taking prescription vitamin D 50,000 IU each week. She denies nausea, vomiting or muscle weakness.   Ref. Range 10/13/2020 08:56  Vitamin D, 25-Hydroxy Latest Ref Range: 30.0 - 100.0 ng/mL 47.2   2. Other disorder of eating Nancy Gilmore reports decreased cravings with Wellbutrin SR 200 mg QD. She denies history of seizure disorder. She is experiencing increased stress with care of her mother. She is not working with a therapist currently.   3. Insulin resistance Nancy Gilmore's last 2 insulin checks were above goal at 21.9 and 18.1. She is not on Metformin.  Lab Results  Component Value Date   INSULIN 21.9 10/13/2020   INSULIN 18.1 04/20/2020   Lab Results  Component Value Date   HGBA1C 5.4 04/20/2020    4. At risk for diabetes mellitus Nancy Gilmore is at higher than average risk for developing diabetes due to obesity  and insulin resistance.  Assessment/Plan:   1. Vitamin D deficiency Low Vitamin D level contributes to fatigue and are associated with obesity, breast, and colon cancer. She agrees to continue to take prescription Vitamin D @50 ,000 IU every week and will follow-up for routine testing of Vitamin D, at least 2-3 times per year to avoid over-replacement.  - Vitamin D, Ergocalciferol, (DRISDOL) 1.25 MG (50000 UNIT) CAPS capsule; Take 1 capsule (50,000 Units total) by mouth every 7 (seven) days.  Dispense: 4 capsule; Refill: 0  2. Other disorder of eating Establish with EACP therapist through [facility].   - buPROPion (WELLBUTRIN SR) 200 MG 12 hr tablet; Take 1 tablet (200 mg total) by mouth daily.  Dispense: 30 tablet; Refill: 0  3. Insulin resistance Nancy Gilmore will continue to work on weight loss, exercise, and decreasing simple carbohydrates to help decrease the risk of diabetes. Nancy Gilmore agreed to follow-up with [place] as directed to closely monitor her progress.  4. At risk for diabetes mellitus Nancy Gilmore was given approximately 15 minutes of diabetes education and counseling today. We discussed intensive lifestyle modifications today with an emphasis on weight loss as well as increasing exercise and decreasing simple carbohydrates in her diet. We also reviewed medication options with an emphasis on risk versus benefit of those discussed.   Repetitive spaced learning was employed today to elicit superior memory formation and behavioral change.  5. Class 3 severe obesity with serious comorbidity and body mass index (BMI) of 40.0  to 44.9 in adult, unspecified obesity type Nancy Gilmore) Nancy Gilmore is currently in the action stage of change. As such, her goal is to continue with weight loss efforts. She has agreed to the Category 1 Plan and keeping a food journal and adhering to recommended goals of 300-400 calories and 35 g protein with supper. Change lunch and dinner timing or journal for dinner.   Exercise  goals: No exercise has been prescribed at this time.  Behavioral modification strategies: increasing lean protein intake, decreasing simple carbohydrates, meal planning and cooking strategies, better snacking choices and planning for success.  Nancy Gilmore has agreed to follow-up with our clinic in 3 weeks. She was informed of the importance of frequent follow-up visits to maximize her success with intensive lifestyle modifications for her multiple health conditions.   Objective:   Blood pressure (!) 147/83, pulse 63, temperature 98.1 F (36.7 C), height 5\' 5"  (1.651 m), weight 236 lb (107 kg), SpO2 98 %. Body mass index is 39.27 kg/m.  General: Cooperative, alert, well developed, in no acute distress. HEENT: Conjunctivae and lids unremarkable. Cardiovascular: Regular rhythm.  Lungs: Normal work of breathing. Neurologic: No focal deficits.   Lab Results  Component Value Date   CREATININE 0.76 10/13/2020   BUN 12 10/13/2020   NA 142 10/13/2020   K 4.6 10/13/2020   CL 105 10/13/2020   CO2 25 10/13/2020   Lab Results  Component Value Date   ALT 16 10/13/2020   AST 15 10/13/2020   ALKPHOS 56 10/13/2020   BILITOT 0.4 10/13/2020   Lab Results  Component Value Date   HGBA1C 5.4 04/20/2020   HGBA1C 5.4 01/12/2020   HGBA1C  12/15/2009    5.5 (NOTE) The ADA recommends the following therapeutic goal for glycemic control related to Hgb A1c measurement: Goal of therapy: <6.5 Hgb A1c  Reference: American Diabetes Association: Clinical Practice Recommendations 2010, Diabetes Care, 2010, 33: (Suppl  1).   Lab Results  Component Value Date   INSULIN 21.9 10/13/2020   INSULIN 18.1 04/20/2020   Lab Results  Component Value Date   TSH 1.350 04/20/2020   Lab Results  Component Value Date   CHOL 178 04/20/2020   HDL 87 04/20/2020   LDLCALC 80 04/20/2020   TRIG 57 04/20/2020   CHOLHDL 3.0 01/12/2020   Lab Results  Component Value Date   WBC 6.4 04/20/2020   HGB 12.6 04/20/2020    HCT 39.2 04/20/2020   MCV 92 04/20/2020   PLT 236 04/20/2020    Attestation Statements:   Reviewed by clinician on day of visit: allergies, medications, problem list, medical history, surgical history, family history, social history, and previous encounter notes.  Coral Ceo, am acting as Location manager for Mina Marble, NP.  I have reviewed the above documentation for accuracy and completeness, and I agree with the above. -  Katy d. Danford, NP-C

## 2021-01-24 ENCOUNTER — Encounter (INDEPENDENT_AMBULATORY_CARE_PROVIDER_SITE_OTHER): Payer: No Typology Code available for payment source | Admitting: Nurse Practitioner

## 2021-01-30 ENCOUNTER — Encounter (INDEPENDENT_AMBULATORY_CARE_PROVIDER_SITE_OTHER): Payer: Self-pay | Admitting: Nurse Practitioner

## 2021-01-30 ENCOUNTER — Ambulatory Visit (INDEPENDENT_AMBULATORY_CARE_PROVIDER_SITE_OTHER): Payer: No Typology Code available for payment source | Admitting: Nurse Practitioner

## 2021-01-30 ENCOUNTER — Other Ambulatory Visit: Payer: Self-pay

## 2021-01-30 VITALS — BP 128/84 | HR 62 | Temp 97.7°F | Ht 63.5 in | Wt 237.0 lb

## 2021-01-30 DIAGNOSIS — Z0001 Encounter for general adult medical examination with abnormal findings: Secondary | ICD-10-CM

## 2021-01-30 DIAGNOSIS — E559 Vitamin D deficiency, unspecified: Secondary | ICD-10-CM

## 2021-01-30 DIAGNOSIS — Z1211 Encounter for screening for malignant neoplasm of colon: Secondary | ICD-10-CM

## 2021-01-30 DIAGNOSIS — Z13220 Encounter for screening for lipoid disorders: Secondary | ICD-10-CM

## 2021-01-30 DIAGNOSIS — E669 Obesity, unspecified: Secondary | ICD-10-CM | POA: Diagnosis not present

## 2021-01-30 DIAGNOSIS — Z131 Encounter for screening for diabetes mellitus: Secondary | ICD-10-CM

## 2021-01-30 NOTE — Progress Notes (Signed)
Subjective:  Patient ID: Nancy Gilmore, female    DOB: [birth year]  Age: 55 y.o. MRN: [id number]  CC:  Chief Complaint  Patient presents with  . Annual Exam    Doing okay, no concerns      HPI  This patient arrives today for the above.  Past Medical History:  Diagnosis Date  . Cellulitis of leg, right   . Fatty liver   . Fibroids    uterine  . GERD (gastroesophageal reflux disease) 08/11/2019  . Obesity (BMI 30-39.9) 08/11/2019  . Vitamin D deficiency disease 08/11/2019      Family History  Problem Relation Age of Onset  . Breast cancer Maternal Aunt   . Hyperlipidemia Mother   . Hypertension Mother   . Stroke Mother   . Depression Mother   . Obesity Mother   . Cancer Father   . Alcohol abuse Father   . Obesity Father     Social History   Social History Narrative   Single,lives with 2 kids.Security at [employer].   Social History   Tobacco Use  . Smoking status: Former Smoker    Types: Cigarettes    Quit date: 11/12/2006    Years since quitting: 14.2  . Smokeless tobacco: Never Used  Substance Use Topics  . Alcohol use: No     Current Meds  Medication Sig  . buPROPion (WELLBUTRIN SR) 200 MG 12 hr tablet Take 1 tablet (200 mg total) by mouth daily.  Marland Kitchen estradiol (VIVELLE-DOT) 0.1 MG/24HR patch Place 1 patch onto the skin 2 (two) times a week.  . Vitamin D, Ergocalciferol, (DRISDOL) 1.25 MG (50000 UNIT) CAPS capsule Take 1 capsule (50,000 Units total) by mouth every 7 (seven) days.    ROS:  Review of Systems  Constitutional: Negative.   Respiratory: Negative for shortness of breath.   Cardiovascular: Negative for chest pain.  Gastrointestinal: Negative for abdominal pain and blood in stool.     Objective:   Today's Vitals: BP 128/84   Pulse 62   Temp 97.7 F (36.5 C) (Temporal)   Ht 5' 3.5" (1.613 m)   Wt 237 lb (107.5 kg)   SpO2 98%   BMI 41.32 kg/m  Vitals with BMI 01/30/2021 01/19/2021 12/28/2020  Height 5' 3.5" 5' 5" 5' 5"  Weight 237  lbs 236 lbs 235 lbs  BMI 41.32 07.37 10.62  Systolic 694 854 627  Diastolic 84 83 77  Pulse 62 63 66     Physical Exam Vitals reviewed.  Constitutional:      Appearance: Normal appearance.  HENT:     Head: Normocephalic and atraumatic.     Right Ear: Ear canal and external ear normal. There is impacted cerumen.     Left Ear: Ear canal and external ear normal. There is impacted cerumen.  Eyes:     General:        Right eye: No discharge.        Left eye: No discharge.     Extraocular Movements: Extraocular movements intact.     Conjunctiva/sclera: Conjunctivae normal.     Pupils: Pupils are equal, round, and reactive to light.  Neck:     Vascular: No carotid bruit.  Cardiovascular:     Rate and Rhythm: Normal rate and regular rhythm.     Pulses: Normal pulses.     Heart sounds: Normal heart sounds. No murmur heard.   Pulmonary:     Effort: Pulmonary effort is normal.  Breath sounds: Normal breath sounds.  Chest:  Breasts:     Right: No supraclavicular adenopathy.     Left: No supraclavicular adenopathy.      Comments: Breast exam deferred per patient Abdominal:     General: Abdomen is flat. Bowel sounds are normal. There is no distension.     Palpations: Abdomen is soft. There is no mass.     Tenderness: There is no abdominal tenderness.  Musculoskeletal:        General: No tenderness.     Cervical back: Neck supple. No muscular tenderness.     Right lower leg: No edema.     Left lower leg: No edema.  Lymphadenopathy:     Cervical: No cervical adenopathy.     Upper Body:     Right upper body: No supraclavicular adenopathy.     Left upper body: No supraclavicular adenopathy.  Skin:    General: Skin is warm and dry.  Neurological:     General: No focal deficit present.     Mental Status: She is alert and oriented to person, place, and time.     Motor: No weakness.     Gait: Gait normal.  Psychiatric:        Mood and Affect: Mood normal.        Behavior:  Behavior normal.        Judgment: Judgment normal.          Assessment and Plan   1. Encounter for general adult medical examination with abnormal findings   2. Vitamin D deficiency disease   3. Diabetes mellitus screening   4. Obesity (BMI 30-39.9)   5. Screen for colon cancer   6. Screening, lipid      Plan: 1.-6.  Patient is up-to-date with most vaccinations and screenings.  She is agreeable to trying Cologuard for colon cancer screening.  She does not have a personal or family history of colon cancer, Crohn's disease, or ulcerative colitis.  We will collect blood work today for further evaluation of her chronic disease as well as to screen for diabetes and elevated cholesterol.  She was encouraged to consider getting her COVID-19 booster.  She will follow-up as scheduled in approximate 3 months.   Tests ordered Orders Placed This Encounter  Procedures  . CMP with eGFR([facility])  . CBC with Differential/Platelets  . Lipid Panel  . Hemoglobin A1c  . TSH  . Vitamin D, 25-hydroxy      No orders of the defined types were placed in this encounter.   Patient to follow-up in 3 months or sooner as needed.  SARAH E GRAY, NP  

## 2021-01-31 LAB — VITAMIN D 25 HYDROXY (VIT D DEFICIENCY, FRACTURES): Vit D, 25-Hydroxy: 59 ng/mL (ref 30–100)

## 2021-01-31 LAB — CBC WITH DIFFERENTIAL/PLATELET
Absolute Monocytes: 457 cells/uL (ref 200–950)
Basophils Absolute: 50 cells/uL (ref 0–200)
Basophils Relative: 0.9 %
Eosinophils Absolute: 231 cells/uL (ref 15–500)
Eosinophils Relative: 4.2 %
HCT: 39.4 % (ref 35.0–45.0)
Hemoglobin: 13.1 g/dL (ref 11.7–15.5)
Lymphs Abs: 1766 cells/uL (ref 850–3900)
MCH: 30.5 pg (ref 27.0–33.0)
MCHC: 33.2 g/dL (ref 32.0–36.0)
MCV: 91.8 fL (ref 80.0–100.0)
MPV: 10 fL (ref 7.5–12.5)
Monocytes Relative: 8.3 %
Neutro Abs: 2998 cells/uL (ref 1500–7800)
Neutrophils Relative %: 54.5 %
Platelets: 240 10*3/uL (ref 140–400)
RBC: 4.29 10*6/uL (ref 3.80–5.10)
RDW: 12.5 % (ref 11.0–15.0)
Total Lymphocyte: 32.1 %
WBC: 5.5 10*3/uL (ref 3.8–10.8)

## 2021-01-31 LAB — LIPID PANEL
Cholesterol: 167 mg/dL (ref <200)
HDL: 72 mg/dL (ref >=50)
LDL Cholesterol (Calc): 79 mg/dL (calc)
Non-HDL Cholesterol (Calc): 95 mg/dL (calc) (ref <130)
Total CHOL/HDL Ratio: 2.3 (calc) (ref <5.0)
Triglycerides: 75 mg/dL (ref <150)

## 2021-01-31 LAB — TSH: TSH: 1.71 mIU/L

## 2021-01-31 LAB — COMPLETE METABOLIC PANEL WITH GFR
AG Ratio: 1.4 (calc) (ref 1.0–2.5)
ALT: 15 U/L (ref 6–29)
AST: 16 U/L (ref 10–35)
Albumin: 4 g/dL (ref 3.6–5.1)
Alkaline phosphatase (APISO): 45 U/L (ref 37–153)
BUN: 12 mg/dL (ref 7–25)
CO2: 26 mmol/L (ref 20–32)
Calcium: 8.8 mg/dL (ref 8.6–10.4)
Chloride: 106 mmol/L (ref 98–110)
Creat: 0.78 mg/dL (ref 0.50–1.05)
GFR, Est African American: 99 mL/min/{1.73_m2} (ref >=60)
GFR, Est Non African American: 86 mL/min/{1.73_m2} (ref >=60)
Globulin: 2.8 g/dL (calc) (ref 1.9–3.7)
Glucose, Bld: 95 mg/dL (ref 65–99)
Potassium: 4.6 mmol/L (ref 3.5–5.3)
Sodium: 142 mmol/L (ref 135–146)
Total Bilirubin: 0.5 mg/dL (ref 0.2–1.2)
Total Protein: 6.8 g/dL (ref 6.1–8.1)

## 2021-01-31 LAB — HEMOGLOBIN A1C
Hgb A1c MFr Bld: 5.3 % of total Hgb (ref <5.7)
Mean Plasma Glucose: 105 mg/dL
eAG (mmol/L): 5.8 mmol/L

## 2021-02-02 ENCOUNTER — Other Ambulatory Visit (HOSPITAL_BASED_OUTPATIENT_CLINIC_OR_DEPARTMENT_OTHER): Payer: Self-pay

## 2021-02-03 LAB — COLOGUARD: Cologuard: NEGATIVE

## 2021-02-07 ENCOUNTER — Encounter (INDEPENDENT_AMBULATORY_CARE_PROVIDER_SITE_OTHER): Payer: Self-pay | Admitting: Adult Health

## 2021-02-07 ENCOUNTER — Other Ambulatory Visit (INDEPENDENT_AMBULATORY_CARE_PROVIDER_SITE_OTHER): Payer: Self-pay | Admitting: Adult Health

## 2021-02-07 ENCOUNTER — Other Ambulatory Visit: Payer: Self-pay

## 2021-02-07 ENCOUNTER — Ambulatory Visit (INDEPENDENT_AMBULATORY_CARE_PROVIDER_SITE_OTHER): Payer: No Typology Code available for payment source | Admitting: Adult Health

## 2021-02-07 VITALS — BP 120/76 | HR 59 | Temp 97.5°F | Ht 63.0 in | Wt 230.0 lb

## 2021-02-07 DIAGNOSIS — E8881 Metabolic syndrome: Secondary | ICD-10-CM | POA: Diagnosis not present

## 2021-02-07 DIAGNOSIS — F5089 Other specified eating disorder: Secondary | ICD-10-CM

## 2021-02-07 DIAGNOSIS — Z6838 Body mass index (BMI) 38.0-38.9, adult: Secondary | ICD-10-CM

## 2021-02-07 DIAGNOSIS — Z9189 Other specified personal risk factors, not elsewhere classified: Secondary | ICD-10-CM

## 2021-02-07 DIAGNOSIS — E559 Vitamin D deficiency, unspecified: Secondary | ICD-10-CM | POA: Diagnosis not present

## 2021-02-07 MED ORDER — BUPROPION HCL ER (SR) 200 MG PO TB12
200.0000 mg | ORAL_TABLET | Freq: Every day | ORAL | 0 refills | Status: DC
Start: 2021-02-07 — End: 2021-02-07

## 2021-02-08 LAB — INSULIN, RANDOM: INSULIN: 13.9 u[IU]/mL (ref 2.6–24.9)

## 2021-02-08 NOTE — Progress Notes (Signed)
Chief Complaint:   OBESITY Nancy Gilmore is here to discuss her progress with her obesity treatment plan along with follow-up of her obesity related diagnoses. Nancy Gilmore is on the Category 1 Plan and keeping a food journal and adhering to recommended goals of 300-400  calories and 35 g protein and states she is following her eating plan approximately 30% of the time. Nancy Gilmore states she is doing yoga 60 minutes 2 times per week.  Today's visit was #: 14 Starting weight: 236 lbs Starting date: 04/20/2020 Today's weight: 230 lbs Today's date: 02/07/2021 Total lbs lost to date: 6 Total lbs lost since last in-office visit: 6  Interim History: Nancy Gilmore is being more mindful with food choices and decreasing nighttime snacking. She has secured a skilled care giver to care for her mother- several hours a day.  She has stopped engaging in toxic relationships.  Her PCP ran labs 01/30/2021- reviewed with pt today.  Subjective:   1. Vitamin D deficiency Discussed labs with patient today. Nancy Gilmore's 01/30/2021 Vit D level was at goal at 59.   Ref. Range 01/30/2021 08:24  Vitamin D, 25-Hydroxy Latest Ref Range: 30 - 100 ng/mL 59   2. Other disorder of eating Nancy Gilmore's BP/HR are at goal. She reports excellent control of cravings on Wellbutrin SR 200 mg QD. She denies history of seizures.  3. Insulin resistance Discussed labs with patient today. Nancy Gilmore's insulin level was above goal at last 2 checks.   Lab Results  Component Value Date   INSULIN 21.9 10/13/2020   INSULIN 18.1 04/20/2020   Lab Results  Component Value Date   HGBA1C 5.3 01/30/2021    4. At risk for diabetes mellitus Nancy Gilmore is at higher than average risk for developing diabetes due to obesity and insulin resistance.  Assessment/Plan:   1. Vitamin D deficiency Low Vitamin D level contributes to fatigue and are associated with obesity, breast, and colon cancer. She agrees to convert to OTC Vitamin D3 @5 ,000 IU daily and will  follow-up for routine testing of Vitamin D, at least 2-3 times per year to avoid over-replacement.  2. Other disorder of eating Behavior modification techniques were discussed today to help Nancy Gilmore deal with her emotional/non-hunger eating behaviors.  Orders and follow up as documented in patient record.   - buPROPion (WELLBUTRIN SR) 200 MG 12 hr tablet; Take 1 tablet (200 mg total) by mouth daily.  Dispense: 30 tablet; Refill: 0  3. Insulin resistance Nancy Gilmore will continue to work on weight loss, exercise, and decreasing simple carbohydrates to help decrease the risk of diabetes. Nancy Gilmore agreed to follow-up with [place] as directed to closely monitor her progress. Check labs today.  - Insulin, random  4. At risk for diabetes mellitus Nancy Gilmore was given approximately 15 minutes of diabetes education and counseling today. We discussed intensive lifestyle modifications today with an emphasis on weight loss as well as increasing exercise and decreasing simple carbohydrates in her diet. We also reviewed medication options with an emphasis on risk versus benefit of those discussed.   Repetitive spaced learning was employed today to elicit superior memory formation and behavioral change.  5. Class 2 severe obesity with serious comorbidity and body mass index (BMI) of 38.0 to 38.9 in adult, unspecified obesity type [facility]) Nancy Gilmore is currently in the action stage of change. As such, her goal is to continue with weight loss efforts. She has agreed to the Category 1 Plan and keeping a food journal and adhering to recommended goals of 300-400 calories  and 35 g protein with supper.   Exercise goals: As is  Behavioral modification strategies: increasing lean protein intake, meal planning and cooking strategies, better snacking choices and planning for success.  Nancy Gilmore has agreed to follow-up with our clinic in 3 weeks. She was informed of the importance of frequent follow-up visits to maximize her success with  intensive lifestyle modifications for her multiple health conditions.   Nancy Gilmore was informed we would discuss her lab results at her next visit unless there is a critical issue that needs to be addressed sooner. Nancy Gilmore agreed to keep her next visit at the agreed upon time to discuss these results.  Objective:   Blood pressure 120/76, pulse (!) 59, temperature (!) 97.5 F (36.4 C), height 5\' 3"  (1.6 m), weight 230 lb (104.3 kg), SpO2 99 %. Body mass index is 40.74 kg/m.  General: Cooperative, alert, well developed, in no acute distress. HEENT: Conjunctivae and lids unremarkable. Cardiovascular: Regular rhythm.  Lungs: Normal work of breathing. Neurologic: No focal deficits.   Lab Results  Component Value Date   CREATININE 0.78 01/30/2021   BUN 12 01/30/2021   NA 142 01/30/2021   K 4.6 01/30/2021   CL 106 01/30/2021   CO2 26 01/30/2021   Lab Results  Component Value Date   ALT 15 01/30/2021   AST 16 01/30/2021   ALKPHOS 56 10/13/2020   BILITOT 0.5 01/30/2021   Lab Results  Component Value Date   HGBA1C 5.3 01/30/2021   HGBA1C 5.4 04/20/2020   HGBA1C 5.4 01/12/2020   HGBA1C  12/15/2009    5.5 (NOTE) The ADA recommends the following therapeutic goal for glycemic control related to Hgb A1c measurement: Goal of therapy: <6.5 Hgb A1c  Reference: American Diabetes Association: Clinical Practice Recommendations 2010, Diabetes Care, 2010, 33: (Suppl  1).   Lab Results  Component Value Date   INSULIN 21.9 10/13/2020   INSULIN 18.1 04/20/2020   Lab Results  Component Value Date   TSH 1.71 01/30/2021   Lab Results  Component Value Date   CHOL 167 01/30/2021   HDL 72 01/30/2021   LDLCALC 79 01/30/2021   TRIG 75 01/30/2021   CHOLHDL 2.3 01/30/2021   Lab Results  Component Value Date   WBC 5.5 01/30/2021   HGB 13.1 01/30/2021   HCT 39.4 01/30/2021   MCV 91.8 01/30/2021   PLT 240 01/30/2021    Attestation Statements:   Reviewed by clinician on day of visit:  allergies, medications, problem list, medical history, surgical history, family history, social history, and previous encounter notes.   Coral Ceo, am acting as Location manager for Mina Marble, NP.  I have reviewed the above documentation for accuracy and completeness, and I agree with the above. -  Taima Rada d. Cecile Guevara, NP-C

## 2021-02-11 ENCOUNTER — Other Ambulatory Visit (HOSPITAL_COMMUNITY): Payer: Self-pay

## 2021-02-20 ENCOUNTER — Encounter (INDEPENDENT_AMBULATORY_CARE_PROVIDER_SITE_OTHER): Payer: Self-pay

## 2021-02-21 ENCOUNTER — Other Ambulatory Visit (HOSPITAL_COMMUNITY): Payer: Self-pay

## 2021-03-04 ENCOUNTER — Other Ambulatory Visit (INDEPENDENT_AMBULATORY_CARE_PROVIDER_SITE_OTHER): Payer: Self-pay | Admitting: Obstetrics and Gynecology

## 2021-03-06 ENCOUNTER — Other Ambulatory Visit (HOSPITAL_COMMUNITY): Payer: Self-pay

## 2021-03-06 MED FILL — Bupropion HCl Tab ER 12HR 200 MG: ORAL | 30 days supply | Qty: 30 | Fill #0 | Status: AC

## 2021-03-06 MED FILL — Estradiol TD Patch Twice Weekly 0.1 MG/24HR: TRANSDERMAL | 28 days supply | Qty: 8 | Fill #0 | Status: CN

## 2021-03-08 ENCOUNTER — Other Ambulatory Visit (HOSPITAL_COMMUNITY): Payer: Self-pay

## 2021-03-13 ENCOUNTER — Other Ambulatory Visit (HOSPITAL_COMMUNITY): Payer: Self-pay

## 2021-03-15 ENCOUNTER — Other Ambulatory Visit (HOSPITAL_COMMUNITY): Payer: Self-pay

## 2021-03-15 MED FILL — Estradiol TD Patch Twice Weekly 0.1 MG/24HR: TRANSDERMAL | 28 days supply | Qty: 8 | Fill #0 | Status: AC

## 2021-03-16 ENCOUNTER — Encounter (INDEPENDENT_AMBULATORY_CARE_PROVIDER_SITE_OTHER): Payer: Self-pay | Admitting: Adult Health

## 2021-03-16 ENCOUNTER — Other Ambulatory Visit (HOSPITAL_COMMUNITY): Payer: Self-pay

## 2021-03-16 ENCOUNTER — Ambulatory Visit (INDEPENDENT_AMBULATORY_CARE_PROVIDER_SITE_OTHER): Payer: No Typology Code available for payment source | Admitting: Adult Health

## 2021-03-16 ENCOUNTER — Other Ambulatory Visit: Payer: Self-pay

## 2021-03-16 VITALS — BP 145/80 | HR 65 | Temp 97.7°F | Ht 63.0 in | Wt 230.0 lb

## 2021-03-16 DIAGNOSIS — F5089 Other specified eating disorder: Secondary | ICD-10-CM | POA: Diagnosis not present

## 2021-03-16 DIAGNOSIS — E8881 Metabolic syndrome: Secondary | ICD-10-CM

## 2021-03-16 DIAGNOSIS — Z9189 Other specified personal risk factors, not elsewhere classified: Secondary | ICD-10-CM | POA: Diagnosis not present

## 2021-03-16 DIAGNOSIS — Z6841 Body Mass Index (BMI) 40.0 and over, adult: Secondary | ICD-10-CM

## 2021-03-16 MED ORDER — BUPROPION HCL ER (SR) 200 MG PO TB12
200.0000 mg | ORAL_TABLET | Freq: Every day | ORAL | 0 refills | Status: DC
  Filled 2021-03-16 – 2021-04-11 (×2): qty 30, 30d supply, fill #0

## 2021-03-20 NOTE — Progress Notes (Signed)
Chief Complaint:   OBESITY Nancy Gilmore is here to discuss her progress with her obesity treatment plan along with follow-up of her obesity related diagnoses. Nancy Gilmore is on the Category 1 Plan and keeping a food journal and adhering to recommended goals of 300-400 calories and 35 g protein and states she is following her eating plan approximately 25% of the time. Nancy Gilmore states she is doing yoga 60 minutes 2 times per week.  Today's visit was #: 15 Starting weight: 236 lbs Starting date: 04/20/2020 Today's weight: 238 lbs Today's date: 03/16/2021 Total lbs lost to date: 0 Total lbs lost since last in-office visit: 0  Interim History: Nancy Gilmore's granddaughter has been acutely ill, triggering frequent fast food intake. She is not surprised that her weight is up. Her mother's caregiver was out of work for 3 weeks with pneumonia. Son had prom. She will eat on plan easier when working.  Subjective:   1. Insulin resistance Nancy Gilmore's 01/30/2021 A1c was 5.3. Her 02/07/2021 insulin level was 13.9.  Lab Results  Component Value Date   INSULIN 13.9 02/07/2021   INSULIN 21.9 10/13/2020   INSULIN 18.1 04/20/2020   Lab Results  Component Value Date   HGBA1C 5.3 01/30/2021   2. Other disorder of eating Nancy Gilmore reports stable mood. Pt denies suicidal or homicidal ideations. She reports reduced cravings with Bupropion SR 200mg  QD.  She denies hx of seizure. BP/HR stable at OV.  3. At risk for diabetes mellitus Nancy Gilmore is at higher than average risk for developing diabetes due to obesity and insulin resistance.  Assessment/Plan:   1. Insulin resistance Nancy Gilmore will continue to work on weight loss, exercise, and decreasing simple carbohydrates/sugar to help decrease the risk of diabetes. Nancy Gilmore agreed to follow-up with [place] as directed to closely monitor her progress. Decrease eating out! Increase protein and continue yoga.  2. Other disorder of eating Behavior modification techniques were  discussed today to help Nancy Gilmore deal with her emotional/non-hunger eating behaviors.  Orders and follow up as documented in patient record.   - buPROPion (WELLBUTRIN SR) 200 MG 12 hr tablet; Take 1 tablet (200 mg total) by mouth daily.  Dispense: 30 tablet; Refill: 0  3. At risk for diabetes mellitus Nancy Gilmore was given approximately 15 minutes of diabetes education and counseling today. We discussed intensive lifestyle modifications today with an emphasis on weight loss as well as increasing exercise and decreasing simple carbohydrates in her diet. We also reviewed medication options with an emphasis on risk versus benefit of those discussed.   Repetitive spaced learning was employed today to elicit superior memory formation and behavioral change.  4. Class 3 severe obesity with serious comorbidity and body mass index (BMI) of 40.0 to 44.9 in adult, unspecified obesity type [facility])  Nancy Gilmore is currently in the action stage of change. As such, her goal is to continue with weight loss efforts. She has agreed to the Category 1 Plan.   Exercise goals: As is- Yoga!  Behavioral modification strategies: increasing lean protein intake, decreasing eating out!, meal planning and cooking strategies, keeping healthy foods in the home, better snacking choices and planning for success.  Nancy Gilmore has agreed to follow-up with our clinic in 4 weeks. She was informed of the importance of frequent follow-up visits to maximize her success with intensive lifestyle modifications for her multiple health conditions.   Objective:   Blood pressure (!) 145/80, pulse 65, temperature 97.7 F (36.5 C), height 5\' 3"  (1.6 m), weight 230 lb (104.3 kg), SpO2  96 %. Body mass index is 40.74 kg/m.  General: Cooperative, alert, well developed, in no acute distress. HEENT: Conjunctivae and lids unremarkable. Cardiovascular: Regular rhythm.  Lungs: Normal work of breathing. Neurologic: No focal deficits.   Lab Results   Component Value Date   CREATININE 0.78 01/30/2021   BUN 12 01/30/2021   NA 142 01/30/2021   K 4.6 01/30/2021   CL 106 01/30/2021   CO2 26 01/30/2021   Lab Results  Component Value Date   ALT 15 01/30/2021   AST 16 01/30/2021   ALKPHOS 56 10/13/2020   BILITOT 0.5 01/30/2021   Lab Results  Component Value Date   HGBA1C 5.3 01/30/2021   HGBA1C 5.4 04/20/2020   HGBA1C 5.4 01/12/2020   HGBA1C  12/15/2009    5.5 (NOTE) The ADA recommends the following therapeutic goal for glycemic control related to Hgb A1c measurement: Goal of therapy: <6.5 Hgb A1c  Reference: American Diabetes Association: Clinical Practice Recommendations 2010, Diabetes Care, 2010, 33: (Suppl  1).   Lab Results  Component Value Date   INSULIN 13.9 02/07/2021   INSULIN 21.9 10/13/2020   INSULIN 18.1 04/20/2020   Lab Results  Component Value Date   TSH 1.71 01/30/2021   Lab Results  Component Value Date   CHOL 167 01/30/2021   HDL 72 01/30/2021   LDLCALC 79 01/30/2021   TRIG 75 01/30/2021   CHOLHDL 2.3 01/30/2021   Lab Results  Component Value Date   WBC 5.5 01/30/2021   HGB 13.1 01/30/2021   HCT 39.4 01/30/2021   MCV 91.8 01/30/2021   PLT 240 01/30/2021    Attestation Statements:   Reviewed by clinician on day of visit: allergies, medications, problem list, medical history, surgical history, family history, social history, and previous encounter notes.  Coral Ceo, CMA, am acting as transcriptionist for Mina Marble, NP.  I have reviewed the above documentation for accuracy and completeness, and I agree with the above. -  Kyley Laurel d. Flemon Kelty, NP-C

## 2021-03-24 ENCOUNTER — Other Ambulatory Visit (HOSPITAL_COMMUNITY): Payer: Self-pay

## 2021-03-24 MED FILL — Estradiol TD Patch Twice Weekly 0.1 MG/24HR: TRANSDERMAL | 28 days supply | Qty: 8 | Fill #1 | Status: CN

## 2021-04-03 ENCOUNTER — Other Ambulatory Visit (HOSPITAL_COMMUNITY): Payer: Self-pay

## 2021-04-03 MED FILL — Estradiol TD Patch Twice Weekly 0.1 MG/24HR: TRANSDERMAL | 84 days supply | Qty: 24 | Fill #1 | Status: AC

## 2021-04-06 ENCOUNTER — Other Ambulatory Visit (HOSPITAL_COMMUNITY): Payer: Self-pay

## 2021-04-11 ENCOUNTER — Other Ambulatory Visit (HOSPITAL_COMMUNITY): Payer: Self-pay

## 2021-04-12 ENCOUNTER — Other Ambulatory Visit (HOSPITAL_COMMUNITY): Payer: Self-pay

## 2021-04-13 ENCOUNTER — Other Ambulatory Visit: Payer: Self-pay

## 2021-04-13 ENCOUNTER — Ambulatory Visit (INDEPENDENT_AMBULATORY_CARE_PROVIDER_SITE_OTHER): Payer: No Typology Code available for payment source | Admitting: Adult Health

## 2021-04-13 VITALS — BP 138/86 | HR 61 | Temp 97.8°F | Ht 63.0 in | Wt 235.0 lb

## 2021-04-13 DIAGNOSIS — F5089 Other specified eating disorder: Secondary | ICD-10-CM

## 2021-04-13 DIAGNOSIS — F439 Reaction to severe stress, unspecified: Secondary | ICD-10-CM | POA: Diagnosis not present

## 2021-04-13 DIAGNOSIS — E8881 Metabolic syndrome: Secondary | ICD-10-CM

## 2021-04-13 DIAGNOSIS — Z6841 Body Mass Index (BMI) 40.0 and over, adult: Secondary | ICD-10-CM

## 2021-04-18 NOTE — Progress Notes (Signed)
Chief Complaint:   OBESITY Nancy Gilmore is here to discuss her progress with her obesity treatment plan along with follow-up of her obesity related diagnoses. [name] is on the Category 1 Plan and states she is following her eating plan approximately 20% of the time. [name] states she is yoga 60 minutes 2 times per week.  Today's visit was #: 16 Starting weight: 236 lbs Starting date: 04/20/2020 Today's weight: 235 lbs Today's date: 04/13/2021 Total lbs lost to date: 1 Total lbs lost since last in-office visit: 3  Interim History: Nancy Gilmore has been dealing with life stressors: 1. Son will graduate [employer] this weekend.  2. Son has [employer] due soon (which she has been investing a lot of time/effort to assist with project completion). 3. Her brother and sister-in-law will be out of town 4 days- which will require more work for her to care for her mother  Subjective:   1. Insulin resistance Deon's insulin level on 02/07/2021 was 13.9. She is not on any BG lowering agents.  2. Other disorder of eating Nancy Gilmore is on bupropion SR 200 mg QD and tolerating it well. She denies history of seizures.  3. Stress at home Pt denies suicidal or homicidal ideations. Nancy Gilmore has significant family stressors.  Assessment/Plan:   1. Insulin resistance Nancy Gilmore will continue to work on weight loss, exercise, and decreasing simple carbohydrates to help decrease the risk of diabetes. Nancy Gilmore agreed to follow-up with [place] as directed to closely monitor her progress. -Monitor labs -Increase protein intake  2. Other disorder of eating Continue bupropion as directed.  Approach EACP for therapy.  3. Stress at home Approach EACP for therapy- contact information provided to pt.  4. Class 3 severe obesity with serious comorbidity and body mass index (BMI) of 40.0 to 44.9 in adult, unspecified obesity type [facility])  [name] is currently in the action stage of change. As such, her goal is to  continue with weight loss efforts. She has agreed to the Category 1 Plan.   Approach EACP for therapy -contact information provided for pt.  Exercise goals: As is- Add workout APP on phone  Behavioral modification strategies: increasing lean protein intake, decreasing simple carbohydrates, meal planning and cooking strategies, keeping healthy foods in the home and planning for success.  Nancy Gilmore has agreed to follow-up with our clinic in 3 weeks. She was informed of the importance of frequent follow-up visits to maximize her success with intensive lifestyle modifications for her multiple health conditions.   Objective:   Blood pressure 138/86, pulse 61, temperature 97.8 F (36.6 C), height 5\' 3"  (1.6 m), weight 235 lb (106.6 kg), SpO2 99 %. Body mass index is 41.63 kg/m.  General: Cooperative, alert, well developed, in no acute distress. HEENT: Conjunctivae and lids unremarkable. Cardiovascular: Regular rhythm.  Lungs: Normal work of breathing. Neurologic: No focal deficits.   Lab Results  Component Value Date   CREATININE 0.78 01/30/2021   BUN 12 01/30/2021   NA 142 01/30/2021   K 4.6 01/30/2021   CL 106 01/30/2021   CO2 26 01/30/2021   Lab Results  Component Value Date   ALT 15 01/30/2021   AST 16 01/30/2021   ALKPHOS 56 10/13/2020   BILITOT 0.5 01/30/2021   Lab Results  Component Value Date   HGBA1C 5.3 01/30/2021   HGBA1C 5.4 04/20/2020   HGBA1C 5.4 01/12/2020   HGBA1C  12/15/2009    5.5 (NOTE) The ADA recommends the following therapeutic goal for glycemic control related  to Hgb A1c measurement: Goal of therapy: <6.5 Hgb A1c  Reference: American Diabetes Association: Clinical Practice Recommendations 2010, Diabetes Care, 2010, 33: (Suppl  1).   Lab Results  Component Value Date   INSULIN 13.9 02/07/2021   INSULIN 21.9 10/13/2020   INSULIN 18.1 04/20/2020   Lab Results  Component Value Date   TSH 1.71 01/30/2021   Lab Results  Component Value Date    CHOL 167 01/30/2021   HDL 72 01/30/2021   LDLCALC 79 01/30/2021   TRIG 75 01/30/2021   CHOLHDL 2.3 01/30/2021   Lab Results  Component Value Date   WBC 5.5 01/30/2021   HGB 13.1 01/30/2021   HCT 39.4 01/30/2021   MCV 91.8 01/30/2021   PLT 240 01/30/2021   No results found for: IRON, TIBC, FERRITIN   Attestation Statements:   Reviewed by clinician on day of visit: allergies, medications, problem list, medical history, surgical history, family history, social history, and previous encounter notes.  Time spent on visit including pre-visit chart review and post-visit care and charting was 32 minutes.   Coral Ceo, CMA, am acting as transcriptionist for Mina Marble, NP.  I have reviewed the above documentation for accuracy and completeness, and I agree with the above. -  Tyneisha Hegeman d. Kratos Ruscitti ,NP-C

## 2021-04-21 ENCOUNTER — Other Ambulatory Visit (HOSPITAL_COMMUNITY): Payer: Self-pay

## 2021-05-04 ENCOUNTER — Ambulatory Visit (INDEPENDENT_AMBULATORY_CARE_PROVIDER_SITE_OTHER): Payer: No Typology Code available for payment source | Admitting: Internal Medicine

## 2021-05-10 ENCOUNTER — Other Ambulatory Visit (HOSPITAL_COMMUNITY): Payer: Self-pay

## 2021-05-10 ENCOUNTER — Other Ambulatory Visit (INDEPENDENT_AMBULATORY_CARE_PROVIDER_SITE_OTHER): Payer: Self-pay

## 2021-05-10 MED ORDER — ESTRADIOL 0.1 MG/24HR TD PTTW
1.0000 | MEDICATED_PATCH | TRANSDERMAL | 3 refills | Status: DC
  Filled 2021-05-10 – 2021-05-19 (×2): qty 8, 56d supply, fill #0
  Filled 2021-05-19 – 2021-06-12 (×2): qty 8, 28d supply, fill #0
  Filled 2021-07-11 (×2): qty 8, 28d supply, fill #1
  Filled 2021-08-12: qty 8, 28d supply, fill #2
  Filled 2021-09-11: qty 8, 28d supply, fill #3

## 2021-05-11 ENCOUNTER — Other Ambulatory Visit: Payer: Self-pay

## 2021-05-11 ENCOUNTER — Other Ambulatory Visit (HOSPITAL_COMMUNITY): Payer: Self-pay

## 2021-05-11 ENCOUNTER — Encounter (INDEPENDENT_AMBULATORY_CARE_PROVIDER_SITE_OTHER): Payer: Self-pay | Admitting: Adult Health

## 2021-05-11 ENCOUNTER — Ambulatory Visit (INDEPENDENT_AMBULATORY_CARE_PROVIDER_SITE_OTHER): Payer: No Typology Code available for payment source | Admitting: Adult Health

## 2021-05-11 VITALS — BP 124/82 | HR 60 | Temp 98.0°F | Ht 63.0 in | Wt 236.0 lb

## 2021-05-11 DIAGNOSIS — E8881 Metabolic syndrome: Secondary | ICD-10-CM

## 2021-05-11 DIAGNOSIS — Z6841 Body Mass Index (BMI) 40.0 and over, adult: Secondary | ICD-10-CM | POA: Diagnosis not present

## 2021-05-11 DIAGNOSIS — Z9189 Other specified personal risk factors, not elsewhere classified: Secondary | ICD-10-CM | POA: Diagnosis not present

## 2021-05-11 DIAGNOSIS — F5089 Other specified eating disorder: Secondary | ICD-10-CM | POA: Diagnosis not present

## 2021-05-11 MED ORDER — BUPROPION HCL ER (SR) 200 MG PO TB12
200.0000 mg | ORAL_TABLET | Freq: Every day | ORAL | 0 refills | Status: DC
  Filled 2021-05-11 – 2021-05-19 (×2): qty 30, 30d supply, fill #0

## 2021-05-17 NOTE — Progress Notes (Signed)
Chief Complaint:   OBESITY Nancy Gilmore is here to discuss her progress with her obesity treatment plan along with follow-up of her obesity related diagnoses. Nancy Gilmore is on the Category 1 Plan and states she is following her eating plan approximately 25% of the time. Nancy Gilmore states she is doing Yoga 60 minutes 2 times per week.  Today's visit was #: 25 Starting weight: 236 lbs Starting date: 04/20/2020 Today's weight: 236 lbs Today's date: 05/11/2021 Total lbs lost to date: 0 Total lbs lost since last in-office visit: 0  Interim History: Nancy Gilmore is practicing yoga at home with beginner DVD- 60 minutes 2 times a week. Her son recently graduated from [employer]. He also completed his [employer] benches at [employer]. She had a lot of time invested with the [employer]. She will often skip meals due to hectic schedule.  Subjective:   1. Insulin resistance 01/30/2021 BG 95 and A1c 5.3.  02/07/2021 insulin level was elevated at 13.9.  Lab Results  Component Value Date   INSULIN 13.9 02/07/2021   INSULIN 21.9 10/13/2020   INSULIN 18.1 04/20/2020   Lab Results  Component Value Date   HGBA1C 5.3 01/30/2021   2. Other disorder of eating BP/HR excellent at OV today.  04/20/2020 EKG stable. Nancy Gilmore reports cravings are well controlled on bupropion SR 200 mg QD.  3. At risk for deficient intake of food Nancy Gilmore is at risk for deficient intake of food due to skipping meals.  Assessment/Plan:   1. Insulin resistance Nancy Gilmore will continue to work on weight loss, exercise, and decreasing simple carbohydrates to help decrease the risk of diabetes. Nancy Gilmore agreed to follow-up with [place] as directed to closely monitor her progress. -Increase protein.  2. Other disorder of eating Behavior modification techniques were discussed today to help Nancy Gilmore deal with her emotional/non-hunger eating behaviors.  Orders and follow up as documented in patient  record.  -ESTABLISH WITH EACP!!  Refill- buPROPion (WELLBUTRIN SR) 200 MG 12 hr tablet; Take 1 tablet (200 mg total) by mouth daily.  Dispense: 30 tablet; Refill: 0  3. At risk for deficient intake of food Nancy Gilmore was given approximately 15 minutes of drug side effect counseling today.  We discussed side effect possibility and risk versus benefits. Nancy Gilmore agreed to the medication and will contact this office if these side effects are intolerable.  Repetitive spaced learning was employed today to elicit superior memory formation and behavioral change.   4. Class 3 severe obesity with serious comorbidity and body mass index (BMI) of 40.0 to 44.9 in adult, unspecified obesity type [facility])  Nancy Gilmore is currently in the action stage of change. As such, her goal is to continue with weight loss efforts. She has agreed to the Category 1 Plan.   -Check fasting labs at next OV. -Establish with EACP. Pt has contact information.  Exercise goals:  As is  Behavioral modification strategies: increasing lean protein intake, meal planning and cooking strategies, keeping healthy foods in the home, and planning for success.  Nancy Gilmore has agreed to follow-up with our clinic in 4 weeks, fasting. She was informed of the importance of frequent follow-up visits to maximize her success with intensive lifestyle modifications for her multiple health conditions.   Objective:   Blood pressure 124/82, pulse 60, temperature 98 F (36.7 C), height 5\' 3"  (1.6 m), weight 236 lb (107 kg), SpO2 98 %. Body mass index is 41.81 kg/m.  General: Cooperative, alert, well developed, in no  acute distress. HEENT: Conjunctivae and lids unremarkable. Cardiovascular: Regular rhythm.  Lungs: Normal work of breathing. Neurologic: No focal deficits.   Lab Results  Component Value Date   CREATININE 0.78 01/30/2021   BUN 12 01/30/2021   NA 142 01/30/2021   K 4.6 01/30/2021   CL 106 01/30/2021   CO2 26 01/30/2021   Lab Results   Component Value Date   ALT 15 01/30/2021   AST 16 01/30/2021   ALKPHOS 56 10/13/2020   BILITOT 0.5 01/30/2021   Lab Results  Component Value Date   HGBA1C 5.3 01/30/2021   HGBA1C 5.4 04/20/2020   HGBA1C 5.4 01/12/2020   HGBA1C  12/15/2009    5.5 (NOTE) The ADA recommends the following therapeutic goal for glycemic control related to Hgb A1c measurement: Goal of therapy: <6.5 Hgb A1c  Reference: American Diabetes Association: Clinical Practice Recommendations 2010, Diabetes Care, 2010, 33: (Suppl  1).   Lab Results  Component Value Date   INSULIN 13.9 02/07/2021   INSULIN 21.9 10/13/2020   INSULIN 18.1 04/20/2020   Lab Results  Component Value Date   TSH 1.71 01/30/2021   Lab Results  Component Value Date   CHOL 167 01/30/2021   HDL 72 01/30/2021   LDLCALC 79 01/30/2021   TRIG 75 01/30/2021   CHOLHDL 2.3 01/30/2021   Lab Results  Component Value Date   VD25OH 59 01/30/2021   VD25OH 47.2 10/13/2020   VD25OH 37.0 04/20/2020   Lab Results  Component Value Date   WBC 5.5 01/30/2021   HGB 13.1 01/30/2021   HCT 39.4 01/30/2021   MCV 91.8 01/30/2021   PLT 240 01/30/2021   No results found for: IRON, TIBC, FERRITIN   Attestation Statements:   Reviewed by clinician on day of visit: allergies, medications, problem list, medical history, surgical history, family history, social history, and previous encounter notes.  Coral Ceo, CMA, am acting as transcriptionist for Mina Marble, NP.  I have reviewed the above documentation for accuracy and completeness, and I agree with the above. -  Lauralee Waters d. Allina Riches, NP-C

## 2021-05-19 ENCOUNTER — Other Ambulatory Visit (HOSPITAL_COMMUNITY): Payer: Self-pay

## 2021-05-22 ENCOUNTER — Ambulatory Visit (INDEPENDENT_AMBULATORY_CARE_PROVIDER_SITE_OTHER): Payer: Self-pay | Admitting: Internal Medicine

## 2021-05-25 ENCOUNTER — Ambulatory Visit (INDEPENDENT_AMBULATORY_CARE_PROVIDER_SITE_OTHER): Payer: Self-pay | Admitting: Internal Medicine

## 2021-06-08 ENCOUNTER — Other Ambulatory Visit: Payer: Self-pay | Admitting: Obstetrics and Gynecology

## 2021-06-08 ENCOUNTER — Other Ambulatory Visit: Payer: Self-pay

## 2021-06-08 ENCOUNTER — Ambulatory Visit
Admission: RE | Admit: 2021-06-08 | Discharge: 2021-06-08 | Disposition: A | Discharge status: Home / Self Care | Payer: No Typology Code available for payment source | Source: Ambulatory Visit | Attending: Obstetrics and Gynecology | Admitting: Obstetrics and Gynecology

## 2021-06-08 DIAGNOSIS — R928 Other abnormal and inconclusive findings on diagnostic imaging of breast: Secondary | ICD-10-CM

## 2021-06-08 DIAGNOSIS — R921 Mammographic calcification found on diagnostic imaging of breast: Secondary | ICD-10-CM

## 2021-06-12 ENCOUNTER — Other Ambulatory Visit (HOSPITAL_COMMUNITY): Payer: Self-pay

## 2021-06-13 ENCOUNTER — Other Ambulatory Visit (HOSPITAL_COMMUNITY): Payer: Self-pay

## 2021-06-13 ENCOUNTER — Other Ambulatory Visit: Payer: Self-pay

## 2021-06-13 ENCOUNTER — Ambulatory Visit (INDEPENDENT_AMBULATORY_CARE_PROVIDER_SITE_OTHER): Payer: No Typology Code available for payment source | Admitting: Adult Health

## 2021-06-13 ENCOUNTER — Encounter (INDEPENDENT_AMBULATORY_CARE_PROVIDER_SITE_OTHER): Payer: Self-pay | Admitting: Adult Health

## 2021-06-13 VITALS — BP 144/80 | HR 67 | Temp 97.6°F | Ht 63.0 in | Wt 236.0 lb

## 2021-06-13 DIAGNOSIS — E8881 Metabolic syndrome: Secondary | ICD-10-CM

## 2021-06-13 DIAGNOSIS — F5089 Other specified eating disorder: Secondary | ICD-10-CM

## 2021-06-13 DIAGNOSIS — E559 Vitamin D deficiency, unspecified: Secondary | ICD-10-CM | POA: Diagnosis not present

## 2021-06-13 DIAGNOSIS — Z9189 Other specified personal risk factors, not elsewhere classified: Secondary | ICD-10-CM

## 2021-06-13 DIAGNOSIS — Z6841 Body Mass Index (BMI) 40.0 and over, adult: Secondary | ICD-10-CM

## 2021-06-13 DIAGNOSIS — F439 Reaction to severe stress, unspecified: Secondary | ICD-10-CM | POA: Diagnosis not present

## 2021-06-13 MED ORDER — BUPROPION HCL ER (SR) 200 MG PO TB12
200.0000 mg | ORAL_TABLET | Freq: Every day | ORAL | 0 refills | Status: DC
  Filled 2021-06-13: qty 30, 30d supply, fill #0

## 2021-06-14 ENCOUNTER — Ambulatory Visit (INDEPENDENT_AMBULATORY_CARE_PROVIDER_SITE_OTHER): Payer: Self-pay | Admitting: Nurse Practitioner

## 2021-06-14 LAB — COMPREHENSIVE METABOLIC PANEL
ALT: 15 IU/L (ref 0–32)
AST: 18 IU/L (ref 0–40)
Albumin/Globulin Ratio: 1.7 (ref 1.2–2.2)
Albumin: 4.3 g/dL (ref 3.8–4.9)
Alkaline Phosphatase: 66 IU/L (ref 44–121)
BUN/Creatinine Ratio: 14 (ref 9–23)
BUN: 11 mg/dL (ref 6–24)
Bilirubin Total: 0.3 mg/dL (ref 0.0–1.2)
CO2: 24 mmol/L (ref 20–29)
Calcium: 9.2 mg/dL (ref 8.7–10.2)
Chloride: 104 mmol/L (ref 96–106)
Creatinine, Ser: 0.78 mg/dL (ref 0.57–1.00)
Globulin, Total: 2.5 g/dL (ref 1.5–4.5)
Glucose: 89 mg/dL (ref 65–99)
Potassium: 4.4 mmol/L (ref 3.5–5.2)
Sodium: 142 mmol/L (ref 134–144)
Total Protein: 6.8 g/dL (ref 6.0–8.5)
eGFR: 90 mL/min/{1.73_m2} (ref >59)

## 2021-06-14 LAB — VITAMIN D 25 HYDROXY (VIT D DEFICIENCY, FRACTURES): Vit D, 25-Hydroxy: 42.2 ng/mL (ref 30.0–100.0)

## 2021-06-14 LAB — HEMOGLOBIN A1C
Est. average glucose Bld gHb Est-mCnc: 105 mg/dL
Hgb A1c MFr Bld: 5.3 % (ref 4.8–5.6)

## 2021-06-14 LAB — INSULIN, RANDOM: INSULIN: 14.4 u[IU]/mL (ref 2.6–24.9)

## 2021-06-16 ENCOUNTER — Other Ambulatory Visit (HOSPITAL_COMMUNITY): Payer: Self-pay

## 2021-06-19 DIAGNOSIS — F439 Reaction to severe stress, unspecified: Secondary | ICD-10-CM | POA: Insufficient documentation

## 2021-06-19 NOTE — Progress Notes (Signed)
Chief Complaint:   OBESITY Nancy Gilmore is here to discuss her progress with her obesity treatment plan along with follow-up of her obesity related diagnoses. Nancy Gilmore is on the Category 1 Plan and states she is following her eating plan approximately 20% of the time. Nancy Gilmore states she is not currently exercising.  Today's visit was #: 18 Starting weight: 236 lbs Starting date: 04/20/2020 Today's weight: 236 lbs Today's date: 06/13/2021 Total lbs lost to date: 0 Total lbs lost since last in-office visit: 0  Interim History: Nancy Gilmore reports increased right knee pain and left hamstring strain pain.  She was recently exposed to poison oak.  She continues to eat out frequently and feels that she can follow the plan easier when working.  She continues to skip meals- when busy running errands.   Subjective:   1. Insulin resistance Nancy Gilmore's insulin level has fluctuated between 13.9-21.9. She is not on any BG lowering medications.  Lab Results  Component Value Date   INSULIN 14.4 06/13/2021   INSULIN 13.9 02/07/2021   INSULIN 21.9 10/13/2020   INSULIN 18.1 04/20/2020   Lab Results  Component Value Date   HGBA1C 5.3 06/13/2021   2. Other disorder of eating Nancy Gilmore's mood is stable. Pt denies suicidal or homicidal ideations. She is on bupropion SR 200 mg QD. She will occasionally crave sweets.  3. Vitamin D deficiency 06/13/2021 Vit D level was 42.2 - below goal of 50. She is not on ergocalciferol. She converted to OTC vitamin D 5,000 IU each day. She denies nausea, vomiting or muscle weakness.   Lab Results  Component Value Date   VD25OH 42.2 06/13/2021   VD25OH 59 01/30/2021   VD25OH 47.2 10/13/2020   4. Stress at home Nancy Gilmore continues to care for her mother and work full-time.  5. At risk for deficient intake of food Nancy Gilmore is at risk for deficient intake of food due to 20% plan compliance.  Assessment/Plan:   1. Insulin resistance Nancy Gilmore will continue to work on  weight loss, exercise, and decreasing simple carbohydrates to help decrease the risk of diabetes. Nancy Gilmore agreed to follow-up with [place] as directed to closely monitor her progress. Check labs today.  - Comprehensive metabolic panel - Hemoglobin A1c - Insulin, random  2. Other disorder of eating Behavior modification techniques were discussed today to help Nancy Gilmore deal with her emotional/non-hunger eating behaviors.  Orders and follow up as documented in patient record.   Refill- buPROPion (WELLBUTRIN SR) 200 MG 12 hr tablet; Take 1 tablet (200 mg total) by mouth daily.  Dispense: 30 tablet; Refill: 0  3. Vitamin D deficiency Low Vitamin D level contributes to fatigue and are associated with obesity, breast, and colon cancer. She agrees to continue to take OTC Vitamin D 5,000 IU QD and will follow-up for routine testing of Vitamin D, at least 2-3 times per year to avoid over-replacement. Check labs today.  - VITAMIN D 25 Hydroxy (Vit-D Deficiency, Fractures)  4. Stress at home Pt is to schedule in person EACP appointment ASAP.  5. At risk for deficient intake of food Nancy Gilmore was given approximately 15 minutes of deficit intake of food prevention counseling today. Nancy Gilmore is at risk for eating too few calories based on current food recall. She was encouraged to focus on meeting caloric and protein goals according to her recommended meal plan.    6. Class 3 severe obesity with serious comorbidity and body mass index (BMI) of 40.0 to 44.9 in adult, unspecified obesity type (  Nancy Gilmore)  Nancy Gilmore is currently in the action stage of change. As such, her goal is to continue with weight loss efforts. She has agreed to the Category 1 Plan.   Pack snacks and lunch on plan when running errands to avoid fast food. Focus on increasing protein at each meal.  Exercise goals:  As is  Behavioral modification strategies: increasing lean protein intake, decreasing simple carbohydrates, decreasing eating out, meal  planning and cooking strategies, keeping healthy foods in the home, and planning for success.  Nancy Gilmore has agreed to follow-up with our clinic in 3 weeks. She was informed of the importance of frequent follow-up visits to maximize her success with intensive lifestyle modifications for her multiple health conditions.   Objective:   Blood pressure (!) 144/80, pulse 67, temperature 97.6 F (36.4 C), height '5\' 3"'$  (1.6 m), weight 236 lb (107 kg), SpO2 98 %. Body mass index is 41.81 kg/m.  General: Cooperative, alert, well developed, in no acute distress. HEENT: Conjunctivae and lids unremarkable. Cardiovascular: Regular rhythm.  Lungs: Normal work of breathing. Neurologic: No focal deficits.   Lab Results  Component Value Date   CREATININE 0.78 06/13/2021   BUN 11 06/13/2021   NA 142 06/13/2021   K 4.4 06/13/2021   CL 104 06/13/2021   CO2 24 06/13/2021   Lab Results  Component Value Date   ALT 15 06/13/2021   AST 18 06/13/2021   ALKPHOS 66 06/13/2021   BILITOT 0.3 06/13/2021   Lab Results  Component Value Date   HGBA1C 5.3 06/13/2021   HGBA1C 5.3 01/30/2021   HGBA1C 5.4 04/20/2020   HGBA1C 5.4 01/12/2020   HGBA1C  12/15/2009    5.5 (NOTE) The ADA recommends the following therapeutic goal for glycemic control related to Hgb A1c measurement: Goal of therapy: <6.5 Hgb A1c  Reference: American Diabetes Association: Clinical Practice Recommendations 2010, Diabetes Care, 2010, 33: (Suppl  1).   Lab Results  Component Value Date   INSULIN 14.4 06/13/2021   INSULIN 13.9 02/07/2021   INSULIN 21.9 10/13/2020   INSULIN 18.1 04/20/2020   Lab Results  Component Value Date   TSH 1.71 01/30/2021   Lab Results  Component Value Date   CHOL 167 01/30/2021   HDL 72 01/30/2021   LDLCALC 79 01/30/2021   TRIG 75 01/30/2021   CHOLHDL 2.3 01/30/2021   Lab Results  Component Value Date   VD25OH 42.2 06/13/2021   VD25OH 59 01/30/2021   VD25OH 47.2 10/13/2020   Lab Results   Component Value Date   WBC 5.5 01/30/2021   HGB 13.1 01/30/2021   HCT 39.4 01/30/2021   MCV 91.8 01/30/2021   PLT 240 01/30/2021    Attestation Statements:   Reviewed by clinician on day of visit: allergies, medications, problem list, medical history, surgical history, family history, social history, and previous encounter notes.  Coral Ceo, CMA, am acting as transcriptionist for Mina Marble, NP.  I have reviewed the above documentation for accuracy and completeness, and I agree with the above. -  Lulie Hurd d. Channell Quattrone, NP-C

## 2021-06-29 ENCOUNTER — Other Ambulatory Visit (HOSPITAL_COMMUNITY): Payer: Self-pay

## 2021-06-29 MED ORDER — CARESTART COVID-19 HOME TEST VI KIT
PACK | 0 refills | Status: DC
  Filled 2021-06-29: qty 2, 2d supply, fill #0

## 2021-07-11 ENCOUNTER — Ambulatory Visit (INDEPENDENT_AMBULATORY_CARE_PROVIDER_SITE_OTHER): Payer: No Typology Code available for payment source | Admitting: Adult Health

## 2021-07-11 ENCOUNTER — Other Ambulatory Visit (HOSPITAL_COMMUNITY): Payer: Self-pay

## 2021-07-12 ENCOUNTER — Other Ambulatory Visit (HOSPITAL_COMMUNITY): Payer: Self-pay

## 2021-07-19 ENCOUNTER — Other Ambulatory Visit (HOSPITAL_COMMUNITY): Payer: Self-pay

## 2021-07-23 ENCOUNTER — Other Ambulatory Visit (INDEPENDENT_AMBULATORY_CARE_PROVIDER_SITE_OTHER): Payer: Self-pay | Admitting: Adult Health

## 2021-07-23 DIAGNOSIS — F5089 Other specified eating disorder: Secondary | ICD-10-CM

## 2021-07-24 ENCOUNTER — Other Ambulatory Visit (HOSPITAL_COMMUNITY): Payer: Self-pay

## 2021-07-24 NOTE — Telephone Encounter (Signed)
Last seen [facility] 06/13/21

## 2021-08-14 ENCOUNTER — Other Ambulatory Visit (HOSPITAL_COMMUNITY): Payer: Self-pay

## 2021-08-16 ENCOUNTER — Other Ambulatory Visit (HOSPITAL_COMMUNITY): Payer: Self-pay

## 2021-09-11 ENCOUNTER — Other Ambulatory Visit (HOSPITAL_COMMUNITY): Payer: Self-pay

## 2021-10-11 ENCOUNTER — Other Ambulatory Visit (HOSPITAL_COMMUNITY): Payer: Self-pay

## 2021-10-11 MED ORDER — ESTRADIOL 0.1 MG/24HR TD PTTW
MEDICATED_PATCH | TRANSDERMAL | 3 refills | Status: DC
  Filled 2021-10-11: qty 24, 84d supply, fill #0
  Filled 2022-01-02: qty 8, 28d supply, fill #1

## 2021-10-15 ENCOUNTER — Telehealth: Payer: No Typology Code available for payment source | Admitting: Family

## 2021-10-15 DIAGNOSIS — R112 Nausea with vomiting, unspecified: Secondary | ICD-10-CM

## 2021-10-15 MED ORDER — ONDANSETRON HCL 4 MG PO TABS: 4.0000 mg | ORAL_TABLET | Freq: Three times a day (TID) | ORAL | 0 refills | Status: DC | PRN

## 2021-10-15 NOTE — Progress Notes (Signed)
E-Visit for Vomiting  We are sorry that you are not feeling well. Here is how we plan to help!  Based on what you have shared with me it looks like you have a Virus that is irritating your GI tract.  Vomiting is the forceful emptying of a portion of the stomach's content through the mouth.  Although nausea and vomiting can make you feel miserable, it's important to remember that these are not diseases, but rather symptoms of an underlying illness.  When we treat short term symptoms, we always caution that any symptoms that persist should be fully evaluated in a medical office.  I have prescribed a medication that will help alleviate your symptoms and allow you to stay hydrated:  Zofran 4 mg 1 tablet every 8 hours as needed for nausea and vomiting  HOME CARE: Drink clear liquids.  This is very important! Dehydration (the lack of fluid) can lead to a serious complication.  Start off with 1 tablespoon every 5 minutes for 8 hours. You may begin eating bland foods after 8 hours without vomiting.  Start with saltine crackers, white bread, rice, mashed potatoes, applesauce. After 48 hours on a bland diet, you may resume a normal diet. Try to go to sleep.  Sleep often empties the stomach and relieves the need to vomit.  GET HELP RIGHT AWAY IF:  Your symptoms do not improve or worsen within 2 days after treatment. You have a fever for over 3 days. You cannot keep down fluids after trying the medication.  MAKE SURE YOU:  Understand these instructions. Will watch your condition. Will get help right away if you are not doing well or get worse.   Thank you for choosing an e-visit.  Your e-visit answers were reviewed by a board certified advanced clinical practitioner to complete your personal care plan. Depending upon the condition, your plan could have included both over the counter or prescription medications.  Please review your pharmacy choice. Make sure the pharmacy is open so you can pick  up prescription now. If there is a problem, you may contact your provider through MyChart messaging and have the prescription routed to another pharmacy.  Your safety is important to us. If you have drug allergies check your prescription carefully.   For the next 24 hours you can use MyChart to ask questions about today's visit, request a non-urgent call back, or ask for a work or school excuse. You will get an email in the next two days asking about your experience. I hope that your e-visit has been valuable and will speed your recovery.   Approximately 5 minutes was spent documenting and reviewing patient's chart.    

## 2021-10-16 ENCOUNTER — Telehealth: Payer: No Typology Code available for payment source | Admitting: Physician Assistant

## 2021-10-16 ENCOUNTER — Encounter (HOSPITAL_BASED_OUTPATIENT_CLINIC_OR_DEPARTMENT_OTHER): Payer: Self-pay | Admitting: *Deleted

## 2021-10-16 ENCOUNTER — Emergency Department (HOSPITAL_BASED_OUTPATIENT_CLINIC_OR_DEPARTMENT_OTHER): Payer: No Typology Code available for payment source

## 2021-10-16 ENCOUNTER — Ambulatory Visit (HOSPITAL_BASED_OUTPATIENT_CLINIC_OR_DEPARTMENT_OTHER)
Admission: EM | Admit: 2021-10-16 | Discharge: 2021-10-17 | Disposition: A | Discharge status: Home / Self Care | Payer: No Typology Code available for payment source | Attending: Emergency Medicine | Admitting: Emergency Medicine

## 2021-10-16 ENCOUNTER — Ambulatory Visit: Admit: 2021-10-16 | Discharge status: Absent without leave | Payer: No Typology Code available for payment source

## 2021-10-16 ENCOUNTER — Other Ambulatory Visit: Payer: Self-pay

## 2021-10-16 DIAGNOSIS — R112 Nausea with vomiting, unspecified: Secondary | ICD-10-CM

## 2021-10-16 DIAGNOSIS — B9681 Helicobacter pylori [H. pylori] as the cause of diseases classified elsewhere: Secondary | ICD-10-CM | POA: Insufficient documentation

## 2021-10-16 DIAGNOSIS — Z6841 Body Mass Index (BMI) 40.0 and over, adult: Secondary | ICD-10-CM | POA: Insufficient documentation

## 2021-10-16 DIAGNOSIS — X58XXXA Exposure to other specified factors, initial encounter: Secondary | ICD-10-CM | POA: Diagnosis not present

## 2021-10-16 DIAGNOSIS — Z87891 Personal history of nicotine dependence: Secondary | ICD-10-CM | POA: Insufficient documentation

## 2021-10-16 DIAGNOSIS — T18128A Food in esophagus causing other injury, initial encounter: Secondary | ICD-10-CM | POA: Insufficient documentation

## 2021-10-16 DIAGNOSIS — K219 Gastro-esophageal reflux disease without esophagitis: Secondary | ICD-10-CM

## 2021-10-16 DIAGNOSIS — R1310 Dysphagia, unspecified: Secondary | ICD-10-CM | POA: Insufficient documentation

## 2021-10-16 DIAGNOSIS — K2289 Other specified disease of esophagus: Secondary | ICD-10-CM | POA: Diagnosis not present

## 2021-10-16 DIAGNOSIS — K2900 Acute gastritis without bleeding: Secondary | ICD-10-CM | POA: Diagnosis not present

## 2021-10-16 DIAGNOSIS — K2100 Gastro-esophageal reflux disease with esophagitis, without bleeding: Secondary | ICD-10-CM | POA: Diagnosis not present

## 2021-10-16 DIAGNOSIS — K3189 Other diseases of stomach and duodenum: Secondary | ICD-10-CM | POA: Insufficient documentation

## 2021-10-16 DIAGNOSIS — Z20822 Contact with and (suspected) exposure to covid-19: Secondary | ICD-10-CM | POA: Insufficient documentation

## 2021-10-16 DIAGNOSIS — K760 Fatty (change of) liver, not elsewhere classified: Secondary | ICD-10-CM | POA: Diagnosis not present

## 2021-10-16 HISTORY — DX: Other complications of anesthesia, initial encounter: T88.59XA

## 2021-10-16 LAB — COMPREHENSIVE METABOLIC PANEL
ALT: 20 U/L (ref 0–44)
AST: 24 U/L (ref 15–41)
Albumin: 4.8 g/dL (ref 3.5–5.0)
Alkaline Phosphatase: 46 U/L (ref 38–126)
Anion gap: 12 (ref 5–15)
BUN: 18 mg/dL (ref 6–20)
CO2: 23 mmol/L (ref 22–32)
Calcium: 9.4 mg/dL (ref 8.9–10.3)
Chloride: 107 mmol/L (ref 98–111)
Creatinine, Ser: 0.81 mg/dL (ref 0.44–1.00)
GFR, Estimated: >60 mL/min (ref >60)
Glucose, Bld: 138 mg/dL — ABNORMAL HIGH (ref 70–99)
Potassium: 3.6 mmol/L (ref 3.5–5.1)
Sodium: 142 mmol/L (ref 135–145)
Total Bilirubin: 0.9 mg/dL (ref 0.3–1.2)
Total Protein: 8.5 g/dL — ABNORMAL HIGH (ref 6.5–8.1)

## 2021-10-16 LAB — CBC WITH DIFFERENTIAL/PLATELET
Abs Immature Granulocytes: 0.06 10*3/uL (ref 0.00–0.07)
Basophils Absolute: 0.1 10*3/uL (ref 0.0–0.1)
Basophils Relative: 1 %
Eosinophils Absolute: 0 10*3/uL (ref 0.0–0.5)
Eosinophils Relative: 0 %
HCT: 43.9 % (ref 36.0–46.0)
Hemoglobin: 15.2 g/dL — ABNORMAL HIGH (ref 12.0–15.0)
Immature Granulocytes: 0 %
Lymphocytes Relative: 15 %
Lymphs Abs: 2.1 10*3/uL (ref 0.7–4.0)
MCH: 31.2 pg (ref 26.0–34.0)
MCHC: 34.6 g/dL (ref 30.0–36.0)
MCV: 90.1 fL (ref 80.0–100.0)
Monocytes Absolute: 1 10*3/uL (ref 0.1–1.0)
Monocytes Relative: 7 %
Neutro Abs: 10.8 10*3/uL — ABNORMAL HIGH (ref 1.7–7.7)
Neutrophils Relative %: 77 %
Platelets: 305 10*3/uL (ref 150–400)
RBC: 4.87 MIL/uL (ref 3.87–5.11)
RDW: 12.4 % (ref 11.5–15.5)
WBC: 14.1 10*3/uL — ABNORMAL HIGH (ref 4.0–10.5)
nRBC: 0 % (ref 0.0–0.2)

## 2021-10-16 LAB — LIPASE, BLOOD: Lipase: 26 U/L (ref 11–51)

## 2021-10-16 MED ORDER — GLUCAGON HCL RDNA (DIAGNOSTIC) 1 MG IJ SOLR
1.0000 mg | Freq: Once | INTRAMUSCULAR | Status: AC
  Administered 2021-10-16: 1 mg via INTRAVENOUS
  Filled 2021-10-16: qty 1

## 2021-10-16 MED ORDER — ONDANSETRON 4 MG PO TBDP: 4.0000 mg | ORAL_TABLET | Freq: Three times a day (TID) | ORAL | 0 refills | Status: DC | PRN

## 2021-10-16 MED ORDER — SODIUM CHLORIDE 0.9 % IV BOLUS
1000.0000 mL | Freq: Once | INTRAVENOUS | Status: AC
  Administered 2021-10-16: 1000 mL via INTRAVENOUS

## 2021-10-16 MED ORDER — HYDROMORPHONE HCL 1 MG/ML IJ SOLN
0.5000 mg | Freq: Once | INTRAMUSCULAR | Status: AC
  Administered 2021-10-16: 0.5 mg via INTRAVENOUS
  Filled 2021-10-16: qty 1

## 2021-10-16 MED ORDER — IOHEXOL 300 MG/ML  SOLN
100.0000 mL | Freq: Once | INTRAMUSCULAR | Status: AC | PRN
  Administered 2021-10-16: 100 mL via INTRAVENOUS

## 2021-10-16 MED ORDER — OMEPRAZOLE 40 MG PO CPDR: 40.0000 mg | DELAYED_RELEASE_CAPSULE | Freq: Every day | ORAL | 0 refills | Status: DC

## 2021-10-16 MED ORDER — ONDANSETRON HCL 4 MG/2ML IJ SOLN
4.0000 mg | Freq: Once | INTRAMUSCULAR | Status: AC
  Administered 2021-10-16: 4 mg via INTRAVENOUS
  Filled 2021-10-16: qty 2

## 2021-10-16 NOTE — ED Notes (Signed)
Patient transported to CT 

## 2021-10-16 NOTE — Progress Notes (Signed)
Virtual Visit Consent   Nancy Gilmore, you are scheduled for a virtual visit with a [facility] provider today.     Just as with appointments in the office, your consent must be obtained to participate.  Your consent will be active for this visit and any virtual visit you may have with one of our providers in the next 365 days.     If you have a MyChart account, a copy of this consent can be sent to you electronically.  All virtual visits are billed to your insurance company just like a traditional visit in the office.    As this is a virtual visit, video technology does not allow for your provider to perform a traditional examination.  This may limit your provider's ability to fully assess your condition.  If your provider identifies any concerns that need to be evaluated in person or the need to arrange testing (such as labs, EKG, etc.), we will make arrangements to do so.     Although advances in technology are sophisticated, we cannot ensure that it will always work on either your end or our end.  If the connection with a video visit is poor, the visit may have to be switched to a telephone visit.  With either a video or telephone visit, we are not always able to ensure that we have a secure connection.     I need to obtain your verbal consent now.   Are you willing to proceed with your visit today?    PAISLEE SZATKOWSKI has provided verbal consent on 10/16/2021 for a virtual visit (video or telephone).   Mar Daring, PA-C   Date: 10/16/2021 2:43 PM   Virtual Visit via Video Note   I, Mar Daring, connected with  Nancy Gilmore  ([id number], 02/03/1966) on 10/16/21 at  1:45 PM EST by a video-enabled telemedicine application and verified that I am speaking with the correct person using two identifiers.  Location: Patient: Virtual Visit Location Patient: Home Provider: Virtual Visit Location Provider: Home Office   I discussed the limitations of evaluation and management  by telemedicine and the availability of in person appointments. The patient expressed understanding and agreed to proceed.    History of Present Illness: Nancy Gilmore is a 55 y.o. who identifies as a female who was assigned female at birth, and is being seen today for emesis.  HPI: Emesis  This is a new problem. The current episode started in the past 7 days. Episode frequency: with every meal. The problem has been unchanged. The emesis has an appearance of bile and stomach contents. There has been no fever. Associated symptoms include chest pain (chest tightness with food intake before vomiting), coughing (reflux type cough) and URI (had some congestion and drainage last night). Pertinent negatives include no chills, diarrhea, fever, headaches, myalgias, sweats or weight loss. She has tried increased fluids and bed rest (zofran tablet (vomited right back up)) for the symptoms. The treatment provided no relief.   Noticing any food intake she is unable to keep down. Even pills she cannot keep down. Feels significant reflux, chest tightness with food intake then vomits back up after taking in. Reports lots of associated bile as well.   Problems:  Patient Active Problem List   Diagnosis Date Noted   Stress at home 06/19/2021   Insulin resistance 01/01/2021   Eating disorder 01/01/2021   Class 3 severe obesity with serious comorbidity and body mass index (BMI) of  40.0 to 44.9 in adult [facility]) 01/01/2021   Rash 01/12/2020   Vitamin D deficiency 08/11/2019   Obesity (BMI 30-39.9) 08/11/2019   GERD (gastroesophageal reflux disease) 08/11/2019    Allergies:  Allergies  Allergen Reactions   Morphine     Other reaction(s): trouble breathing   Naproxen Hives   Medications:  Current Outpatient Medications:    omeprazole (PRILOSEC) 40 MG capsule, Take 1 capsule (40 mg total) by mouth daily., Disp: 30 capsule, Rfl: 0   ondansetron (ZOFRAN-ODT) 4 MG disintegrating tablet, Take 1 tablet (4 mg total)  by mouth every 8 (eight) hours as needed for nausea or vomiting., Disp: 20 tablet, Rfl: 0   buPROPion (WELLBUTRIN SR) 200 MG 12 hr tablet, Take 1 tablet (200 mg total) by mouth daily., Disp: 30 tablet, Rfl: 0   COVID-19 At Home Antigen Test (CARESTART COVID-19 HOME TEST) KIT, Use as directed, Disp: 2 each, Rfl: 0   estradiol (VIVELLE-DOT) 0.1 MG/24HR patch, APPLY 1 PATCH TWICE WEEKLY AS DIRECTED., Disp: 8 patch, Rfl: 12   estradiol (VIVELLE-DOT) 0.1 MG/24HR patch, APPLY 1 PATCH TWICE WEEKLY AS DIRECTED., Disp: 8 patch, Rfl: 3   ondansetron (ZOFRAN) 4 MG tablet, Take 1 tablet (4 mg total) by mouth every 8 (eight) hours as needed for nausea or vomiting., Disp: 20 tablet, Rfl: 0  Observations/Objective: Patient is well-developed, well-nourished in no acute distress.  Resting comfortably  at home.  Head is normocephalic, atraumatic.  No labored breathing.  Speech is clear and coherent with logical content.  Patient is alert and oriented at baseline.    Assessment and Plan: 1. Gastroesophageal reflux disease without esophagitis - omeprazole (PRILOSEC) 40 MG capsule; Take 1 capsule (40 mg total) by mouth daily.  Dispense: 30 capsule; Refill: 0  2. Nausea and vomiting, unspecified vomiting type - ondansetron (ZOFRAN-ODT) 4 MG disintegrating tablet; Take 1 tablet (4 mg total) by mouth every 8 (eight) hours as needed for nausea or vomiting.  Dispense: 20 tablet; Refill: 0  - Symptoms seem very consistent with a hiatal hernia - Will give Zofran for nausea and vomiting - Omeprazole for reflux symptoms - Advised of small bites and small, more frequent meals - Seek emergent care if continues to be unable to keep foods down - If these medications help, still should follow up with PCP or general surgery for consideration of hiatal hernia work up  Follow Up Instructions: I discussed the assessment and treatment plan with the patient. The patient was provided an opportunity to ask questions and all  were answered. The patient agreed with the plan and demonstrated an understanding of the instructions.  A copy of instructions were sent to the patient via MyChart unless otherwise noted below.    The patient was advised to call back or seek an in-person evaluation if the symptoms worsen or if the condition fails to improve as anticipated.  Time:  I spent 13 minutes with the patient via telehealth technology discussing the above problems/concerns.    Mar Daring, PA-C

## 2021-10-16 NOTE — ED Provider Notes (Addendum)
[facility] EMERGENCY DEPARTMENT Provider Note   CSN: [id number] Arrival date & time: 10/16/21  1842     History Chief Complaint  Patient presents with   Vomiting    Nancy Gilmore is a 55 y.o. female.  Patient c/o nausea and vomiting in the past 1-2 days. Symptoms acute onset, moderate, recurrent, persistent, several episodes. Emesis is not bloody or bilious. Is having normal bms, no constipation. No abd distension. Diffuse/generalized abd cramping, no focal pain. Denies hx same or hx chronic gi symptoms. No dysuria or gu c/o. No back or flank pain. No fever or chills. No chest pain or discomfort. No sob. No cough or uri symptoms. Remote hx tah/bso. Denies hx sbo. No recent known ill contacts or bad food ingestion.   The history is provided by the patient, a relative and medical records.      Past Medical History:  Diagnosis Date   Cellulitis of leg, right    Fatty liver    Fibroids    uterine   GERD (gastroesophageal reflux disease) 08/11/2019   Obesity (BMI 30-39.9) 08/11/2019   Vitamin D deficiency disease 08/11/2019    Patient Active Problem List   Diagnosis Date Noted   Stress at home 06/19/2021   Insulin resistance 01/01/2021   Eating disorder 01/01/2021   Class 3 severe obesity with serious comorbidity and body mass index (BMI) of 40.0 to 44.9 in adult ([place]) 01/01/2021   Rash 01/12/2020   Vitamin D deficiency 08/11/2019   Obesity (BMI 30-39.9) 08/11/2019   GERD (gastroesophageal reflux disease) 08/11/2019    Past Surgical History:  Procedure Laterality Date   ABDOMINAL HYSTERECTOMY     TAH&BSO Age 52 yrs   CESAREAN SECTION     NEUROPLASTY / TRANSPOSITION ULNAR NERVE AT ELBOW Bilateral      OB History     Gravida  2   Para  2   Term      Preterm      AB      Living         SAB      IAB      Ectopic      Multiple      Live Births              Family History  Problem Relation Age of Onset   Breast cancer Maternal Aunt     Hyperlipidemia Mother    Hypertension Mother    Stroke Mother    Depression Mother    Obesity Mother    Cancer Father    Alcohol abuse Father    Obesity Father     Social History   Tobacco Use   Smoking status: Former    Types: Cigarettes    Quit date: 11/12/2006    Years since quitting: 14.9   Smokeless tobacco: Never  Vaping Use   Vaping Use: Never used  Substance Use Topics   Alcohol use: No   Drug use: No    Home Medications Prior to Admission medications   Medication Sig Start Date End Date Taking? Authorizing Provider  buPROPion (WELLBUTRIN SR) 200 MG 12 hr tablet Take 1 tablet (200 mg total) by mouth daily. 06/13/21   Esaw Grandchild, NP  COVID-19 At Home Antigen Test [facility] COVID-19 HOME TEST) KIT Use as directed 06/29/21   Jefm Bryant, RPH  estradiol (VIVELLE-DOT) 0.1 MG/24HR patch APPLY 1 PATCH TWICE WEEKLY AS DIRECTED. 12/09/20 12/09/21  Louretta Shorten, MD  estradiol (VIVELLE-DOT) 0.1  MG/24HR patch APPLY 1 PATCH TWICE WEEKLY AS DIRECTED. 10/11/21     omeprazole (PRILOSEC) 40 MG capsule Take 1 capsule (40 mg total) by mouth daily. 10/16/21   Mar Daring, PA-C  ondansetron (ZOFRAN) 4 MG tablet Take 1 tablet (4 mg total) by mouth every 8 (eight) hours as needed for nausea or vomiting. 10/15/21   Evelina Dun A, FNP  ondansetron (ZOFRAN-ODT) 4 MG disintegrating tablet Take 1 tablet (4 mg total) by mouth every 8 (eight) hours as needed for nausea or vomiting. 10/16/21   Mar Daring, PA-C    Allergies    Morphine and Naproxen  Review of Systems   Review of Systems  Constitutional:  Negative for fever.  HENT:  Negative for sore throat.   Eyes:  Negative for redness.  Respiratory:  Negative for cough and shortness of breath.   Cardiovascular:  Negative for chest pain.  Gastrointestinal:  Positive for vomiting. Negative for constipation and diarrhea.  Genitourinary:  Negative for dysuria, flank pain, vaginal bleeding and vaginal discharge.   Musculoskeletal:  Negative for back pain and neck pain.  Skin:  Negative for rash.  Neurological:  Negative for headaches.  Hematological:  Does not bruise/bleed easily.  Psychiatric/Behavioral:  Negative for confusion.    Physical Exam Updated Vital Signs BP 121/66   Pulse 72   Temp 98.2 F (36.8 C) (Oral)   Resp 18   Ht 1.651 m ('5\' 5"' )   Wt 113.4 kg   SpO2 99%   BMI 41.60 kg/m   Physical Exam Vitals and nursing note reviewed.  Constitutional:      Appearance: Normal appearance. She is well-developed.  HENT:     Head: Atraumatic.     Nose: Nose normal.     Mouth/Throat:     Mouth: Mucous membranes are moist.  Eyes:     General: No scleral icterus.    Conjunctiva/sclera: Conjunctivae normal.  Neck:     Trachea: No tracheal deviation.  Cardiovascular:     Rate and Rhythm: Normal rate and regular rhythm.     Pulses: Normal pulses.     Heart sounds: Normal heart sounds. No murmur heard.   No friction rub. No gallop.  Pulmonary:     Effort: Pulmonary effort is normal. No respiratory distress.     Breath sounds: Normal breath sounds.  Abdominal:     General: Bowel sounds are normal. There is no distension.     Palpations: Abdomen is soft.     Tenderness: There is abdominal tenderness. There is no guarding.     Comments: Mid abd tenderness, no peritonitis.   Genitourinary:    Comments: No cva tenderness.  Musculoskeletal:        General: No swelling or tenderness.     Cervical back: Normal range of motion and neck supple. No rigidity. No muscular tenderness.  Skin:    General: Skin is warm and dry.     Findings: No rash.  Neurological:     Mental Status: She is alert.     Comments: Alert, speech normal.   Psychiatric:        Mood and Affect: Mood normal.    ED Results / Procedures / Treatments   Labs (all labs ordered are listed, but only abnormal results are displayed) Results for orders placed or performed during the hospital encounter of 10/16/21   Comprehensive metabolic panel  Result Value Ref Range   Sodium 142 135 - 145 mmol/L   Potassium 3.6 3.5 -  5.1 mmol/L   Chloride 107 98 - 111 mmol/L   CO2 23 22 - 32 mmol/L   Glucose, Bld 138 (H) 70 - 99 mg/dL   BUN 18 6 - 20 mg/dL   Creatinine, Ser 0.81 0.44 - 1.00 mg/dL   Calcium 9.4 8.9 - 10.3 mg/dL   Total Protein 8.5 (H) 6.5 - 8.1 g/dL   Albumin 4.8 3.5 - 5.0 g/dL   AST 24 15 - 41 U/L   ALT 20 0 - 44 U/L   Alkaline Phosphatase 46 38 - 126 U/L   Total Bilirubin 0.9 0.3 - 1.2 mg/dL   GFR, Estimated >60 >60 mL/min   Anion gap 12 5 - 15  Lipase, blood  Result Value Ref Range   Lipase 26 11 - 51 U/L  CBC with Diff  Result Value Ref Range   WBC 14.1 (H) 4.0 - 10.5 K/uL   RBC 4.87 3.87 - 5.11 MIL/uL   Hemoglobin 15.2 (H) 12.0 - 15.0 g/dL   HCT 43.9 36.0 - 46.0 %   MCV 90.1 80.0 - 100.0 fL   MCH 31.2 26.0 - 34.0 pg   MCHC 34.6 30.0 - 36.0 g/dL   RDW 12.4 11.5 - 15.5 %   Platelets 305 150 - 400 K/uL   nRBC 0.0 0.0 - 0.2 %   Neutrophils Relative % 77 %   Neutro Abs 10.8 (H) 1.7 - 7.7 K/uL   Lymphocytes Relative 15 %   Lymphs Abs 2.1 0.7 - 4.0 K/uL   Monocytes Relative 7 %   Monocytes Absolute 1.0 0.1 - 1.0 K/uL   Eosinophils Relative 0 %   Eosinophils Absolute 0.0 0.0 - 0.5 K/uL   Basophils Relative 1 %   Basophils Absolute 0.1 0.0 - 0.1 K/uL   Immature Granulocytes 0 %   Abs Immature Granulocytes 0.06 0.00 - 0.07 K/uL     EKG None  Radiology CT Abdomen Pelvis W Contrast  Result Date: 10/16/2021 CLINICAL DATA:  Abdominal pain. EXAM: CT ABDOMEN AND PELVIS WITH CONTRAST TECHNIQUE: Multidetector CT imaging of the abdomen and pelvis was performed using the standard protocol following bolus administration of intravenous contrast. CONTRAST:  163m OMNIPAQUE IOHEXOL 300 MG/ML  SOLN COMPARISON:  None. FINDINGS: Lower chest: The visualized lung bases are clear. No intra-abdominal free air or free fluid. Hepatobiliary: Fatty liver. No intrahepatic biliary dilatation. The  gallbladder is unremarkable. Pancreas: Unremarkable. No pancreatic ductal dilatation or surrounding inflammatory changes. Spleen: Normal in size without focal abnormality. Adrenals/Urinary Tract: The adrenal glands unremarkable. There is no hydronephrosis on either side. There is symmetric enhancement and excretion of contrast by both kidneys. There is a 3.5 cm left renal inferior pole cyst. The visualized ureters appear unremarkable. The urinary bladder is collapsed. Stomach/Bowel: There is food bolus in the distal esophagus above the GE junction. There is mild dilatation of the esophagus with fluid content. Clinical correlation is recommended to evaluate for possibility of an impacted food at the GE junction. There is no bowel obstruction or active inflammation. The appendix is normal. Vascular/Lymphatic: Mild atherosclerotic calcification of the abdominal aorta. The IVC is unremarkable. No portal venous gas. There is no adenopathy. Reproductive: Hysterectomy.  No adnexal masses. Other: Midline vertical anterior pelvic wall incisional scar. Small fat containing umbilical hernia. Musculoskeletal: Degenerative changes of the spine. No acute osseous pathology. IMPRESSION: 1. Mild dilatation of the esophagus with food bolus in the distal esophagus above the GE junction. Clinical correlation is recommended to evaluate for possibility of an impacted  food at the GE junction. 2. No bowel obstruction. Normal appendix. 3. Fatty liver. 4. Aortic Atherosclerosis (ICD10-I70.0). Electronically Signed   By: Anner Crete M.D.   On: 10/16/2021 23:19    Procedures Procedures   Medications Ordered in ED Medications  sodium chloride 0.9 % bolus 1,000 mL (has no administration in time range)  HYDROmorphone (DILAUDID) injection 0.5 mg (has no administration in time range)  ondansetron (ZOFRAN) injection 4 mg (4 mg Intravenous Given 10/16/21 2257)  iohexol (OMNIPAQUE) 300 MG/ML solution 100 mL (100 mLs Intravenous Contrast  Given 10/16/21 2302)    ED Course  I have reviewed the triage vital signs and the nursing notes.  Pertinent labs & imaging results that were available during my care of the patient were reviewed by me and considered in my medical decision making (see chart for details).    MDM Rules/Calculators/A&P                          Labs sent. Iv ns bolus. Zofran iv.   Reviewed nursing notes and prior charts for additional history.   Labs reviewed/interpreted by me - wbc mildly high.   CT reviewed/interpreted by me - ?esophageal food bolus/impaction.  Although patient did not present as or mention food getting stuck while eating, in retrospect she indicates around 1 pm yesterday was eating stewed beef, and then vomiting started right then. Pt also now says in past if/when eating large bolus of food/meat, that occasionally it will get hung up.   GI consulted.   Will plan to transfer to [facility] for [facility]oscopy. Discussed w Dr Michail Sermon - he requests pt go to [facility] ED and he be notified on her arrival.       Final Clinical Impression(s) / ED Diagnoses Final diagnoses:  None    Rx / DC Orders ED Discharge Orders     None           Lajean Saver, MD 10/16/21 2357

## 2021-10-16 NOTE — ED Triage Notes (Signed)
C/o vomiting x 2 days.

## 2021-10-16 NOTE — Patient Instructions (Signed)
Nancy Gilmore, thank you for joining Mar Daring, PA-C for today's virtual visit.  While this provider is not your primary care provider (PCP), if your PCP is located in our provider database this encounter information will be shared with them immediately following your visit.  Consent: (Patient) Nancy Gilmore provided verbal consent for this virtual visit at the beginning of the encounter.  Current Medications:  Current Outpatient Medications:    omeprazole (PRILOSEC) 40 MG capsule, Take 1 capsule (40 mg total) by mouth daily., Disp: 30 capsule, Rfl: 0   ondansetron (ZOFRAN-ODT) 4 MG disintegrating tablet, Take 1 tablet (4 mg total) by mouth every 8 (eight) hours as needed for nausea or vomiting., Disp: 20 tablet, Rfl: 0   buPROPion (WELLBUTRIN SR) 200 MG 12 hr tablet, Take 1 tablet (200 mg total) by mouth daily., Disp: 30 tablet, Rfl: 0   COVID-19 At Home Antigen Test (CARESTART COVID-19 HOME TEST) KIT, Use as directed, Disp: 2 each, Rfl: 0   estradiol (VIVELLE-DOT) 0.1 MG/24HR patch, APPLY 1 PATCH TWICE WEEKLY AS DIRECTED., Disp: 8 patch, Rfl: 12   estradiol (VIVELLE-DOT) 0.1 MG/24HR patch, APPLY 1 PATCH TWICE WEEKLY AS DIRECTED., Disp: 8 patch, Rfl: 3   ondansetron (ZOFRAN) 4 MG tablet, Take 1 tablet (4 mg total) by mouth every 8 (eight) hours as needed for nausea or vomiting., Disp: 20 tablet, Rfl: 0   Medications ordered in this encounter:  Meds ordered this encounter  Medications   ondansetron (ZOFRAN-ODT) 4 MG disintegrating tablet    Sig: Take 1 tablet (4 mg total) by mouth every 8 (eight) hours as needed for nausea or vomiting.    Dispense:  20 tablet    Refill:  0    Order Specific Question:   Supervising Provider    Answer:   Sabra Heck, BRIAN [[id number]]   omeprazole (PRILOSEC) 40 MG capsule    Sig: Take 1 capsule (40 mg total) by mouth daily.    Dispense:  30 capsule    Refill:  0    Order Specific Question:   Supervising Provider    Answer:   Sabra Heck, [place]      *If you need refills on other medications prior to your next appointment, please contact your pharmacy*  Follow-Up: Call back or seek an in-person evaluation if the symptoms worsen or if the condition fails to improve as anticipated.  Other Instructions Hiatal Hernia A hiatal hernia occurs when part of the stomach slides above the muscle that separates the abdomen from the chest (diaphragm). A person can be born with a hiatal hernia (congenital), or it may develop over time. In almost all cases of hiatal hernia, only the top part of the stomach pushes through the diaphragm. Many people have a hiatal hernia with no symptoms. The larger the hernia, the more likely it is that you will have symptoms. In some cases, a hiatal hernia allows stomach acid to flow back into the tube that carries food from your mouth to your stomach (esophagus). This may cause heartburn symptoms. Severe heartburn symptoms may mean that you have developed a condition called gastroesophageal reflux disease (GERD). What are the causes? This condition is caused by a weakness in the opening (hiatus) where the esophagus passes through the diaphragm to attach to the upper part of the stomach. A person may be born with a weakness in the hiatus, or a weakness can develop over time. What increases the risk? This condition is more likely to develop in: Older  people. Age is a major risk factor for a hiatal hernia, especially if you are over the age of 4. Pregnant women. People who are overweight. People who have frequent constipation. What are the signs or symptoms? Symptoms of this condition usually develop in the form of GERD symptoms. Symptoms include: Heartburn. Belching. Indigestion. Trouble swallowing. Coughing or wheezing. Sore throat. Hoarseness. Chest pain. Nausea and vomiting. How is this diagnosed? This condition may be diagnosed during testing for GERD. Tests that may be done include: X-rays of your stomach  or chest. An upper gastrointestinal (GI) series. This is an X-ray exam of your GI tract that is taken after you swallow a chalky liquid that shows up clearly on the X-ray. Endoscopy. This is a procedure to look into your stomach using a thin, flexible tube that has a tiny camera and light on the end of it. How is this treated? This condition may be treated by: Dietary and lifestyle changes to help reduce GERD symptoms. Medicines. These may include: Over-the-counter antacids. Medicines that make your stomach empty more quickly. Medicines that block the production of stomach acid (H2 blockers). Stronger medicines to reduce stomach acid (proton pump inhibitors). Surgery to repair the hernia, if other treatments are not helping. If you have no symptoms, you may not need treatment. Follow these instructions at home: Lifestyle and activity Do not use any products that contain nicotine or tobacco, such as cigarettes and e-cigarettes. If you need help quitting, ask your health care provider. Try to achieve and maintain a healthy body weight. Avoid putting pressure on your abdomen. Anything that puts pressure on your abdomen increases the amount of acid that may be pushed up into your esophagus. Avoid bending over, especially after eating. Raise the head of your bed by putting blocks under the legs. This keeps your head and esophagus higher than your stomach. Do not wear tight clothing around your chest or stomach. Try not to strain when having a bowel movement, when urinating, or when lifting heavy objects. Eating and drinking Avoid foods that can worsen GERD symptoms. These may include: Fatty foods, like fried foods. Citrus fruits, like oranges or lemon. Other foods and drinks that contain acid, like orange juice or tomatoes. Spicy food. Chocolate. Eat frequent small meals instead of three large meals a day. This helps prevent your stomach from getting too full. Eat slowly. Do not lie down  right after eating. Do not eat 1-2 hours before bed. Do not drink beverages with caffeine. These include cola, coffee, cocoa, and tea. Do not drink alcohol. General instructions Take over-the-counter and prescription medicines only as told by your health care provider. Keep all follow-up visits as told by your health care provider. This is important. Contact a health care provider if: Your symptoms are not controlled with medicines or lifestyle changes. You are having trouble swallowing. You have coughing or wheezing that will not go away. Get help right away if: Your pain is getting worse. Your pain spreads to your arms, neck, jaw, teeth, or back. You have shortness of breath. You sweat for no reason. You feel sick to your stomach (nauseous) or you vomit. You vomit blood. You have bright red blood in your stools. You have black, tarry stools. Summary A hiatal hernia occurs when part of the stomach slides above the muscle that separates the abdomen from the chest (diaphragm). A person may be born with a weakness in the hiatus, or a weakness can develop over time. Symptoms of hiatal hernia may  include heartburn, trouble swallowing, or sore throat. Management of hiatal hernia includes eating frequent small meals instead of three large meals a day. Get help right away if you vomit blood, have bright red blood in your stools, or have black, tarry stools. This information is not intended to replace advice given to you by your health care provider. Make sure you discuss any questions you have with your health care provider. Document Revised: 09/29/2020 Document Reviewed: 09/29/2020 Elsevier Patient Education  2022 [employer].    If you have been instructed to have an in-person evaluation today at a local Urgent Care facility, please use the link below. It will take you to a list of all of our available [facility], including address, phone number and hours of operation.  Please do not delay care.  [facility]  If you or a family member do not have a primary care provider, use the link below to schedule a visit and establish care. When you choose a [facility] primary care physician or advanced practice provider, you gain a long-term partner in health. Find a Primary Care Provider  Learn more about 's in-office and virtual care options: [facility]

## 2021-10-17 ENCOUNTER — Encounter (HOSPITAL_COMMUNITY): Payer: Self-pay

## 2021-10-17 ENCOUNTER — Emergency Department (HOSPITAL_COMMUNITY): Payer: No Typology Code available for payment source | Admitting: Anesthesiology

## 2021-10-17 ENCOUNTER — Encounter (HOSPITAL_COMMUNITY)
Admission: EM | Disposition: A | Discharge status: Home / Self Care | Payer: Self-pay | Source: Home / Self Care | Attending: Emergency Medicine

## 2021-10-17 DIAGNOSIS — B9681 Helicobacter pylori [H. pylori] as the cause of diseases classified elsewhere: Secondary | ICD-10-CM | POA: Diagnosis not present

## 2021-10-17 DIAGNOSIS — Z20822 Contact with and (suspected) exposure to covid-19: Secondary | ICD-10-CM | POA: Diagnosis not present

## 2021-10-17 DIAGNOSIS — K3189 Other diseases of stomach and duodenum: Secondary | ICD-10-CM | POA: Diagnosis not present

## 2021-10-17 DIAGNOSIS — K2900 Acute gastritis without bleeding: Secondary | ICD-10-CM | POA: Diagnosis not present

## 2021-10-17 DIAGNOSIS — K760 Fatty (change of) liver, not elsewhere classified: Secondary | ICD-10-CM | POA: Diagnosis not present

## 2021-10-17 DIAGNOSIS — Z87891 Personal history of nicotine dependence: Secondary | ICD-10-CM | POA: Diagnosis not present

## 2021-10-17 DIAGNOSIS — T18128A Food in esophagus causing other injury, initial encounter: Secondary | ICD-10-CM | POA: Diagnosis not present

## 2021-10-17 DIAGNOSIS — R1310 Dysphagia, unspecified: Secondary | ICD-10-CM | POA: Diagnosis present

## 2021-10-17 DIAGNOSIS — K2100 Gastro-esophageal reflux disease with esophagitis, without bleeding: Secondary | ICD-10-CM | POA: Diagnosis not present

## 2021-10-17 DIAGNOSIS — X58XXXA Exposure to other specified factors, initial encounter: Secondary | ICD-10-CM | POA: Diagnosis not present

## 2021-10-17 DIAGNOSIS — K2289 Other specified disease of esophagus: Secondary | ICD-10-CM | POA: Diagnosis not present

## 2021-10-17 DIAGNOSIS — Z6841 Body Mass Index (BMI) 40.0 and over, adult: Secondary | ICD-10-CM | POA: Diagnosis not present

## 2021-10-17 DIAGNOSIS — W44F3XA Food entering into or through a natural orifice, initial encounter: Secondary | ICD-10-CM | POA: Diagnosis present

## 2021-10-17 HISTORY — PX: ESOPHAGOGASTRODUODENOSCOPY (EGD) WITH PROPOFOL: SHX5813

## 2021-10-17 HISTORY — PX: BIOPSY: SHX5522

## 2021-10-17 HISTORY — PX: FOREIGN BODY REMOVAL: SHX962

## 2021-10-17 LAB — URINALYSIS, ROUTINE W REFLEX MICROSCOPIC
Bilirubin Urine: NEGATIVE
Glucose, UA: NEGATIVE mg/dL
Ketones, ur: 80 mg/dL — AB
Leukocytes,Ua: NEGATIVE
Nitrite: NEGATIVE
Protein, ur: 30 mg/dL — AB
Specific Gravity, Urine: 1.01 (ref 1.005–1.030)
pH: 7.5 (ref 5.0–8.0)

## 2021-10-17 LAB — RESP PANEL BY RT-PCR (FLU A&B, COVID) ARPGX2
Influenza A by PCR: NEGATIVE
Influenza B by PCR: NEGATIVE
SARS Coronavirus 2 by RT PCR: NEGATIVE

## 2021-10-17 LAB — URINALYSIS, MICROSCOPIC (REFLEX)
Bacteria, UA: NONE SEEN
WBC, UA: NONE SEEN WBC/hpf (ref 0–5)

## 2021-10-17 SURGERY — ESOPHAGOGASTRODUODENOSCOPY (EGD) WITH PROPOFOL
Anesthesia: General

## 2021-10-17 MED ORDER — MIDAZOLAM HCL 2 MG/2ML IJ SOLN
INTRAMUSCULAR | Status: AC
  Filled 2021-10-17: qty 2

## 2021-10-17 MED ORDER — FENTANYL CITRATE (PF) 100 MCG/2ML IJ SOLN
INTRAMUSCULAR | Status: AC
  Filled 2021-10-17: qty 2

## 2021-10-17 MED ORDER — LACTATED RINGERS IV SOLN
INTRAVENOUS | Status: AC | PRN
  Administered 2021-10-17: 20 mL/h via INTRAVENOUS

## 2021-10-17 MED ORDER — PROPOFOL 500 MG/50ML IV EMUL
INTRAVENOUS | Status: DC | PRN
  Administered 2021-10-17: 200 ug/kg/min via INTRAVENOUS

## 2021-10-17 MED ORDER — SCOPOLAMINE 1 MG/3DAYS TD PT72
MEDICATED_PATCH | TRANSDERMAL | Status: AC
  Filled 2021-10-17: qty 1

## 2021-10-17 MED ORDER — MIDAZOLAM HCL 5 MG/5ML IJ SOLN
INTRAMUSCULAR | Status: DC | PRN
  Administered 2021-10-17: 1 mg via INTRAVENOUS

## 2021-10-17 MED ORDER — SCOPOLAMINE 1 MG/3DAYS TD PT72
MEDICATED_PATCH | TRANSDERMAL | Status: DC | PRN
  Administered 2021-10-17: 1 via TRANSDERMAL

## 2021-10-17 MED ORDER — SODIUM CHLORIDE 0.9 % IV SOLN: INTRAVENOUS | Status: DC

## 2021-10-17 MED ORDER — ONDANSETRON HCL 4 MG/2ML IJ SOLN
4.0000 mg | Freq: Once | INTRAMUSCULAR | Status: AC
  Administered 2021-10-17: 4 mg via INTRAVENOUS
  Filled 2021-10-17: qty 2

## 2021-10-17 MED ORDER — PROPOFOL 10 MG/ML IV BOLUS
INTRAVENOUS | Status: DC | PRN
  Administered 2021-10-17: 150 mg via INTRAVENOUS
  Administered 2021-10-17: 50 mg via INTRAVENOUS

## 2021-10-17 MED ORDER — PROPOFOL 10 MG/ML IV BOLUS
INTRAVENOUS | Status: AC
  Filled 2021-10-17: qty 20

## 2021-10-17 MED ORDER — LACTATED RINGERS IV SOLN: INTRAVENOUS | Status: DC | PRN

## 2021-10-17 MED ORDER — LIDOCAINE HCL (CARDIAC) PF 100 MG/5ML IV SOSY
PREFILLED_SYRINGE | INTRAVENOUS | Status: DC | PRN
  Administered 2021-10-17: 100 mg via INTRAVENOUS

## 2021-10-17 MED ORDER — DEXAMETHASONE SODIUM PHOSPHATE 10 MG/ML IJ SOLN
INTRAMUSCULAR | Status: DC | PRN
  Administered 2021-10-17: 10 mg via INTRAVENOUS

## 2021-10-17 MED ORDER — ONDANSETRON HCL 4 MG/2ML IJ SOLN
INTRAMUSCULAR | Status: DC | PRN
  Administered 2021-10-17: 4 mg via INTRAVENOUS

## 2021-10-17 SURGICAL SUPPLY — 14 items

## 2021-10-17 NOTE — Progress Notes (Signed)
Referring Provider: Dr. Ashok Cordia Primary Care Physician:  Doree Albee, MD (Inactive) Primary Gastroenterologist: Althia Forts  Reason for Consultation:  Food Impaction  HPI: Nancy Gilmore is a 55 y.o. female transferred from [facility] with a food impaction based on history but CT scan also showed a food bolus above the GEJ. She felt food hang up shortly after eating beef stew two days ago and has not been able to keep liquids or saliva down since then. Has vomited multiple times but has felt ongoing sensation of pressure in her neck. Was able to swallow a sip of water this morning without vomiting but had some belching. Reports intermittent dysphagia to solid food and denies previous food impaction or EGD. History of GERD on a PPI. Denies breakthrough heartburn.  Past Medical History:  Diagnosis Date   Cellulitis of leg, right    Complication of anesthesia    nausea and vomiting   Fatty liver    Fibroids    uterine   GERD (gastroesophageal reflux disease) 08/11/2019   Obesity (BMI 30-39.9) 08/11/2019   Vitamin D deficiency disease 08/11/2019    Past Surgical History:  Procedure Laterality Date   ABDOMINAL HYSTERECTOMY     TAH&BSO Age 67 yrs   CESAREAN SECTION     NEUROPLASTY / TRANSPOSITION ULNAR NERVE AT ELBOW Bilateral     Prior to Admission medications   Medication Sig Start Date End Date Taking? Authorizing Provider  buPROPion (WELLBUTRIN SR) 200 MG 12 hr tablet Take 1 tablet (200 mg total) by mouth daily. 06/13/21   Esaw Grandchild, NP  COVID-19 At Home Antigen Test [facility] COVID-19 HOME TEST) KIT Use as directed 06/29/21   Jefm Bryant, RPH  estradiol (VIVELLE-DOT) 0.1 MG/24HR patch APPLY 1 PATCH TWICE WEEKLY AS DIRECTED. 12/09/20 12/09/21  Louretta Shorten, MD  estradiol (VIVELLE-DOT) 0.1 MG/24HR patch APPLY 1 PATCH TWICE WEEKLY AS DIRECTED. 10/11/21     omeprazole (PRILOSEC) 40 MG capsule Take 1 capsule (40 mg total) by mouth daily. 10/16/21   Mar Daring, PA-C   ondansetron (ZOFRAN) 4 MG tablet Take 1 tablet (4 mg total) by mouth every 8 (eight) hours as needed for nausea or vomiting. 10/15/21   Evelina Dun A, FNP  ondansetron (ZOFRAN-ODT) 4 MG disintegrating tablet Take 1 tablet (4 mg total) by mouth every 8 (eight) hours as needed for nausea or vomiting. 10/16/21   Mar Daring, PA-C    Scheduled Meds: Continuous Infusions:  sodium chloride     lactated ringers 20 mL/hr (10/17/21 0623)   PRN Meds:.lactated ringers  Allergies as of 10/16/2021 - Review Complete 10/16/2021  Allergen Reaction Noted   Morphine  05/28/2018   Naproxen Hives 05/28/2018    Family History  Problem Relation Age of Onset   Breast cancer Maternal Aunt    Hyperlipidemia Mother    Hypertension Mother    Stroke Mother    Depression Mother    Obesity Mother    Cancer Father    Alcohol abuse Father    Obesity Father     Social History   Socioeconomic History   Marital status: Single    Spouse name: Not on file   Number of children: Not on file   Years of education: Not on file   Highest education level: Not on file  Occupational History   Occupation: Animal nutritionist    Employer:   Tobacco Use   Smoking status: Former    Types: Cigarettes    Quit date: 11/12/2006  Years since quitting: 14.9   Smokeless tobacco: Never  Vaping Use   Vaping Use: Never used  Substance and Sexual Activity   Alcohol use: No   Drug use: No   Sexual activity: Not on file  Other Topics Concern   Not on file  Social History Narrative   Single,lives with 2 kids.Security at [employer].   Social Determinants of Health   Financial Resource Strain: Not on file  Food Insecurity: Not on file  Transportation Needs: Not on file  Physical Activity: Not on file  Stress: Not on file  Social Connections: Not on file  Intimate Partner Violence: Not on file    Review of Systems: All negative except as stated above in HPI.  Physical Exam: Vital signs: Vitals:    10/17/21 0530 10/17/21 0620  BP: 105/61 (!) 174/61  Pulse: 68 72  Resp: 17 20  Temp:  98.9 F (37.2 C)  SpO2: 98% 94%     General:   Lethargic,  obese, pleasant and cooperative in NAD Head: normocephalic, atraumatic Eyes: anicteric sclera ENT: oropharynx clear Neck: supple, nontender Lungs:  Clear throughout to auscultation.   No wheezes, crackles, or rhonchi. No acute distress. Heart:  Regular rate and rhythm; no murmurs, clicks, rubs,  or gallops. Abdomen: epigastric tenderness with guarding, otherwise nontender, soft, nondistended, +BS  Rectal:  Deferred Ext: no edema  GI:  Lab Results: Recent Labs    10/16/21 1907  WBC 14.1*  HGB 15.2*  HCT 43.9  PLT 305   BMET Recent Labs    10/16/21 1907  NA 142  K 3.6  CL 107  CO2 23  GLUCOSE 138*  BUN 18  CREATININE 0.81  CALCIUM 9.4   LFT Recent Labs    10/16/21 1907  PROT 8.5*  ALBUMIN 4.8  AST 24  ALT 20  ALKPHOS 46  BILITOT 0.9   PT/INR No results for input(s): LABPROT, INR in the last 72 hours.   Studies/Results: CT Abdomen Pelvis W Contrast  Result Date: 10/16/2021 CLINICAL DATA:  Abdominal pain. EXAM: CT ABDOMEN AND PELVIS WITH CONTRAST TECHNIQUE: Multidetector CT imaging of the abdomen and pelvis was performed using the standard protocol following bolus administration of intravenous contrast. CONTRAST:  135m OMNIPAQUE IOHEXOL 300 MG/ML  SOLN COMPARISON:  None. FINDINGS: Lower chest: The visualized lung bases are clear. No intra-abdominal free air or free fluid. Hepatobiliary: Fatty liver. No intrahepatic biliary dilatation. The gallbladder is unremarkable. Pancreas: Unremarkable. No pancreatic ductal dilatation or surrounding inflammatory changes. Spleen: Normal in size without focal abnormality. Adrenals/Urinary Tract: The adrenal glands unremarkable. There is no hydronephrosis on either side. There is symmetric enhancement and excretion of contrast by both kidneys. There is a 3.5 cm left renal inferior  pole cyst. The visualized ureters appear unremarkable. The urinary bladder is collapsed. Stomach/Bowel: There is food bolus in the distal esophagus above the GE junction. There is mild dilatation of the esophagus with fluid content. Clinical correlation is recommended to evaluate for possibility of an impacted food at the GE junction. There is no bowel obstruction or active inflammation. The appendix is normal. Vascular/Lymphatic: Mild atherosclerotic calcification of the abdominal aorta. The IVC is unremarkable. No portal venous gas. There is no adenopathy. Reproductive: Hysterectomy.  No adnexal masses. Other: Midline vertical anterior pelvic wall incisional scar. Small fat containing umbilical hernia. Musculoskeletal: Degenerative changes of the spine. No acute osseous pathology. IMPRESSION: 1. Mild dilatation of the esophagus with food bolus in the distal esophagus above the GE junction. Clinical  correlation is recommended to evaluate for possibility of an impacted food at the GE junction. 2. No bowel obstruction. Normal appendix. 3. Fatty liver. 4. Aortic Atherosclerosis (ICD10-I70.0). Electronically Signed   By: Anner Crete M.D.   On: 10/16/2021 23:19    Impression/Plan: Food impaction in need of an EGD for further evaluation and will need a f/u EGD for dilation if a stricture or ring is present since trauma from food likely present. Risks/benefits of EGD discussed and she agrees to proceed.    LOS: 0 days   Lear Ng  10/17/2021, 6:45 AM  Questions please call [phone] Patient ID: Francene Castle, female   DOB: 09-18-1966, 55 y.o.   MRN: [id number]

## 2021-10-17 NOTE — Addendum Note (Signed)
Addendum  created 10/17/21 0941 by Lissa Morales, CRNA   Charge Capture section accepted

## 2021-10-17 NOTE — Anesthesia Postprocedure Evaluation (Signed)
Anesthesia Post Note  Patient: Nancy Gilmore  Procedure(s) Performed: ESOPHAGOGASTRODUODENOSCOPY (EGD) WITH PROPOFOL FOREIGN BODY REMOVAL BIOPSY     Patient location during evaluation: PACU Anesthesia Type: General Level of consciousness: awake and alert Pain management: pain level controlled Vital Signs Assessment: post-procedure vital signs reviewed and stable Respiratory status: spontaneous breathing, nonlabored ventilation, respiratory function stable and patient connected to nasal cannula oxygen Cardiovascular status: blood pressure returned to baseline and stable Postop Assessment: no apparent nausea or vomiting Anesthetic complications: no   No notable events documented.  Last Vitals:  Vitals:   10/17/21 0756 10/17/21 0758  BP:  115/74  Pulse: 68 67  Resp: (!) 27 (!) 27  Temp:    SpO2: 93% 94%    Last Pain:  Vitals:   10/17/21 0751  TempSrc: Axillary  PainSc:                  [name] S

## 2021-10-17 NOTE — Transfer of Care (Signed)
Immediate Anesthesia Transfer of Care Note  Patient: Nancy Gilmore  Procedure(s) Performed: ESOPHAGOGASTRODUODENOSCOPY (EGD) WITH PROPOFOL FOREIGN BODY REMOVAL BIOPSY  Patient Location: PACU  Anesthesia Type:General  Level of Consciousness: awake, alert , oriented and patient cooperative  Airway & Oxygen Therapy: Patient Spontanous Breathing and Patient connected to face mask oxygen  Post-op Assessment: Report given to RN, Post -op Vital signs reviewed and stable and Patient moving all extremities X 4  Post vital signs: stable  Last Vitals:  Vitals Value Taken Time  BP 113/63 10/17/21 0751  Temp    Pulse 74 10/17/21 0753  Resp 34 10/17/21 0753  SpO2 96 % 10/17/21 0753  Vitals shown include unvalidated device data.  Last Pain:  Vitals:   10/17/21 0620  TempSrc: Oral  PainSc: 0-No pain         Complications: No notable events documented.

## 2021-10-17 NOTE — Interval H&P Note (Signed)
History and Physical Interval Note:  10/17/2021 6:51 AM  Nancy Gilmore  has presented today for surgery, with the diagnosis of food bolus.  The various methods of treatment have been discussed with the patient and family. After consideration of risks, benefits and other options for treatment, the patient has consented to  Procedure(s): ESOPHAGOGASTRODUODENOSCOPY (EGD) WITH PROPOFOL (N/A) FOREIGN BODY REMOVAL (N/A) as a surgical intervention.  The patient's history has been reviewed, patient examined, no change in status, stable for surgery.  I have reviewed the patient's chart and labs.  Questions were answered to the patient's satisfaction.     Lear Ng

## 2021-10-17 NOTE — H&P (View-Only) (Signed)
Referring Provider: Dr. Ashok Cordia Primary Care Physician:  Doree Albee, MD (Inactive) Primary Gastroenterologist: Althia Forts  Reason for Consultation:  Food Impaction  HPI: Nancy Gilmore is a 55 y.o. female transferred from [facility] with a food impaction based on history but CT scan also showed a food bolus above the GEJ. She felt food hang up shortly after eating beef stew two days ago and has not been able to keep liquids or saliva down since then. Has vomited multiple times but has felt ongoing sensation of pressure in her neck. Was able to swallow a sip of water this morning without vomiting but had some belching. Reports intermittent dysphagia to solid food and denies previous food impaction or EGD. History of GERD on a PPI. Denies breakthrough heartburn.  Past Medical History:  Diagnosis Date   Cellulitis of leg, right    Complication of anesthesia    nausea and vomiting   Fatty liver    Fibroids    uterine   GERD (gastroesophageal reflux disease) 08/11/2019   Obesity (BMI 30-39.9) 08/11/2019   Vitamin D deficiency disease 08/11/2019    Past Surgical History:  Procedure Laterality Date   ABDOMINAL HYSTERECTOMY     TAH&BSO Age 30 yrs   CESAREAN SECTION     NEUROPLASTY / TRANSPOSITION ULNAR NERVE AT ELBOW Bilateral     Prior to Admission medications   Medication Sig Start Date End Date Taking? Authorizing Provider  buPROPion (WELLBUTRIN SR) 200 MG 12 hr tablet Take 1 tablet (200 mg total) by mouth daily. 06/13/21   Esaw Grandchild, NP  COVID-19 At Home Antigen Test [facility] COVID-19 HOME TEST) KIT Use as directed 06/29/21   Jefm Bryant, RPH  estradiol (VIVELLE-DOT) 0.1 MG/24HR patch APPLY 1 PATCH TWICE WEEKLY AS DIRECTED. 12/09/20 12/09/21  Louretta Shorten, MD  estradiol (VIVELLE-DOT) 0.1 MG/24HR patch APPLY 1 PATCH TWICE WEEKLY AS DIRECTED. 10/11/21     omeprazole (PRILOSEC) 40 MG capsule Take 1 capsule (40 mg total) by mouth daily. 10/16/21   Mar Daring, PA-C   ondansetron (ZOFRAN) 4 MG tablet Take 1 tablet (4 mg total) by mouth every 8 (eight) hours as needed for nausea or vomiting. 10/15/21   Evelina Dun A, FNP  ondansetron (ZOFRAN-ODT) 4 MG disintegrating tablet Take 1 tablet (4 mg total) by mouth every 8 (eight) hours as needed for nausea or vomiting. 10/16/21   Mar Daring, PA-C    Scheduled Meds: Continuous Infusions:  sodium chloride     lactated ringers 20 mL/hr (10/17/21 0623)   PRN Meds:.lactated ringers  Allergies as of 10/16/2021 - Review Complete 10/16/2021  Allergen Reaction Noted   Morphine  05/28/2018   Naproxen Hives 05/28/2018    Family History  Problem Relation Age of Onset   Breast cancer Maternal Aunt    Hyperlipidemia Mother    Hypertension Mother    Stroke Mother    Depression Mother    Obesity Mother    Cancer Father    Alcohol abuse Father    Obesity Father     Social History   Socioeconomic History   Marital status: Single    Spouse name: Not on file   Number of children: Not on file   Years of education: Not on file   Highest education level: Not on file  Occupational History   Occupation: Animal nutritionist    Employer: [employer]  Tobacco Use   Smoking status: Former    Types: Cigarettes    Quit date: 11/12/2006  Years since quitting: 14.9   Smokeless tobacco: Never  Vaping Use   Vaping Use: Never used  Substance and Sexual Activity   Alcohol use: No   Drug use: No   Sexual activity: Not on file  Other Topics Concern   Not on file  Social History Narrative   Single,lives with 2 kids.Security at [employer].   Social Determinants of Health   Financial Resource Strain: Not on file  Food Insecurity: Not on file  Transportation Needs: Not on file  Physical Activity: Not on file  Stress: Not on file  Social Connections: Not on file  Intimate Partner Violence: Not on file    Review of Systems: All negative except as stated above in HPI.  Physical Exam: Vital signs: Vitals:    10/17/21 0530 10/17/21 0620  BP: 105/61 (!) 174/61  Pulse: 68 72  Resp: 17 20  Temp:  98.9 F (37.2 C)  SpO2: 98% 94%     General:   Lethargic,  obese, pleasant and cooperative in NAD Head: normocephalic, atraumatic Eyes: anicteric sclera ENT: oropharynx clear Neck: supple, nontender Lungs:  Clear throughout to auscultation.   No wheezes, crackles, or rhonchi. No acute distress. Heart:  Regular rate and rhythm; no murmurs, clicks, rubs,  or gallops. Abdomen: epigastric tenderness with guarding, otherwise nontender, soft, nondistended, +BS  Rectal:  Deferred Ext: no edema  GI:  Lab Results: Recent Labs    10/16/21 1907  WBC 14.1*  HGB 15.2*  HCT 43.9  PLT 305   BMET Recent Labs    10/16/21 1907  NA 142  K 3.6  CL 107  CO2 23  GLUCOSE 138*  BUN 18  CREATININE 0.81  CALCIUM 9.4   LFT Recent Labs    10/16/21 1907  PROT 8.5*  ALBUMIN 4.8  AST 24  ALT 20  ALKPHOS 46  BILITOT 0.9   PT/INR No results for input(s): LABPROT, INR in the last 72 hours.   Studies/Results: CT Abdomen Pelvis W Contrast  Result Date: 10/16/2021 CLINICAL DATA:  Abdominal pain. EXAM: CT ABDOMEN AND PELVIS WITH CONTRAST TECHNIQUE: Multidetector CT imaging of the abdomen and pelvis was performed using the standard protocol following bolus administration of intravenous contrast. CONTRAST:  18m OMNIPAQUE IOHEXOL 300 MG/ML  SOLN COMPARISON:  None. FINDINGS: Lower chest: The visualized lung bases are clear. No intra-abdominal free air or free fluid. Hepatobiliary: Fatty liver. No intrahepatic biliary dilatation. The gallbladder is unremarkable. Pancreas: Unremarkable. No pancreatic ductal dilatation or surrounding inflammatory changes. Spleen: Normal in size without focal abnormality. Adrenals/Urinary Tract: The adrenal glands unremarkable. There is no hydronephrosis on either side. There is symmetric enhancement and excretion of contrast by both kidneys. There is a 3.5 cm left renal inferior  pole cyst. The visualized ureters appear unremarkable. The urinary bladder is collapsed. Stomach/Bowel: There is food bolus in the distal esophagus above the GE junction. There is mild dilatation of the esophagus with fluid content. Clinical correlation is recommended to evaluate for possibility of an impacted food at the GE junction. There is no bowel obstruction or active inflammation. The appendix is normal. Vascular/Lymphatic: Mild atherosclerotic calcification of the abdominal aorta. The IVC is unremarkable. No portal venous gas. There is no adenopathy. Reproductive: Hysterectomy.  No adnexal masses. Other: Midline vertical anterior pelvic wall incisional scar. Small fat containing umbilical hernia. Musculoskeletal: Degenerative changes of the spine. No acute osseous pathology. IMPRESSION: 1. Mild dilatation of the esophagus with food bolus in the distal esophagus above the GE junction. Clinical  correlation is recommended to evaluate for possibility of an impacted food at the GE junction. 2. No bowel obstruction. Normal appendix. 3. Fatty liver. 4. Aortic Atherosclerosis (ICD10-I70.0). Electronically Signed   By: Anner Crete M.D.   On: 10/16/2021 23:19    Impression/Plan: Food impaction in need of an EGD for further evaluation and will need a f/u EGD for dilation if a stricture or ring is present since trauma from food likely present. Risks/benefits of EGD discussed and she agrees to proceed.    LOS: 0 days   Lear Ng  10/17/2021, 6:45 AM  Questions please call [phone] Patient ID: Nancy Gilmore, female   DOB: 12-02-1965, 55 y.o.   MRN: [id number]

## 2021-10-17 NOTE — Anesthesia Procedure Notes (Signed)
Procedure Name: Intubation Date/Time: 10/17/2021 7:19 AM Performed by: Lind Covert, CRNA Pre-anesthesia Checklist: Patient identified, Emergency Drugs available, Suction available and Patient being monitored Patient Re-evaluated:Patient Re-evaluated prior to induction Oxygen Delivery Method: Circle system utilized Preoxygenation: Pre-oxygenation with 100% oxygen Induction Type: IV induction, Rapid sequence and Cricoid Pressure applied Laryngoscope Size: Glidescope and 3 Grade View: Grade II Tube type: Oral Tube size: 7.0 mm Number of attempts: 1 Airway Equipment and Method: Stylet and Oral airway Placement Confirmation: ETT inserted through vocal cords under direct vision, positive ETCO2 and breath sounds checked- equal and bilateral Secured at: 22 cm Tube secured with: Tape Dental Injury: Teeth and Oropharynx as per pre-operative assessment  Difficulty Due To: Difficulty was anticipated, Difficult Airway- due to limited oral opening, Difficult Airway- due to dentition and Difficult Airway- due to large tongue Comments: Elective glidescope MAP 3

## 2021-10-17 NOTE — ED Notes (Signed)
O2 dropping down to 88-89% on RA, 2lpm [place] applied to pt. O2 increased to 96%

## 2021-10-17 NOTE — Anesthesia Preprocedure Evaluation (Signed)
Anesthesia Evaluation  Patient identified by MRN, date of birth, ID band Patient awake    Reviewed: Allergy & Precautions, H&P , NPO status , Patient's Chart, lab work & pertinent test results  Airway Mallampati: II  TM Distance: <3 FB Neck ROM: Full    Dental no notable dental hx.    Pulmonary neg pulmonary ROS, former smoker,    Pulmonary exam normal breath sounds clear to auscultation       Cardiovascular negative cardio ROS Normal cardiovascular exam Rhythm:Regular Rate:Normal     Neuro/Psych negative neurological ROS  negative psych ROS   GI/Hepatic negative GI ROS, Fatty liver   Endo/Other  Morbid obesity  Renal/GU negative Renal ROS  negative genitourinary   Musculoskeletal negative musculoskeletal ROS (+)   Abdominal   Peds negative pediatric ROS (+)  Hematology negative hematology ROS (+)   Anesthesia Other Findings   Reproductive/Obstetrics negative OB ROS                             Anesthesia Physical Anesthesia Plan  ASA: 2  Anesthesia Plan: General   Post-op Pain Management:    Induction: Intravenous and Rapid sequence  PONV Risk Score and Plan: 3 and Ondansetron, Dexamethasone and Treatment may vary due to age or medical condition  Airway Management Planned: Oral ETT  Additional Equipment:   Intra-op Plan:   Post-operative Plan: Extubation in OR  Informed Consent: I have reviewed the patients History and Physical, chart, labs and discussed the procedure including the risks, benefits and alternatives for the proposed anesthesia with the patient or authorized representative who has indicated his/her understanding and acceptance.     Dental advisory given  Plan Discussed with: CRNA and Surgeon  Anesthesia Plan Comments:         Anesthesia Quick Evaluation

## 2021-10-17 NOTE — Discharge Instructions (Signed)
Clear liquid diet ONLY today. Starting tomorrow advance diet slowly but avoid meats for 3 days. When you eat solid food take small bites, eat slow, and chew very well. Will plan to repeat your endoscopy in 6 weeks for dilation of your esophagus. My office will arrange f/u and the repeat EGD. Start taking the Omeprazole 40 mg twice a day (30 minutes before breakfast and 30 minutes before dinner). Will call you when biopsies are complete.

## 2021-10-17 NOTE — Op Note (Signed)
[facility] Patient Name: Nancy Gilmore Procedure Date: 10/17/2021 MRN: [id number] Attending MD: Lear Ng , MD Date of Birth: 10-24-1966 CSN: [id number] Age: 55 Admit Type: Outpatient Procedure:                Upper GI endoscopy Indications:              Dysphagia, Foreign body in the esophagus Providers:                Lear Ng, MD, Carmie End, RN,                            Frazier Richards, Technician, Enrigue Catena, CRNA Referring MD:             ER Medicines:                Propofol per Anesthesia, Monitored Anesthesia Care Complications:            No immediate complications. Estimated Blood Loss:     Estimated blood loss was minimal. Procedure:                Pre-Anesthesia Assessment:                           - Prior to the procedure, a History and Physical                            was performed, and patient medications and                            allergies were reviewed. The patient's tolerance of                            previous anesthesia was also reviewed. The risks                            and benefits of the procedure and the sedation                            options and risks were discussed with the patient.                            All questions were answered, and informed consent                            was obtained. Prior Anticoagulants: The patient has                            taken no previous anticoagulant or antiplatelet                            agents. ASA Grade Assessment: III - A patient with                            severe systemic disease. After reviewing the risks  and benefits, the patient was deemed in                            satisfactory condition to undergo the procedure.                           After obtaining informed consent, the endoscope was                            passed under direct vision. Throughout the                            procedure, the  patient's blood pressure, pulse, and                            oxygen saturations were monitored continuously. The                            [id number]-[id number] ([id number] Olympus endoscope was introduced                            through the mouth, and advanced to the second part                            of duodenum. The upper GI endoscopy was                            accomplished without difficulty. The patient                            tolerated the procedure well. Scope In: Scope Out: Findings:      Food was found at the gastroesophageal junction. With insufflation food       advanced into the stomach revealing edematous ulcerated mucosa and a       distal esophageal stricture.      Mucosal changes including ringed esophagus and longitudinal furrows were       found in the mid esophagus and in the distal esophagus. Biopsies were       taken with a cold forceps for histology. Estimated blood loss was       minimal.      The Z-line was found 36 cm from the incisors.      Segmental mild inflammation characterized by congestion (edema) and       erythema was found in the gastric antrum. Biopsies were taken with a       cold forceps for histology. Estimated blood loss was minimal.      The cardia and gastric fundus were normal on retroflexion.      Segmental moderate mucosal changes characterized by congestion, erythema       and nodularity were found in the duodenal bulb. Biopsies were taken with       a cold forceps for histology. Estimated blood loss was minimal.      The second portion of the duodenum was normal.      LA Grade D (one or more mucosal breaks involving at least 75% of       esophageal  circumference) esophagitis with no bleeding was found in the       distal esophagus. Impression:               - Food at the gastroesophageal junction.                           - Esophageal mucosal changes suggestive of                            eosinophilic esophagitis. Biopsied.                            - Z-line, 36 cm from the incisors.                           - Acute gastritis. Biopsied.                           - Mucosal changes in the duodenum. Biopsied.                           - Normal second portion of the duodenum.                           - LA Grade D acute esophagitis with no bleeding. Moderate Sedation:      N/A - MAC procedure Recommendation:           - Patient has a contact number available for                            emergencies. The signs and symptoms of potential                            delayed complications were discussed with the                            patient. Return to normal activities tomorrow.                            Written discharge instructions were provided to the                            patient.                           - Clear liquid diet today and advance as tolerated                            tomorrow.                           - Clear liquid diet only today and then tomorrow                            advance diet slowly but avoid meats for 3 days.  When you eat take small bites, eat slow, and chew                            well. Repeat EGD in 6 weeks for dilation of the                            distal esophageal stricture but await esophageal                            biopsies for EOE. Start taking your Omeprazole 40                            mg twice a day.                           - Await pathology results. Procedure Code(s):        --- Professional ---                           [phone], Esophagogastroduodenoscopy, flexible,                            transoral; with biopsy, single or multiple Diagnosis Code(s):        --- Professional ---                           [phone], Food in esophagus causing other injury,                            initial encounter                           R13.10, Dysphagia, unspecified                           K29.00, Acute gastritis without bleeding                            K20.90, Esophagitis, unspecified without bleeding                           K31.89, Other diseases of stomach and duodenum                           K22.8, Other specified diseases of esophagus                           T18.108A, Unspecified foreign body in esophagus                            causing other injury, initial encounter CPT copyright 2019 [employer]. All rights reserved. The codes documented in this report are preliminary and upon coder review may  be revised to meet current compliance requirements. Lear Ng, MD 10/17/2021 7:51:42 AM This report has been signed electronically. Number of Addenda: 0

## 2021-10-18 ENCOUNTER — Encounter (HOSPITAL_COMMUNITY): Payer: Self-pay | Admitting: Gastroenterology

## 2021-10-19 LAB — SURGICAL PATHOLOGY

## 2021-11-08 ENCOUNTER — Other Ambulatory Visit: Payer: Self-pay | Admitting: Physician Assistant

## 2021-11-08 DIAGNOSIS — K219 Gastro-esophageal reflux disease without esophagitis: Secondary | ICD-10-CM

## 2021-11-09 ENCOUNTER — Other Ambulatory Visit (HOSPITAL_COMMUNITY): Payer: Self-pay

## 2021-11-09 MED ORDER — OMEPRAZOLE 40 MG PO CPDR
40.0000 mg | DELAYED_RELEASE_CAPSULE | Freq: Every day | ORAL | 0 refills | Status: DC
  Filled 2021-11-09: qty 30, 30d supply, fill #0

## 2021-11-15 ENCOUNTER — Ambulatory Visit: Payer: No Typology Code available for payment source | Admitting: Nurse Practitioner

## 2021-11-20 ENCOUNTER — Other Ambulatory Visit (HOSPITAL_COMMUNITY): Payer: Self-pay

## 2021-11-20 ENCOUNTER — Other Ambulatory Visit (HOSPITAL_BASED_OUTPATIENT_CLINIC_OR_DEPARTMENT_OTHER): Payer: Self-pay

## 2021-11-20 MED ORDER — AMOXICILLIN 500 MG PO CAPS
ORAL_CAPSULE | ORAL | 0 refills | Status: DC
  Filled 2021-11-20 (×2): qty 56, 14d supply, fill #0

## 2021-11-20 MED ORDER — OMEPRAZOLE 40 MG PO CPDR
DELAYED_RELEASE_CAPSULE | ORAL | 6 refills | Status: DC
  Filled 2021-11-20 (×2): qty 60, 30d supply, fill #0
  Filled 2021-12-29: qty 60, 30d supply, fill #1
  Filled 2022-02-14: qty 60, 30d supply, fill #2
  Filled 2022-04-19: qty 60, 30d supply, fill #3
  Filled 2022-05-18: qty 60, 30d supply, fill #4
  Filled 2022-06-14: qty 60, 30d supply, fill #5
  Filled 2022-07-14: qty 60, 30d supply, fill #6

## 2021-11-20 MED ORDER — CLARITHROMYCIN 500 MG PO TABS
ORAL_TABLET | ORAL | 0 refills | Status: DC
  Filled 2021-11-20 (×2): qty 28, 14d supply, fill #0

## 2021-11-22 ENCOUNTER — Other Ambulatory Visit: Payer: Self-pay

## 2021-11-22 ENCOUNTER — Encounter: Payer: Self-pay | Admitting: Nurse Practitioner

## 2021-11-22 ENCOUNTER — Ambulatory Visit (INDEPENDENT_AMBULATORY_CARE_PROVIDER_SITE_OTHER): Payer: No Typology Code available for payment source | Admitting: Nurse Practitioner

## 2021-11-22 ENCOUNTER — Other Ambulatory Visit (HOSPITAL_COMMUNITY): Payer: Self-pay

## 2021-11-22 VITALS — BP 138/82 | HR 65 | Ht 65.0 in | Wt 244.0 lb

## 2021-11-22 DIAGNOSIS — F439 Reaction to severe stress, unspecified: Secondary | ICD-10-CM

## 2021-11-22 DIAGNOSIS — Z139 Encounter for screening, unspecified: Secondary | ICD-10-CM | POA: Diagnosis not present

## 2021-11-22 DIAGNOSIS — E66813 Obesity, class 3: Secondary | ICD-10-CM

## 2021-11-22 DIAGNOSIS — W44F3XD Food entering into or through a natural orifice, subsequent encounter: Secondary | ICD-10-CM

## 2021-11-22 DIAGNOSIS — T18128D Food in esophagus causing other injury, subsequent encounter: Secondary | ICD-10-CM | POA: Diagnosis not present

## 2021-11-22 DIAGNOSIS — E8881 Metabolic syndrome: Secondary | ICD-10-CM

## 2021-11-22 DIAGNOSIS — E88819 Insulin resistance, unspecified: Secondary | ICD-10-CM

## 2021-11-22 DIAGNOSIS — N951 Menopausal and female climacteric states: Secondary | ICD-10-CM

## 2021-11-22 DIAGNOSIS — K219 Gastro-esophageal reflux disease without esophagitis: Secondary | ICD-10-CM

## 2021-11-22 DIAGNOSIS — Z6841 Body Mass Index (BMI) 40.0 and over, adult: Secondary | ICD-10-CM

## 2021-11-22 DIAGNOSIS — E559 Vitamin D deficiency, unspecified: Secondary | ICD-10-CM

## 2021-11-22 NOTE — Assessment & Plan Note (Addendum)
Currently not on med. .Last Vitamin D level was normal. Will monitor.

## 2021-11-22 NOTE — Assessment & Plan Note (Signed)
Importance of healthy food choices with portion control discussed as well as eating regularly within 12  hour window.   The need to choose clean green food 50%-75% of time is discussed as well as make water the primary drink and set a goal for 64 ounces daily.  Patient reeducated about the importance of committment to minimum of 150 minutes of exercise per week.  Three meals at set times with snacks allowed between meals but they must be fruit or vegetable.   Aim to eat  over 12 hour period  for example 7 am to 7 pm. Stop after your last meal of the day.  Wt Readings from Last 3 Encounters:  11/22/21 244 lb (110.7 kg)  10/16/21 250 lb (113.4 kg)  06/13/21 236 lb (107 kg)

## 2021-11-22 NOTE — Progress Notes (Signed)
New Patient Office Visit  Subjective:  Patient ID: Nancy Gilmore, female    DOB: 1966-04-27  Age: 56 y.o. MRN: [id number]  CC:  Chief Complaint  Patient presents with   New Patient (Initial Visit)    New pt. Previous PCP Dr. Anastasio Champion. No concerns    HPI Nancy Gilmore presents to establish care. Previous pt of Dr Fredric Mare.  Will be going to GI   HISTORY OF UTERINE FIBROIDS, Goes to Dr. Louretta Shorten, Physician for women in [place] for [place] needs , she has been doing yearly PAP , she has had total hysterectomy for ovarian cysts. Has upcoming appointment for mammogram, she has been doing test every 6 months due to having benign calcifications , scattered sub centimeter cysts. She has had genetic testing done for CA. Uses estradiol patch   GERD ,fatty liver : followed by GI, takes omeprazole 60m daily.  Insulin resistance, Morbid obesity Was previously going to [facility] but she stopped due to not seeing improvement in her weight  Stress at home Pt stated that she has a lot of stress taking care of her family. Denies depression , SI, HI  Vitamin D Deficiency. Currently not on vitamin d supplement.   Past Medical History:  Diagnosis Date   Cellulitis of leg, right    Complication of anesthesia    nausea and vomiting   Fatty liver    Fibroids    uterine   GERD (gastroesophageal reflux disease) 08/11/2019   Obesity (BMI 30-39.9) 08/11/2019   Vitamin D deficiency disease 08/11/2019    Past Surgical History:  Procedure Laterality Date   ABDOMINAL HYSTERECTOMY     TAH&BSO Age [age]yrs   BIOPSY  10/17/2021   Procedure: BIOPSY;  Surgeon: SWilford Corner MD;  Location: [facility];  Service: Endoscopy;;   CESAREAN SECTION     ESOPHAGOGASTRODUODENOSCOPY (EGD) WITH PROPOFOL N/A 10/17/2021   Procedure: ESOPHAGOGASTRODUODENOSCOPY (EGD) WITH PROPOFOL;  Surgeon: SWilford Corner MD;  Location: [facility];  Service: Endoscopy;  Laterality: N/A;   FOREIGN BODY  REMOVAL N/A 10/17/2021   Procedure: FOREIGN BODY REMOVAL;  Surgeon: SWilford Corner MD;  Location: [facility];  Service: Endoscopy;  Laterality: N/A;   NEUROPLASTY / TRANSPOSITION ULNAR NERVE AT ELBOW Bilateral     Family History  Problem Relation Age of Onset   Hyperlipidemia Mother    Hypertension Mother    Stroke Mother    Depression Mother    Obesity Mother    Alcohol abuse Father    Obesity Father    Lung cancer Father    Obesity Brother    Hypertension Brother    Sleep apnea Brother    Breast cancer Maternal Aunt    Uterine cancer Paternal Grandmother    Colon cancer Neg Hx    Liver disease Neg Hx     Social History   Socioeconomic History   Marital status: Single    Spouse name: Not on file   Number of children: 2   Years of education: Not on file   Highest education level: Not on file  Occupational History   Occupation: sAnimal nutritionist   Employer: [employer]  Tobacco Use   Smoking status: Former    Types: Cigarettes    Quit date: 11/12/2006    Years since quitting: 15.0   Smokeless tobacco: Never   Tobacco comments:    Started smoking at age 56, quit in 2008. Smoked 2 packs a day.   Vaping Use  Vaping Use: Never used  Substance and Sexual Activity   Alcohol use: No   Drug use: No   Sexual activity: Yes    Comment: hysterectomy  Other Topics Concern   Not on file  Social History Narrative   Single,lives with 2 kids.Security at [employer]   Social Determinants of Health   Financial Resource Strain: Not on file  Food Insecurity: Not on file  Transportation Needs: Not on file  Physical Activity: Not on file  Stress: Not on file  Social Connections: Not on file  Intimate Partner Violence: Not on file    ROS Review of Systems  Constitutional: Negative.   Respiratory: Negative.    Cardiovascular: Negative.   Gastrointestinal: Negative.   Psychiatric/Behavioral: Negative.     Objective:   Today's Vitals: BP 138/82 (BP Location: Right Arm,  Cuff Size: Large)    Pulse 65    Ht '5\' 5"'  (1.651 m)    Wt 244 lb (110.7 kg)    SpO2 98%    BMI 40.60 kg/m   Physical Exam Constitutional:      General: She is not in acute distress.    Appearance: She is obese. She is not ill-appearing, toxic-appearing or diaphoretic.  Cardiovascular:     Rate and Rhythm: Normal rate and regular rhythm.     Pulses: Normal pulses.     Heart sounds: Normal heart sounds. No murmur heard.   No friction rub. No gallop.  Pulmonary:     Effort: Pulmonary effort is normal. No respiratory distress.     Breath sounds: Normal breath sounds. No stridor. No wheezing, rhonchi or rales.  Chest:     Chest wall: No tenderness.  Abdominal:     Palpations: Abdomen is soft.  Neurological:     Mental Status: She is alert.  Psychiatric:        Mood and Affect: Mood normal.        Behavior: Behavior normal.    Assessment & Plan:   Problem List Items Addressed This Visit       Other   Morbid obesity ([place])    Importance of healthy food choices with portion control discussed as well as eating regularly within 12  hour window.   The need to choose clean green food 50%-75% of time is discussed as well as make water the primary drink and set a goal for 64 ounces daily.  Patient reeducated about the importance of committment to minimum of 150 minutes of exercise per week.  Three meals at set times with snacks allowed between meals but they must be fruit or vegetable.   Aim to eat  over 12 hour period  for example 7 am to 7 pm. Stop after your last meal of the day.  Wt Readings from Last 3 Encounters:  11/22/21 244 lb (110.7 kg)  10/16/21 250 lb (113.4 kg)  06/13/21 236 lb (107 kg)        Other Visit Diagnoses     Screening due    -  Primary   Relevant Orders   Lipid Profile   TSH       Outpatient Encounter Medications as of 11/22/2021  Medication Sig   estradiol (VIVELLE-DOT) 0.1 MG/24HR patch APPLY 1 PATCH TWICE WEEKLY AS DIRECTED.   omeprazole  (PRILOSEC) 40 MG capsule Take 1 capsule by mouth 30 minutes before a meal twice a day   amoxicillin (AMOXIL) 500 MG capsule Take 2 capsules by mouth twice a day for 14 days. (Patient  not taking: Reported on 11/22/2021)   clarithromycin (BIAXIN) 500 MG tablet Take 1 tablet by mouth twice a day for 14 days (Patient not taking: Reported on 11/22/2021)   [DISCONTINUED] buPROPion (WELLBUTRIN SR) 200 MG 12 hr tablet Take 1 tablet (200 mg total) by mouth daily.   [DISCONTINUED] COVID-19 At Home Antigen Test (CARESTART COVID-19 HOME TEST) KIT Use as directed   [DISCONTINUED] estradiol (VIVELLE-DOT) 0.1 MG/24HR patch APPLY 1 PATCH TWICE WEEKLY AS DIRECTED.   [DISCONTINUED] ondansetron (ZOFRAN) 4 MG tablet Take 1 tablet (4 mg total) by mouth every 8 (eight) hours as needed for nausea or vomiting.   [DISCONTINUED] ondansetron (ZOFRAN-ODT) 4 MG disintegrating tablet Take 1 tablet (4 mg total) by mouth every 8 (eight) hours as needed for nausea or vomiting.   No facility-administered encounter medications on file as of 11/22/2021.    Follow-up: Return in about 4 months (around 03/22/2022) for annual physical exam .   Renee Rival, FNP

## 2021-11-22 NOTE — Assessment & Plan Note (Signed)
States she has been going through a lot of stress taking care of her mother and working at the same time. denies SI, HI depression. Was previously taking wellbutrin. Will refer for counseling if pt continues to deal with problem

## 2021-11-22 NOTE — Progress Notes (Deleted)
° °  Nancy Gilmore     MRN: [id number]      DOB: Apr 12, 1966   HPI Nancy Gilmore is here to establish care. Previous pt of Dr Fredric Mare.  Will be going to GI   Goes to Dr. Louretta Shorten, Physician for women in [place] for [place] needs has been doing yearly PAP , she has had total hysterectomy for ovarian cysts. Has upcoming appointment for mammogram, she has been doing test every 6 months due to having benign calcifications , scattered sub centimeter cysts. Had genetic testing done.  Pt stated that she has a lot of stress taking care of her family.    ROS Denies recent fever or chills. Denies sinus pressure, nasal congestion, ear pain or sore throat. Denies chest congestion, productive cough or wheezing. Denies chest pains, palpitations and leg swelling Denies abdominal pain, nausea, vomiting,diarrhea or constipation.   Denies dysuria, frequency, hesitancy or incontinence. Denies joint pain, swelling and limitation in mobility. Denies headaches, seizures, numbness, or tingling. Denies depression, anxiety or insomnia. Denies skin break down or rash.   PE  BP 138/82 (BP Location: Right Arm, Cuff Size: Large)    Pulse 65    Ht 5\' 5"  (1.651 m)    Wt 244 lb (110.7 kg)    SpO2 98%    BMI 40.60 kg/m   Patient alert and oriented and in no cardiopulmonary distress.  HEENT: No facial asymmetry, EOMI,     Neck supple .  Chest: Clear to auscultation bilaterally.  CVS: S1, S2 no murmurs, no S3.Regular rate.  ABD: Soft non tender.   Ext: No edema  MS: Adequate ROM spine, shoulders, hips and knees.  Skin: Intact, no ulcerations or rash noted.  Psych: Good eye contact, normal affect. Memory intact not anxious or depressed appearing.  CNS: CN 2-12 intact, power,  normal throughout.no focal deficits noted.   Assessment & Plan  ***

## 2021-11-22 NOTE — Patient Instructions (Addendum)
Please get your labs done 3-5 days before your next visit Please get your shingles vaccine at your pharmacy.   It is important that you exercise regularly at least 30 minutes 5 times a week.  Think about what you will eat, plan ahead. Choose " clean, green, fresh or frozen" over canned, processed or packaged foods which are more sugary, salty and fatty. 70 to 75% of food eaten should be vegetables and fruit. Three meals at set times with snacks allowed between meals, but they must be fruit or vegetables. Aim to eat over a 12 hour period , example 7 am to 7 pm, and STOP after  your last meal of the day. Drink water,generally about 64 ounces per day, no other drink is as healthy. Fruit juice is best enjoyed in a healthy way, by EATING the fruit.  Thanks for choosing [facility], we consider it a privelige to serve you.

## 2021-11-22 NOTE — Assessment & Plan Note (Addendum)
Importance of healthy food choices with portion control discussed as well as eating regularly within 12  hour window.   The need to choose clean green food 50%-75% of time is discussed as well as make water the primary drink and set a goal for 64 ounces daily.  Patient reeducated about the importance of committment to minimum of 150 minutes of exercise per week.  Three meals at set times with snacks allowed between meals but they must be fruit or vegetable.   Aim to eat  over 12 hour period  for example 7 am to 7 pm. Stop after your last meal of the day.   She has stopped going to weight management clinic.  Wt Readings from Last 3 Encounters:  11/22/21 244 lb (110.7 kg)  10/16/21 250 lb (113.4 kg)  06/13/21 236 lb (107 kg)  I discussed weight management using wegovy, pt states will think about this .Will start med at next visit of pt agrees to plan.    Check TSH and lipid profile.

## 2021-11-22 NOTE — Assessment & Plan Note (Signed)
Takes estradiol patch, followed by OBGYN.

## 2021-11-22 NOTE — Assessment & Plan Note (Signed)
Previously going to weight management , she has stopped due to not loosing much weight. Pt is not diabetic. Lab Results  Component Value Date   HGBA1C 5.3 06/13/2021

## 2021-11-22 NOTE — Assessment & Plan Note (Signed)
Takes omeprazole 40mg  daily

## 2021-11-22 NOTE — Assessment & Plan Note (Signed)
Followed  by GI, she will start  taking her amoxicillin and clarithromycin today.

## 2021-12-13 ENCOUNTER — Ambulatory Visit
Admission: RE | Admit: 2021-12-13 | Discharge: 2021-12-13 | Disposition: A | Discharge status: Home / Self Care | Payer: No Typology Code available for payment source | Source: Ambulatory Visit | Attending: Obstetrics and Gynecology | Admitting: Obstetrics and Gynecology

## 2021-12-13 DIAGNOSIS — R921 Mammographic calcification found on diagnostic imaging of breast: Secondary | ICD-10-CM

## 2021-12-29 ENCOUNTER — Other Ambulatory Visit (HOSPITAL_COMMUNITY): Payer: Self-pay

## 2021-12-30 ENCOUNTER — Other Ambulatory Visit (HOSPITAL_COMMUNITY): Payer: Self-pay

## 2022-01-02 ENCOUNTER — Other Ambulatory Visit (HOSPITAL_COMMUNITY): Payer: Self-pay

## 2022-01-31 ENCOUNTER — Other Ambulatory Visit (HOSPITAL_COMMUNITY): Payer: Self-pay

## 2022-01-31 MED ORDER — ESTRADIOL 0.1 MG/24HR TD PTTW
MEDICATED_PATCH | TRANSDERMAL | 3 refills | Status: DC
  Filled 2022-01-31: qty 8, 28d supply, fill #0

## 2022-02-07 ENCOUNTER — Other Ambulatory Visit (HOSPITAL_COMMUNITY): Payer: Self-pay

## 2022-02-07 MED ORDER — ESTRADIOL 0.1 MG/24HR TD PTTW
MEDICATED_PATCH | TRANSDERMAL | 12 refills | Status: DC
Start: 2022-02-07 — End: 2023-06-13
  Filled 2022-02-07 – 2022-02-26 (×2): qty 24, 84d supply, fill #0
  Filled 2022-05-18: qty 24, 84d supply, fill #1
  Filled 2022-08-12: qty 24, 84d supply, fill #2
  Filled 2022-11-06: qty 24, 84d supply, fill #3
  Filled 2023-01-29: qty 24, 84d supply, fill #4

## 2022-02-12 ENCOUNTER — Telehealth: Payer: No Typology Code available for payment source | Admitting: Physician Assistant

## 2022-02-12 DIAGNOSIS — L237 Allergic contact dermatitis due to plants, except food: Secondary | ICD-10-CM

## 2022-02-12 MED ORDER — PREDNISONE 10 MG PO TABS: ORAL_TABLET | ORAL | 0 refills | Status: DC

## 2022-02-12 NOTE — Progress Notes (Signed)
E-Visit for Poison Ivy  We are sorry that you are not feeing well.  Here is how we plan to help!  Based on what you have shared with me it looks like you have had an allergic reaction to the oily resin from a group of plants.  This resin is very sticky, so it easily attaches to your skin, clothing, tools equipment, and pet's fur.    This blistering rash is often called poison ivy rash although it can come from contact with the leaves, stems and roots of poison ivy, poison oak and poison sumac.  The oily resin contains urushiol (u-ROO-she-ol) that produces a skin rash on exposed skin.  The severity of the rash depends on the amount of urushiol that gets on your skin.  A section of skin with more urushiol on it may develop a rash sooner.  The rash usually develops 12-48 hours after exposure and can last two to three weeks.  Your skin must come in direct contact with the plant's oil to be affected.  Blister fluid doesn't spread the rash.  However, if you come into contact with a piece of clothing or pet fur that has urushiol on it, the rash may spread out.  You can also transfer the oil to other parts of your body with your fingers.  Often the rash looks like a straight line because of the way the plant brushes against your skin.  Since your rash is widespread or has resulted in a large number of blisters, I have prescribed an oral corticosteroid.  Please follow these recommendations:  I have sent a prednisone dose pack to your chosen pharmacy. Be sure to follow the instructions carefully and complete the entire prescription. You may use Benadryl or Caladryl topical lotions to sooth the itch and remember cool, not hot, showers and baths can help relieve the itching!  Place cool, wet compresses on the affected area for 15-30 minutes several times a day.  You may also take oral antihistamines, such as diphenhydramine (Benadryl, others), which may also help you sleep better.  Watch your skin for any purulent  (pus) drainage or red streaking from the site.  If this occurs, contact your provider.  You may require an antibiotic for a skin infection.  Make sure that the clothes you were wearing as well as any towels or sheets that may have come in contact with the oil (urushiol) are washed in detergent and hot water.       I have developed the following plan to treat your condition I am prescribing a two week course of steroids (37 tablets of 10 mg prednisone).  Days 1-4 take 4 tablets (40 mg) daily  Days 5-8 take 3 tablets (30 mg) daily, Days 9-11 take 2 tablets (20 mg) daily, Days 12-14 take 1 tablet (10 mg) daily.    What can you do to prevent this rash?  Avoid the plants.  Learn how to identify poison ivy, poison oak and poison sumac in all seasons.  When hiking or engaging in other activities that might expose you to these plants, try to stay on cleared pathways.  If camping, make sure you pitch your tent in an area free of these plants.  Keep pets from running through wooded areas so that urushiol doesn't accidentally stick to their fur, which you may touch.  Remove or kill the plants.  In your yard, you can get rid of poison ivy by applying an herbicide or pulling it out of   the ground, including the roots, while wearing heavy gloves.  Afterward remove the gloves and thoroughly wash them and your hands.  Don't burn poison ivy or related plants because the urushiol can be carried by smoke.  Wear protective clothing.  If needed, protect your skin by wearing socks, boots, pants, long sleeves and vinyl gloves.  Wash your skin right away.  Washing off the oil with soap and water within 30 minutes of exposure may reduce your chances of getting a poison ivy rash.  Even washing after an hour or so can help reduce the severity of the rash.  If you walk through some poison ivy and then later touch your shoes, you may get some urushiol on your hands, which may then transfer to your face or body by touching or  rubbing.  If the contaminated object isn't cleaned, the urushiol on it can still cause a skin reaction years later.    Be careful not to reuse towels after you have washed your skin.  Also carefully wash clothing in detergent and hot water to remove all traces of the oil.  Handle contaminated clothing carefully so you don't transfer the urushiol to yourself, furniture, rugs or appliances.  Remember that pets can carry the oil on their fur and paws.  If you think your pet may be contaminated with urushiol, put on some long rubber gloves and give your pet a bath.  Finally, be careful not to burn these plants as the smoke can contain traces of the oil.  Inhaling the smoke may result in difficulty breathing. If that occurred you should see a physician as soon as possible.  See your doctor right away if:  The reaction is severe or widespread You inhaled the smoke from burning poison ivy and are having difficulty breathing Your skin continues to swell The rash affects your eyes, mouth or genitals Blisters are oozing pus You develop a fever greater than 100 F (37.8 C) The rash doesn't get better within a few weeks.  If you scratch the poison ivy rash, bacteria under your fingernails may cause the skin to become infected.  See your doctor if pus starts oozing from the blisters.  Treatment generally includes antibiotics.  Poison ivy treatments are usually limited to self-care methods.  And the rash typically goes away on its own in two to three weeks.     If the rash is widespread or results in a large number of blisters, your doctor may prescribe an oral corticosteroid, such as prednisone.  If a bacterial infection has developed at the rash site, your doctor may give you a prescription for an oral antibiotic.  MAKE SURE YOU  Understand these instructions. Will watch your condition. Will get help right away if you are not doing well or get worse.   Thank you for choosing an e-visit.  Your  e-visit answers were reviewed by a board certified advanced clinical practitioner to complete your personal care plan. Depending upon the condition, your plan could have included both over the counter or prescription medications.  Please review your pharmacy choice. Make sure the pharmacy is open so you can pick up prescription now. If there is a problem, you may contact your provider through MyChart messaging and have the prescription routed to another pharmacy.  Your safety is important to us. If you have drug allergies check your prescription carefully.   For the next 24 hours you can use MyChart to ask questions about today's visit, request a non-urgent   call back, or ask for a work or school excuse. You will get an email in the next two days asking about your experience. I hope that your e-visit has been valuable and will speed your recovery.   I provided 5 minutes of non face-to-face time during this encounter for chart review and documentation.   

## 2022-02-14 ENCOUNTER — Other Ambulatory Visit (HOSPITAL_COMMUNITY): Payer: Self-pay

## 2022-02-26 ENCOUNTER — Other Ambulatory Visit (HOSPITAL_COMMUNITY): Payer: Self-pay

## 2022-03-15 ENCOUNTER — Telehealth: Payer: No Typology Code available for payment source | Admitting: Family Medicine

## 2022-03-15 DIAGNOSIS — R0981 Nasal congestion: Secondary | ICD-10-CM | POA: Diagnosis not present

## 2022-03-15 MED ORDER — FLUTICASONE PROPIONATE 50 MCG/ACT NA SUSP: 2.0000 | Freq: Every day | NASAL | 0 refills | Status: DC

## 2022-03-15 NOTE — Progress Notes (Signed)
E-Visit for Sinus Problems ? ?We are sorry that you are not feeling well.  Here is how we plan to help! ? ?Based on what you have shared with me it looks like you have sinusitis.  Sinusitis is inflammation and infection in the sinus cavities of the head.  Based on your presentation I believe you most likely have Acute Viral Sinusitis vs allergy sinus congestion.This is an infection most likely caused by a virus. There is not specific treatment for viral sinusitis other than to help you with the symptoms until the infection runs its course.  You may use an oral decongestant such as Mucinex D or if you have glaucoma or high blood pressure use plain Mucinex. Saline nasal spray help and can safely be used as often as needed for congestion, I have prescribed: Fluticasone nasal spray two sprays in each nostril once a day ? ?Some authorities believe that zinc sprays or the use of Echinacea may shorten the course of your symptoms. ? ?Sinus infections are not as easily transmitted as other respiratory infection, however we still recommend that you avoid close contact with loved ones, especially the very young and elderly.  Remember to wash your hands thoroughly throughout the day as this is the number one way to prevent the spread of infection! ? ?Home Care: ?Only take medications as instructed by your medical team. ?Do not take these medications with alcohol. ?A steam or ultrasonic humidifier can help congestion.  You can place a towel over your head and breathe in the steam from hot water coming from a faucet. ?Avoid close contacts especially the very young and the elderly. ?Cover your mouth when you cough or sneeze. ?Always remember to wash your hands. ? ?Get Help Right Away If: ?You develop worsening fever or sinus pain. ?You develop a severe head ache or visual changes. ?Your symptoms persist after you have completed your treatment plan. ? ?Make sure you ?Understand these instructions. ?Will watch your condition. ?Will  get help right away if you are not doing well or get worse. ? ? ?Thank you for choosing an e-visit. ? ?Your e-visit answers were reviewed by a board certified advanced clinical practitioner to complete your personal care plan. Depending upon the condition, your plan could have included both over the counter or prescription medications. ? ?Please review your pharmacy choice. Make sure the pharmacy is open so you can pick up prescription now. If there is a problem, you may contact your provider through [employer] and have the prescription routed to another pharmacy.  Your safety is important to [place]. If you have drug allergies check your prescription carefully.  ? ?For the next 24 hours you can use MyChart to ask questions about today's visit, request a non-urgent call back, or ask for a work or school excuse. ?You will get an email in the next two days asking about your experience. I hope that your e-visit has been valuable and will speed your recovery. ? ?I provided 5 minutes of non face-to-face time during this encounter for chart review, medication and order placement, as well as and documentation.  ? ?

## 2022-03-28 ENCOUNTER — Encounter: Payer: No Typology Code available for payment source | Admitting: Nurse Practitioner

## 2022-04-03 ENCOUNTER — Other Ambulatory Visit (HOSPITAL_COMMUNITY): Payer: Self-pay

## 2022-04-03 ENCOUNTER — Ambulatory Visit (INDEPENDENT_AMBULATORY_CARE_PROVIDER_SITE_OTHER): Payer: No Typology Code available for payment source | Admitting: Nurse Practitioner

## 2022-04-03 ENCOUNTER — Encounter: Payer: Self-pay | Admitting: Nurse Practitioner

## 2022-04-03 VITALS — BP 160/90 | HR 64 | Ht 65.0 in | Wt 250.0 lb

## 2022-04-03 DIAGNOSIS — Z23 Encounter for immunization: Secondary | ICD-10-CM

## 2022-04-03 DIAGNOSIS — Z0000 Encounter for general adult medical examination without abnormal findings: Secondary | ICD-10-CM | POA: Diagnosis not present

## 2022-04-03 DIAGNOSIS — R030 Elevated blood-pressure reading, without diagnosis of hypertension: Secondary | ICD-10-CM | POA: Diagnosis not present

## 2022-04-03 DIAGNOSIS — N281 Cyst of kidney, acquired: Secondary | ICD-10-CM | POA: Diagnosis not present

## 2022-04-03 MED ORDER — SEMAGLUTIDE-WEIGHT MANAGEMENT 0.5 MG/0.5ML ~~LOC~~ SOAJ
0.5000 mg | SUBCUTANEOUS | 0 refills | Status: AC
Start: 2022-05-02 — End: 2022-06-13
  Filled 2022-04-03 – 2022-05-11 (×2): qty 2, 28d supply, fill #0

## 2022-04-03 MED ORDER — SEMAGLUTIDE-WEIGHT MANAGEMENT 0.25 MG/0.5ML ~~LOC~~ SOAJ
0.2500 mg | SUBCUTANEOUS | 0 refills | Status: DC
  Filled 2022-04-03 – 2022-04-06 (×3): qty 2, 28d supply, fill #0

## 2022-04-03 NOTE — Assessment & Plan Note (Addendum)
Incidental finding on Abdominal CT   There is a 3.5 cm left renal inferior pole cyst. The visualized ureters appear unremarkable. The urinary bladder is collapsed.  Patient referred to nephrologist for further evaluation

## 2022-04-03 NOTE — Progress Notes (Signed)
Complete physical exam  Patient: Nancy Gilmore   DOB: 05-Feb-1966   57 y.o. Female  MRN: [id number]  Subjective:    Chief Complaint  Patient presents with   Annual Exam    cpe    Nancy Gilmore is a 56 y.o. female with past medical history of GERD, insulin resistance who presents today for a complete physical exam. She reports consuming a low sodium diet. She mown the yard, stays busy running around doing things, stays busy on her feet all days at work , other than that no regular exercises .  She generally feels well. She reports sleeping well. She does have additional problems to discuss today.   Had endoscopy done on 03/29/2022 at [facility] for esophageal stricture. States that the physicians have not updated her with the results yet. Reports that the procedure went well, denies trouble swallowing. Now taking protonix 79m daily.    Pt discussed that there was a renal cyst found incidentally in a CT that was done in Dec 2022, she would like a referral to nephrologist about this , pt denies urinary frequency , hesitancy, edema .   Wears prescription glasses has had an eye exam within the past one year.    Most recent fall risk assessment:    04/03/2022    8:06 AM  [place]  Number falls in past yr: 0  Injury with Fall? 0  Risk for fall due to : No Fall Risks  Follow up Falls evaluation completed     Most recent depression screenings:    04/03/2022    8:06 AM 11/22/2021   11:04 AM  PHQ 2/9 Scores  PHQ - 2 Score 0 0        Patient Care Team: PRenee Rival FNP as PCP - General (Nurse Practitioner)   Outpatient Medications Prior to Visit  Medication Sig   estradiol (VIVELLE-DOT) 0.1 MG/24HR patch APPLY 1 PATCH TWICE WEEKLY AS DIRECTED.   fluticasone (FLONASE) 50 MCG/ACT nasal spray Place 2 sprays into both nostrils daily.   omeprazole (PRILOSEC) 40 MG capsule Take 1 capsule by mouth 30 minutes before a meal twice a day    estradiol (VIVELLE-DOT) 0.1 MG/24HR patch APPLY 1 PATCH TWICE WEEKLY AS DIRECTED.   [DISCONTINUED] predniSONE (DELTASONE) 10 MG tablet Days 1-4 take 4 tablets (40 mg) daily  Days 5-8 take 3 tablets (30 mg) daily, Days 9-11 take 2 tablets (20 mg) daily, Days 12-14 take 1 tablet (10 mg) daily.   No facility-administered medications prior to visit.    Review of Systems  Constitutional: Negative.   HENT: Negative.    Eyes: Negative.   Respiratory: Negative.    Cardiovascular: Negative.   Gastrointestinal: Negative.   Genitourinary: Negative.   Musculoskeletal: Negative.   Skin: Negative.   Neurological: Negative.   Endo/Heme/Allergies: Negative.   Psychiatric/Behavioral: Negative.           Objective:     BP (!) 160/90   Pulse 64   Ht _0  (1.651 m)   Wt 250 lb (113.4 kg)   SpO2 95%   BMI 41.60 kg/m    Physical Exam Constitutional:      General: She is not in acute distress.    Appearance: Normal appearance. She is obese. She is not ill-appearing, toxic-appearing or diaphoretic.  HENT:     Head: Normocephalic and atraumatic.     Right Ear: Tympanic membrane, ear canal and external  ear normal. There is no impacted cerumen.     Left Ear: Tympanic membrane, ear canal and external ear normal. There is no impacted cerumen.     Nose: Nose normal. No congestion or rhinorrhea.     Mouth/Throat:     Mouth: Mucous membranes are moist.     Pharynx: Oropharynx is clear. No oropharyngeal exudate or posterior oropharyngeal erythema.  Eyes:     General: No scleral icterus.       Right eye: No discharge.        Left eye: No discharge.     Extraocular Movements: Extraocular movements intact.     Conjunctiva/sclera: Conjunctivae normal.     Pupils: Pupils are equal, round, and reactive to light.  Neck:     Vascular: No carotid bruit.  Cardiovascular:     Rate and Rhythm: Normal rate and regular rhythm.     Heart sounds: No murmur heard.   No friction rub. No gallop.   Pulmonary:     Effort: Pulmonary effort is normal. No respiratory distress.     Breath sounds: Normal breath sounds. No stridor. No wheezing, rhonchi or rales.  Chest:     Chest wall: No tenderness.  Abdominal:     General: There is no distension.     Palpations: There is no mass.     Tenderness: There is no abdominal tenderness. There is no right CVA tenderness, left CVA tenderness, guarding or rebound.     Hernia: No hernia is present.  Musculoskeletal:        General: No swelling, tenderness, deformity or signs of injury.     Cervical back: Normal range of motion and neck supple. No rigidity or tenderness.     Right lower leg: No edema.     Left lower leg: No edema.  Lymphadenopathy:     Cervical: No cervical adenopathy.  Skin:    General: Skin is warm and dry.     Capillary Refill: Capillary refill takes less than 2 seconds.     Coloration: Skin is not jaundiced or pale.     Findings: No bruising, erythema, lesion or rash.  Neurological:     Mental Status: She is alert and oriented to person, place, and time.     Cranial Nerves: No cranial nerve deficit.     Sensory: No sensory deficit.     Motor: No weakness.     Coordination: Coordination normal.     Gait: Gait normal.     Deep Tendon Reflexes: Reflexes normal.  Psychiatric:        Mood and Affect: Mood normal.        Behavior: Behavior normal.        Thought Content: Thought content normal.        Judgment: Judgment normal.  Patient declined breast exam today  No results found for any visits on 04/03/22.     Assessment & Plan:    Routine Health Maintenance and Physical Exam  Immunization History  Administered Date(s) Administered   Influenza-Unspecified 08/13/2019, 08/12/2020, 09/01/2020, 08/31/2021   Moderna SARS-COV2 Booster Vaccination 04/08/2021   Moderna Sars-Covid-2 Vaccination 02/29/2020, 03/30/2020   Td 11/13/2015   Tdap 10/24/1995   Zoster Recombinat (Shingrix) 04/03/2022    Health Maintenance   Topic Date Due   Zoster Vaccines- Shingrix (2 of 2) 05/29/2022   INFLUENZA VACCINE  06/12/2022   PAP SMEAR-Modifier  10/12/2022   MAMMOGRAM  12/14/2023   Fecal DNA (Cologuard)  02/04/2024   TETANUS/TDAP  11/12/2025  Hepatitis C Screening  Completed   HIV Screening  Completed   HPV VACCINES  Aged Out   COVID-19 Vaccine  Discontinued    Discussed health benefits of physical activity, and encouraged her to engage in regular exercise appropriate for her age and condition.  Problem List Items Addressed This Visit       Genitourinary   Renal cyst    Incidental finding on Abdominal CT   There is a 3.5 cm left renal inferior pole cyst. The visualized ureters appear unremarkable. The urinary bladder is collapsed.  Patient referred to nephrologist for further evaluation       Relevant Orders   Ambulatory referral to Nephrology     Other   Morbid obesity ([place])    Wt Readings from Last 3 Encounters:  04/03/22 250 lb (113.4 kg)  11/22/21 244 lb (110.7 kg)  10/16/21 250 lb (113.4 kg)  Chronic condition as added 6 pounds since last visit, does not do regular intentional exercise. Need to increase intake of whole foods consisting mainly vegetables and protein, less carbohydrates drinking at least 64 ounces of water daily, engaging in regular moderate exercises at least 150 minutes weekly, importance of portion control also discussed with patient Start Wegovy 0.25 mg once weekly injection for 4 weeks, side effects of medication discussed with patient, follow-up in 5 weeks      Relevant Medications   Semaglutide-Weight Management 0.25 MG/0.5ML SOAJ   Semaglutide-Weight Management 0.5 MG/0.5ML SOAJ (Start on 05/02/2022)   Elevated blood pressure reading without diagnosis of hypertension    BP Readings from Last 3 Encounters:  04/03/22 (!) 160/90  11/22/21 138/82  10/17/21 (!) 123/51  Blood pressure elevated in the office today never been diagnosed with hypertension DASH diet  advised, engage in moderate exercises at least 150 minutes weekly Patient encouraged to monitor her blood pressure at home daily keep a log and bring BP readings to her next appointment in 5 weeks.      Annual physical exam - Primary    Annual exam as documented.  Counseling done include healthy lifestyle involving committing to 150 minutes of exercise per week, heart healthy diet, and attaining healthy weight. The importance of adequate sleep also discussed.  Regular use of seat belt and home safety were also discussed . Changes in health habits are decided on by patient with goals and time frames set for achieving them. Immunization and cancer screening  needs are specifically addressed at this visit.  Shingles vaccine given today        Relevant Orders   Lipid Profile   HgB A1c   Vitamin D (25 hydroxy)   TSH   CMP14+EGFR   CBC with Differential   Return in about 5 weeks (around 05/08/2022) for obesity and HTN .     Renee Rival, FNP

## 2022-04-03 NOTE — Patient Instructions (Addendum)
Schedule for wegovy injection   Inject 0.25 mg into the skin once a week for 28 days., Starting Tue 04/03/2022, Until Tue 05/01/2022, Normal   Inject 0.5 mg into the skin once a week for 28 days., Starting Wed 05/02/2022, Until Wed 05/30/2022  Please check your blood pressure daily at home, write down the numbers and bring to your next appointment. BP goal is less than 140/90   It is important that you exercise regularly at least 30 minutes 5 times a week.  Think about what you will eat, plan ahead. Choose " clean, green, fresh or frozen" over canned, processed or packaged foods which are more sugary, salty and fatty. 70 to 75% of food eaten should be vegetables and fruit. Three meals at set times with snacks allowed between meals, but they must be fruit or vegetables. Aim to eat over a 12 hour period , example 7 am to 7 pm, and STOP after  your last meal of the day. Drink water,generally about 64 ounces per day, no other drink is as healthy. Fruit juice is best enjoyed in a healthy way, by EATING the fruit.  Thanks for choosing [facility], we consider it a privelige to serve you.

## 2022-04-03 NOTE — Assessment & Plan Note (Signed)
BP Readings from Last 3 Encounters:  04/03/22 (!) 160/90  11/22/21 138/82  10/17/21 (!) 123/51  Blood pressure elevated in the office today never been diagnosed with hypertension DASH diet advised, engage in moderate exercises at least 150 minutes weekly Patient encouraged to monitor her blood pressure at home daily keep a log and bring BP readings to her next appointment in 5 weeks.

## 2022-04-03 NOTE — Assessment & Plan Note (Signed)
Annual exam as documented.  Counseling done include healthy lifestyle involving committing to 150 minutes of exercise per week, heart healthy diet, and attaining healthy weight. The importance of adequate sleep also discussed.  Regular use of seat belt and home safety were also discussed . Changes in health habits are decided on by patient with goals and time frames set for achieving them. Immunization and cancer screening  needs are specifically addressed at this visit.  Shingles vaccine given today

## 2022-04-03 NOTE — Assessment & Plan Note (Signed)
Wt Readings from Last 3 Encounters:  04/03/22 250 lb (113.4 kg)  11/22/21 244 lb (110.7 kg)  10/16/21 250 lb (113.4 kg)  Chronic condition as added 6 pounds since last visit, does not do regular intentional exercise. Need to increase intake of whole foods consisting mainly vegetables and protein, less carbohydrates drinking at least 64 ounces of water daily, engaging in regular moderate exercises at least 150 minutes weekly, importance of portion control also discussed with patient Start Wegovy 0.25 mg once weekly injection for 4 weeks, side effects of medication discussed with patient, follow-up in 5 weeks

## 2022-04-04 LAB — CBC WITH DIFFERENTIAL/PLATELET
Basophils Absolute: 0.1 10*3/uL (ref 0.0–0.2)
Basos: 1 %
EOS (ABSOLUTE): 0.2 10*3/uL (ref 0.0–0.4)
Eos: 3 %
Hematocrit: 38.8 % (ref 34.0–46.6)
Hemoglobin: 13.4 g/dL (ref 11.1–15.9)
Immature Grans (Abs): 0 10*3/uL (ref 0.0–0.1)
Immature Granulocytes: 0 %
Lymphocytes Absolute: 1.8 10*3/uL (ref 0.7–3.1)
Lymphs: 31 %
MCH: 31.3 pg (ref 26.6–33.0)
MCHC: 34.5 g/dL (ref 31.5–35.7)
MCV: 91 fL (ref 79–97)
Monocytes Absolute: 0.6 10*3/uL (ref 0.1–0.9)
Monocytes: 10 %
Neutrophils Absolute: 3.2 10*3/uL (ref 1.4–7.0)
Neutrophils: 55 %
Platelets: 247 10*3/uL (ref 150–450)
RBC: 4.28 x10E6/uL (ref 3.77–5.28)
RDW: 12.8 % (ref 11.7–15.4)
WBC: 5.7 10*3/uL (ref 3.4–10.8)

## 2022-04-04 LAB — LIPID PANEL
Chol/HDL Ratio: 2.6 ratio (ref 0.0–4.4)
Cholesterol, Total: 174 mg/dL (ref 100–199)
HDL: 68 mg/dL (ref >39)
LDL Chol Calc (NIH): 91 mg/dL (ref 0–99)
Triglycerides: 82 mg/dL (ref 0–149)
VLDL Cholesterol Cal: 15 mg/dL (ref 5–40)

## 2022-04-04 LAB — CMP14+EGFR
ALT: 28 IU/L (ref 0–32)
AST: 25 IU/L (ref 0–40)
Albumin/Globulin Ratio: 2 (ref 1.2–2.2)
Albumin: 4.6 g/dL (ref 3.8–4.9)
Alkaline Phosphatase: 56 IU/L (ref 44–121)
BUN/Creatinine Ratio: 17 (ref 9–23)
BUN: 12 mg/dL (ref 6–24)
Bilirubin Total: 0.4 mg/dL (ref 0.0–1.2)
CO2: 24 mmol/L (ref 20–29)
Calcium: 9.4 mg/dL (ref 8.7–10.2)
Chloride: 101 mmol/L (ref 96–106)
Creatinine, Ser: 0.71 mg/dL (ref 0.57–1.00)
Globulin, Total: 2.3 g/dL (ref 1.5–4.5)
Glucose: 91 mg/dL (ref 70–99)
Potassium: 4.3 mmol/L (ref 3.5–5.2)
Sodium: 139 mmol/L (ref 134–144)
Total Protein: 6.9 g/dL (ref 6.0–8.5)
eGFR: 100 mL/min/{1.73_m2} (ref >59)

## 2022-04-04 LAB — TSH: TSH: 1.48 u[IU]/mL (ref 0.450–4.500)

## 2022-04-04 LAB — HEMOGLOBIN A1C
Est. average glucose Bld gHb Est-mCnc: 108 mg/dL
Hgb A1c MFr Bld: 5.4 % (ref 4.8–5.6)

## 2022-04-04 LAB — VITAMIN D 25 HYDROXY (VIT D DEFICIENCY, FRACTURES): Vit D, 25-Hydroxy: 39.9 ng/mL (ref 30.0–100.0)

## 2022-04-06 ENCOUNTER — Encounter (HOSPITAL_BASED_OUTPATIENT_CLINIC_OR_DEPARTMENT_OTHER): Payer: Self-pay

## 2022-04-06 ENCOUNTER — Other Ambulatory Visit (HOSPITAL_COMMUNITY): Payer: Self-pay

## 2022-04-06 ENCOUNTER — Other Ambulatory Visit (HOSPITAL_BASED_OUTPATIENT_CLINIC_OR_DEPARTMENT_OTHER): Payer: Self-pay

## 2022-04-10 ENCOUNTER — Other Ambulatory Visit (HOSPITAL_BASED_OUTPATIENT_CLINIC_OR_DEPARTMENT_OTHER): Payer: Self-pay

## 2022-04-17 ENCOUNTER — Other Ambulatory Visit (HOSPITAL_BASED_OUTPATIENT_CLINIC_OR_DEPARTMENT_OTHER): Payer: Self-pay

## 2022-04-19 ENCOUNTER — Other Ambulatory Visit (HOSPITAL_COMMUNITY): Payer: Self-pay

## 2022-04-24 ENCOUNTER — Other Ambulatory Visit (HOSPITAL_COMMUNITY): Payer: Self-pay

## 2022-04-24 ENCOUNTER — Other Ambulatory Visit: Payer: Self-pay | Admitting: Nurse Practitioner

## 2022-04-24 MED ORDER — WEGOVY 0.25 MG/0.5ML ~~LOC~~ SOAJ
0.2500 mg | SUBCUTANEOUS | 0 refills | Status: AC
  Filled 2022-04-24: qty 2, 28d supply, fill #0

## 2022-04-28 ENCOUNTER — Other Ambulatory Visit (HOSPITAL_COMMUNITY): Payer: Self-pay

## 2022-05-09 ENCOUNTER — Ambulatory Visit: Payer: No Typology Code available for payment source | Admitting: Nurse Practitioner

## 2022-05-11 ENCOUNTER — Encounter: Payer: Self-pay | Admitting: Nurse Practitioner

## 2022-05-11 ENCOUNTER — Ambulatory Visit (INDEPENDENT_AMBULATORY_CARE_PROVIDER_SITE_OTHER): Payer: No Typology Code available for payment source | Admitting: Nurse Practitioner

## 2022-05-11 ENCOUNTER — Other Ambulatory Visit (HOSPITAL_COMMUNITY): Payer: Self-pay

## 2022-05-11 DIAGNOSIS — N281 Cyst of kidney, acquired: Secondary | ICD-10-CM | POA: Diagnosis not present

## 2022-05-11 DIAGNOSIS — I10 Essential (primary) hypertension: Secondary | ICD-10-CM | POA: Diagnosis not present

## 2022-05-11 DIAGNOSIS — Z6841 Body Mass Index (BMI) 40.0 and over, adult: Secondary | ICD-10-CM

## 2022-05-11 MED ORDER — WEGOVY 1.7 MG/0.75ML ~~LOC~~ SOAJ
1.7000 mg | SUBCUTANEOUS | 0 refills | Status: DC
  Filled 2022-05-11 – 2022-07-03 (×2): qty 3, 28d supply, fill #0

## 2022-05-11 MED ORDER — AMLODIPINE BESYLATE 2.5 MG PO TABS
2.5000 mg | ORAL_TABLET | Freq: Every day | ORAL | 0 refills | Status: DC
  Filled 2022-05-11 – 2022-05-18 (×2): qty 90, 90d supply, fill #0

## 2022-05-11 MED ORDER — WEGOVY 1 MG/0.5ML ~~LOC~~ SOAJ
1.0000 mg | SUBCUTANEOUS | 0 refills | Status: DC
  Filled 2022-05-11 – 2022-06-14 (×2): qty 2, 28d supply, fill #0

## 2022-05-11 NOTE — Assessment & Plan Note (Addendum)
BP Readings from Last 3 Encounters:  05/11/22 140/90  04/03/22 (!) 160/90  11/22/21 138/82  Blood pressure remains elevated in the office today. EKG done in 2021 shows sinus bradycardia, HR 59. Blood pressure readings at home has been mostly in the 130s over 70s. Start amlodipine 2.5 mg daily DASH diet advised patient encouraged to engage in regular moderate exercises at least for 50 minutes weekly Follow-up in 4 weeks

## 2022-05-11 NOTE — Patient Instructions (Addendum)
Once you complete the current does of wegopvy 0.'5mg'$  once weekly injection, Please start taking wegovy '1mg'$  once weekly injection for 4 weeks After that move to wegovy 1.'7mg'$  once weekly injection for another 4 weeks    Please start taking amlodipine 2.'5mg'$   daily for your blood pressure   It is important that you exercise regularly at least 30 minutes 5 times a week.  Think about what you will eat, plan ahead. Choose " clean, green, fresh or frozen" over canned, processed or packaged foods which are more sugary, salty and fatty. 70 to 75% of food eaten should be vegetables and fruit. Three meals at set times with snacks allowed between meals, but they must be fruit or vegetables. Aim to eat over a 12 hour period , example 7 am to 7 pm, and STOP after  your last meal of the day. Drink water,generally about 64 ounces per day, no other drink is as healthy. Fruit juice is best enjoyed in a healthy way, by EATING the fruit.  Thanks for choosing [facility], we consider it a privelige to serve you.

## 2022-05-11 NOTE — Assessment & Plan Note (Addendum)
Wt Readings from Last 3 Encounters:  05/11/22 242 lb (109.8 kg)  04/03/22 250 lb (113.4 kg)  11/22/21 244 lb (110.7 kg)  She has lost 8 pounds since her last visit Currently on Wegovy 0.5 mg once weekly injection Patient congratulated for losing 8 pounds She was encouraged to continue to engage in regular moderate exercises at least for 50 minutes weekly, patient counseled on low-carb diet. Start Wegovy 1 mg for 4 weeks after completing Wegovy 0.5 mg After completion of 1 mg for 4 weeks start Wegovy 1.7 milligrams weekly for another 4 weeks.

## 2022-05-11 NOTE — Assessment & Plan Note (Signed)
3.5 cm left renal inferior pole cyst noted on abdominal CT Referral sent to nephrology at her last visit but she has not heard from them. Referral sent again today

## 2022-05-11 NOTE — Progress Notes (Signed)
   Nancy Gilmore     MRN: [id number]      DOB: October 05, 1966   HPI Nancy Gilmore with past medical history of GERD, redness states, morbid obesity elevated blood pressure is here for follow up for elevated blood pressure and obesity  Obesity .  Patient currently on Wegovy 0.5 mg once weekly injection , she stated that had some nausea when she started taking wegovy but not anymore. she has been doing some yoga, does a lot of walking on the job. She has cut down on sweets ,   Hypertension.  Patient has been monitoring her blood pressure at home as advised blood pressure mostly in the 130s over 70s, she denies headache, dizziness, edema, chest pain.    ROS Denies recent fever or chills. Denies sinus pressure, nasal congestion, ear pain or sore throat. Denies chest congestion, productive cough or wheezing. Denies chest pains, palpitations and leg swelling Denies abdominal pain, nausea, vomiting,diarrhea or constipation.   Denies dysuria, frequency, hesitancy or incontinence. Denies joint pain, swelling and limitation in mobility. Denies headaches, seizures, numbness, or tingling. Denies depression, anxiety or insomnia.    PE  BP 140/90   Pulse 61   Ht '5\' 5"'$  (1.651 m)   Wt 242 lb (109.8 kg)   SpO2 97%   BMI 40.27 kg/m   Patient alert and oriented and in no cardiopulmonary distress.  Chest: Clear to auscultation bilaterally.  CVS: S1, S2 no murmurs, no S3.Regular rate.  ABD: Soft non tender.   Ext: No edema  MS: Adequate ROM spine, shoulders, hips and knees.  Skin: Intact, no ulcerations or rash noted.  Psych: Good eye contact, normal affect. Memory intact not anxious or depressed appearing.    Assessment & Plan  Class 3 severe obesity with serious comorbidity and body mass index (BMI) of 40.0 to 44.9 in adult ([place]) Wt Readings from Last 3 Encounters:  05/11/22 242 lb (109.8 kg)  04/03/22 250 lb (113.4 kg)  11/22/21 244 lb (110.7 kg)  She has lost 8 pounds since her  last visit Currently on Wegovy 0.5 mg once weekly injection Patient congratulated for losing 8 pounds She was encouraged to continue to engage in regular moderate exercises at least for 50 minutes weekly, patient counseled on low-carb diet. Start Wegovy 1 mg for 4 weeks after completing Wegovy 0.5 mg After completion of 1 mg for 4 weeks start Wegovy 1.7 milligrams weekly for another 4 weeks.  Hypertension BP Readings from Last 3 Encounters:  05/11/22 140/90  04/03/22 (!) 160/90  11/22/21 138/82  Blood pressure remains elevated in the office today. EKG done in 2021 shows sinus bradycardia, HR 59. Blood pressure readings at home has been mostly in the 130s over 70s. Start amlodipine 2.5 mg daily DASH diet advised patient encouraged to engage in regular moderate exercises at least for 50 minutes weekly Follow-up in 4 weeks  Renal cyst 3.5 cm left renal inferior pole cyst noted on abdominal CT Referral sent to nephrology at her last visit but she has not heard from them. Referral sent again today

## 2022-05-16 ENCOUNTER — Other Ambulatory Visit (HOSPITAL_COMMUNITY): Payer: Self-pay

## 2022-05-18 ENCOUNTER — Other Ambulatory Visit (HOSPITAL_COMMUNITY): Payer: Self-pay

## 2022-06-13 ENCOUNTER — Encounter: Payer: Self-pay | Admitting: Nurse Practitioner

## 2022-06-13 ENCOUNTER — Ambulatory Visit (INDEPENDENT_AMBULATORY_CARE_PROVIDER_SITE_OTHER): Payer: No Typology Code available for payment source | Admitting: Nurse Practitioner

## 2022-06-13 VITALS — BP 142/82 | HR 65 | Ht 65.0 in | Wt 238.0 lb

## 2022-06-13 DIAGNOSIS — I10 Essential (primary) hypertension: Secondary | ICD-10-CM | POA: Diagnosis not present

## 2022-06-13 DIAGNOSIS — Z6841 Body Mass Index (BMI) 40.0 and over, adult: Secondary | ICD-10-CM | POA: Diagnosis not present

## 2022-06-13 NOTE — Progress Notes (Signed)
   Nancy Gilmore     MRN: [id number]      DOB: January 24, 1966   HPI [name] with past medical history of hypertension, GERD, morbid obesity is here for follow up for hypertension.  States that she has been checking her BP at home states that the readings has been in the 120/70.  She is still taking the amlodipine 2.5 mg about 2 weeks ago.  She denies headache, dizziness, edema  Due for second dose of shingles vaccine, patient not ready for flu vaccine today she plans coming back to get the vaccine      ROS Denies recent fever or chills. Denies sinus pressure, nasal congestion, ear pain or sore throat. Denies chest congestion, productive cough or wheezing. Denies chest pains, palpitations and leg swelling Denies abdominal pain, nausea, vomiting,diarrhea or constipation.   Denies dysuria, frequency, hesitancy or incontinence. Denies joint pain, swelling and limitation in mobility. Denies headaches, seizures, numbness, or tingling. Denies depression, anxiety or insomnia.    PE  BP 124/82 (BP Location: Left Arm, Cuff Size: Large)   Pulse 65   Ht '5\' 5"'$  (1.651 m)   Wt 238 lb (108 kg)   SpO2 96%   BMI 39.61 kg/m   Patient alert and oriented and in no cardiopulmonary distress.  Chest: Clear to auscultation bilaterally.  CVS: S1, S2 no murmurs, no S3.Regular rate.  ABD: Soft non tender.   Ext: No edema  MS: Adequate ROM spine, shoulders, hips and knees.   Psych: Good eye contact, normal affect. Memory intact not anxious or depressed appearing.  CNS: CN 2-12 intact, power,  normal throughout.no focal deficits noted.   Assessment & Plan Hypertension BP Readings from Last 3 Encounters:  06/13/22 (!) 142/82  05/11/22 140/90  04/03/22 (!) 160/90  Chronic uncontrolled condition, BP readings elevated on the right arm at 142/82 left arm was 132/80. Patient encouraged to start taking amlodipine 2.5 mg daily DASH diet advised to engage in regular moderate exercise at least  more 50 minutes weekly as tolerated Follow-up in 3 months  Class 3 severe obesity with serious comorbidity and body mass index (BMI) of 40.0 to 44.9 in adult ([place]) Wt Readings from Last 3 Encounters:  06/13/22 238 lb (108 kg)  05/11/22 242 lb (109.8 kg)  04/03/22 250 lb (113.4 kg)  She has lost 4 pounds since last visit Currently on Wegovy 1 mg once weekly injection Need to increase intake of whole food consisting mainly veggies and protein she has carbohydrate engaging regular exercises at least 150 minutes weekly discussed. Follow-up in 3

## 2022-06-13 NOTE — Assessment & Plan Note (Signed)
Wt Readings from Last 3 Encounters:  06/13/22 238 lb (108 kg)  05/11/22 242 lb (109.8 kg)  04/03/22 250 lb (113.4 kg)  She has lost 4 pounds since last visit Currently on Wegovy 1 mg once weekly injection Need to increase intake of whole food consisting mainly veggies and protein she has carbohydrate engaging regular exercises at least 150 minutes weekly discussed. Follow-up in 3

## 2022-06-13 NOTE — Patient Instructions (Signed)
Please continue to take amlodipine 2.'5mg'$  daily as discussed    It is important that you exercise regularly at least 30 minutes 5 times a week.  Think about what you will eat, plan ahead. Choose " clean, green, fresh or frozen" over canned, processed or packaged foods which are more sugary, salty and fatty. 70 to 75% of food eaten should be vegetables and fruit. Three meals at set times with snacks allowed between meals, but they must be fruit or vegetables. Aim to eat over a 12 hour period , example 7 am to 7 pm, and STOP after  your last meal of the day. Drink water,generally about 64 ounces per day, no other drink is as healthy. Fruit juice is best enjoyed in a healthy way, by EATING the fruit.  Thanks for choosing [facility], we consider it a privelige to serve you.

## 2022-06-13 NOTE — Assessment & Plan Note (Signed)
BP Readings from Last 3 Encounters:  06/13/22 (!) 142/82  05/11/22 140/90  04/03/22 (!) 160/90  Chronic uncontrolled condition, BP readings elevated on the right arm at 142/82 left arm was 132/80. Patient encouraged to start taking amlodipine 2.5 mg daily DASH diet advised to engage in regular moderate exercise at least more 50 minutes weekly as tolerated Follow-up in 3 months

## 2022-06-14 ENCOUNTER — Other Ambulatory Visit (HOSPITAL_COMMUNITY): Payer: Self-pay

## 2022-06-20 ENCOUNTER — Encounter (INDEPENDENT_AMBULATORY_CARE_PROVIDER_SITE_OTHER): Payer: Self-pay

## 2022-06-20 ENCOUNTER — Other Ambulatory Visit (HOSPITAL_COMMUNITY): Payer: Self-pay

## 2022-07-03 ENCOUNTER — Other Ambulatory Visit (HOSPITAL_COMMUNITY): Payer: Self-pay

## 2022-07-14 ENCOUNTER — Other Ambulatory Visit (HOSPITAL_COMMUNITY): Payer: Self-pay

## 2022-07-17 ENCOUNTER — Other Ambulatory Visit (HOSPITAL_COMMUNITY): Payer: Self-pay

## 2022-07-25 ENCOUNTER — Other Ambulatory Visit: Payer: Self-pay | Admitting: Nephrology

## 2022-07-25 ENCOUNTER — Other Ambulatory Visit (HOSPITAL_COMMUNITY): Payer: Self-pay | Admitting: Nephrology

## 2022-07-25 DIAGNOSIS — I129 Hypertensive chronic kidney disease with stage 1 through stage 4 chronic kidney disease, or unspecified chronic kidney disease: Secondary | ICD-10-CM

## 2022-07-25 DIAGNOSIS — N281 Cyst of kidney, acquired: Secondary | ICD-10-CM

## 2022-07-31 ENCOUNTER — Other Ambulatory Visit: Payer: Self-pay | Admitting: Nurse Practitioner

## 2022-08-01 ENCOUNTER — Other Ambulatory Visit (HOSPITAL_COMMUNITY): Payer: Self-pay

## 2022-08-01 MED ORDER — WEGOVY 1.7 MG/0.75ML ~~LOC~~ SOAJ
1.7000 mg | SUBCUTANEOUS | 0 refills | Status: DC
  Filled 2022-08-01: qty 3, 28d supply, fill #0

## 2022-08-07 ENCOUNTER — Ambulatory Visit (HOSPITAL_BASED_OUTPATIENT_CLINIC_OR_DEPARTMENT_OTHER)
Admission: RE | Admit: 2022-08-07 | Discharge: 2022-08-07 | Disposition: A | Discharge status: Home / Self Care | Payer: No Typology Code available for payment source | Source: Ambulatory Visit | Attending: Nephrology | Admitting: Nephrology

## 2022-08-07 DIAGNOSIS — I129 Hypertensive chronic kidney disease with stage 1 through stage 4 chronic kidney disease, or unspecified chronic kidney disease: Secondary | ICD-10-CM

## 2022-08-07 DIAGNOSIS — N281 Cyst of kidney, acquired: Secondary | ICD-10-CM | POA: Diagnosis present

## 2022-08-12 ENCOUNTER — Other Ambulatory Visit: Payer: Self-pay | Admitting: Nurse Practitioner

## 2022-08-12 DIAGNOSIS — I10 Essential (primary) hypertension: Secondary | ICD-10-CM

## 2022-08-13 ENCOUNTER — Other Ambulatory Visit (HOSPITAL_COMMUNITY): Payer: Self-pay

## 2022-08-13 MED ORDER — AMLODIPINE BESYLATE 2.5 MG PO TABS
2.5000 mg | ORAL_TABLET | Freq: Every day | ORAL | 0 refills | Status: DC
  Filled 2022-08-13: qty 90, 90d supply, fill #0

## 2022-08-14 ENCOUNTER — Other Ambulatory Visit (HOSPITAL_COMMUNITY): Payer: Self-pay

## 2022-09-13 ENCOUNTER — Ambulatory Visit (INDEPENDENT_AMBULATORY_CARE_PROVIDER_SITE_OTHER): Payer: No Typology Code available for payment source | Admitting: Internal Medicine

## 2022-09-13 ENCOUNTER — Encounter: Payer: Self-pay | Admitting: Internal Medicine

## 2022-09-13 ENCOUNTER — Other Ambulatory Visit (HOSPITAL_COMMUNITY): Payer: Self-pay

## 2022-09-13 VITALS — BP 136/75 | HR 67 | Ht 65.0 in | Wt 228.8 lb

## 2022-09-13 DIAGNOSIS — I10 Essential (primary) hypertension: Secondary | ICD-10-CM | POA: Diagnosis not present

## 2022-09-13 DIAGNOSIS — Z23 Encounter for immunization: Secondary | ICD-10-CM

## 2022-09-13 MED ORDER — AMLODIPINE BESYLATE 5 MG PO TABS
5.0000 mg | ORAL_TABLET | Freq: Every day | ORAL | 1 refills | Status: DC
  Filled 2022-09-13: qty 90, 90d supply, fill #0
  Filled 2022-12-20: qty 90, 90d supply, fill #1

## 2022-09-13 MED ORDER — TELMISARTAN 20 MG PO TABS
10.0000 mg | ORAL_TABLET | Freq: Every day | ORAL | 3 refills | Status: DC
  Filled 2022-09-17: qty 45, 90d supply, fill #0
  Filled 2022-11-08: qty 45, 90d supply, fill #1

## 2022-09-13 MED ORDER — WEGOVY 1.7 MG/0.75ML ~~LOC~~ SOAJ
1.7000 mg | SUBCUTANEOUS | 0 refills | Status: DC
  Filled 2022-09-13: qty 3, 28d supply, fill #0

## 2022-09-13 NOTE — Progress Notes (Signed)
Established Patient Office Visit  Subjective   Patient ID: Nancy Gilmore, female    DOB: 01/29/1966  Age: 56 y.o. MRN: [id number]  Chief Complaint  Patient presents with   Follow-up   Nancy Gilmore returns to care today.  She is a 56 year old woman with a past medical history significant for HTN, CKD, GERD, morbid obesity.  She was last seen at [facility] by Vena Rua, NP on 06/13/2022.  At that time she was encouraged to start taking amlodipine 2.5 mg daily.  45-monthfollow-up arranged.  There have been no acute interval events.  Today Nancy Gilmore states that she feels well.  She is asymptomatic and has no acute concerns to discuss.  She has been taking amlodipine 2.5 mg daily as prescribed.  Chronic medical conditions and outstanding preventative care items discussed today are individually addressed as well.  Past Medical History:  Diagnosis Date   Cellulitis of leg, right    Complication of anesthesia    nausea and vomiting   Fatty liver    Fibroids    uterine   GERD (gastroesophageal reflux disease) 08/11/2019   Obesity (BMI 30-39.9) 08/11/2019   Vitamin D deficiency disease 08/11/2019   Past Surgical History:  Procedure Laterality Date   ABDOMINAL HYSTERECTOMY     TAH&BSO Age [age]yrs   BIOPSY  10/17/2021   Procedure: BIOPSY;  Surgeon: SWilford Corner MD;  Location: [facility];  Service: Endoscopy;;   CESAREAN SECTION     ESOPHAGOGASTRODUODENOSCOPY (EGD) WITH PROPOFOL N/A 10/17/2021   Procedure: ESOPHAGOGASTRODUODENOSCOPY (EGD) WITH PROPOFOL;  Surgeon: SWilford Corner MD;  Location: [facility];  Service: Endoscopy;  Laterality: N/A;   FOREIGN BODY REMOVAL N/A 10/17/2021   Procedure: FOREIGN BODY REMOVAL;  Surgeon: SWilford Corner MD;  Location: [facility];  Service: Endoscopy;  Laterality: N/A;   NEUROPLASTY / TRANSPOSITION ULNAR NERVE AT ELBOW Bilateral    Social History   Tobacco Use   Smoking status: Former    Types: Cigarettes    Quit date: 11/12/2006     Years since quitting: 15.8   Smokeless tobacco: Never   Tobacco comments:    Started smoking at age 89, quit in 2008. Smoked 2 packs a day.   Vaping Use   Vaping Use: Never used  Substance Use Topics   Alcohol use: No   Drug use: No   Family History  Problem Relation Age of Onset   Hyperlipidemia Mother    Hypertension Mother    Stroke Mother    Depression Mother    Obesity Mother    Alcohol abuse Father    Obesity Father    Lung cancer Father    Obesity Brother    Hypertension Brother    Sleep apnea Brother    Breast cancer Maternal Aunt    Uterine cancer Paternal Grandmother    Colon cancer Neg Hx    Liver disease Neg Hx    Allergies  Allergen Reactions   Morphine     Other reaction(s): trouble breathing   Naproxen Hives   Review of Systems  Constitutional:  Negative for chills and fever.  HENT:  Negative for sore throat.   Respiratory:  Negative for cough and shortness of breath.   Cardiovascular:  Negative for chest pain, palpitations and leg swelling.  Gastrointestinal:  Negative for abdominal pain, blood in stool, constipation, diarrhea, nausea and vomiting.  Genitourinary:  Negative for dysuria and hematuria.  Musculoskeletal:  Negative for myalgias.  Skin:  Negative for itching and  rash.  Neurological:  Negative for dizziness and headaches.  Psychiatric/Behavioral:  Negative for depression and suicidal ideas.      Objective:     BP 136/75   Pulse 67   Ht _0  (1.651 m)   Wt 228 lb 12.8 oz (103.8 kg)   SpO2 96%   BMI 38.07 kg/m  BP Readings from Last 3 Encounters:  09/13/22 136/75  06/13/22 (!) 142/82  05/11/22 140/90   Physical Exam Vitals reviewed.  Constitutional:      General: She is not in acute distress.    Appearance: Normal appearance. She is obese. She is not toxic-appearing.  HENT:     Head: Normocephalic and atraumatic.     Right Ear: External ear normal.     Left Ear: External ear normal.     Nose: Nose normal. No congestion  or rhinorrhea.     Mouth/Throat:     Mouth: Mucous membranes are moist.     Pharynx: Oropharynx is clear. No oropharyngeal exudate or posterior oropharyngeal erythema.  Eyes:     General: No scleral icterus.    Extraocular Movements: Extraocular movements intact.     Conjunctiva/sclera: Conjunctivae normal.     Pupils: Pupils are equal, round, and reactive to light.  Cardiovascular:     Rate and Rhythm: Normal rate and regular rhythm.     Pulses: Normal pulses.     Heart sounds: Normal heart sounds. No murmur heard.    No friction rub. No gallop.  Pulmonary:     Effort: Pulmonary effort is normal.     Breath sounds: Normal breath sounds. No wheezing, rhonchi or rales.  Abdominal:     General: Abdomen is flat. Bowel sounds are normal. There is no distension.     Palpations: Abdomen is soft.     Tenderness: There is no abdominal tenderness.  Musculoskeletal:        General: No swelling. Normal range of motion.     Cervical back: Normal range of motion.     Right lower leg: No edema.     Left lower leg: No edema.  Lymphadenopathy:     Cervical: No cervical adenopathy.  Skin:    General: Skin is warm and dry.     Capillary Refill: Capillary refill takes less than 2 seconds.     Coloration: Skin is not jaundiced.  Neurological:     General: No focal deficit present.     Mental Status: She is alert and oriented to person, place, and time.  Psychiatric:        Mood and Affect: Mood normal.        Behavior: Behavior normal.    Last CBC Lab Results  Component Value Date   WBC 5.7 04/03/2022   HGB 13.4 04/03/2022   HCT 38.8 04/03/2022   MCV 91 04/03/2022   MCH 31.3 04/03/2022   RDW 12.8 04/03/2022   PLT 247 11/55/2080   Last metabolic panel Lab Results  Component Value Date   GLUCOSE 91 04/03/2022   NA 139 04/03/2022   K 4.3 04/03/2022   CL 101 04/03/2022   CO2 24 04/03/2022   BUN 12 04/03/2022   CREATININE 0.71 04/03/2022   EGFR 100 04/03/2022   CALCIUM 9.4  04/03/2022   PROT 6.9 04/03/2022   ALBUMIN 4.6 04/03/2022   LABGLOB 2.3 04/03/2022   AGRATIO 2.0 04/03/2022   BILITOT 0.4 04/03/2022   ALKPHOS 56 04/03/2022   AST 25 04/03/2022   ALT 28 04/03/2022  ANIONGAP 12 10/16/2021   Last lipids Lab Results  Component Value Date   CHOL 174 04/03/2022   HDL 68 04/03/2022   LDLCALC 91 04/03/2022   TRIG 82 04/03/2022   CHOLHDL 2.6 04/03/2022   Last hemoglobin A1c Lab Results  Component Value Date   HGBA1C 5.4 04/03/2022   Last thyroid functions Lab Results  Component Value Date   TSH 1.480 04/03/2022   T3TOTAL 122 04/20/2020   T4TOTAL 7.4 04/20/2020   Last vitamin D Lab Results  Component Value Date   VD25OH 39.9 04/03/2022   Last vitamin B12 and Folate Lab Results  Component Value Date   VITAMINB12 234 04/20/2020   FOLATE 9.8 04/20/2020   The 10-year ASCVD risk score (Arnett DK, et al., 2019) is: 2.4%    Assessment & Plan:   Problem List Items Addressed This Visit       Hypertension - Primary    BP remains slightly above goal today, 136/75.  She is currently prescribed amlodipine 2.5 mg daily. -Increase amlodipine to 5 mg daily. -Follow-up in 3 months      Morbid obesity ([place])    Wegovy was started in May for weight loss.  She is currently taking injecting 1.7 mg weekly.  She has not experienced any side effects from [facility] refilled today. Continue at current dose, 1.7 mg weekly      Need for shingles vaccine    She received her second Shingrix vaccine today      Return in about 3 months (around 12/14/2022).    Johnette Abraham, MD

## 2022-09-13 NOTE — Patient Instructions (Signed)
It was a pleasure to see you today.  Thank you for giving [place] the opportunity to be involved in your care.  Below is a brief recap of your visit and next steps.  We will plan to see you again in 3 months.  Summary We will increase amlodipine to 5 mg daily today I have refilled Wegovy You will receive your second shingles vaccine today Follow up in 3 months

## 2022-09-17 ENCOUNTER — Other Ambulatory Visit (HOSPITAL_COMMUNITY): Payer: Self-pay

## 2022-09-20 DIAGNOSIS — Z23 Encounter for immunization: Secondary | ICD-10-CM | POA: Insufficient documentation

## 2022-09-20 NOTE — Assessment & Plan Note (Signed)
Nancy Gilmore was started in May for weight loss.  She is currently taking injecting 1.7 mg weekly.  She has not experienced any side effects from [facility]. [name] refilled today. Continue at current dose, 1.7 mg weekly

## 2022-09-20 NOTE — Assessment & Plan Note (Addendum)
BP remains slightly above goal today, 136/75.  She is currently prescribed amlodipine 2.5 mg daily. -Increase amlodipine to 5 mg daily. -Follow-up in 3 months

## 2022-09-20 NOTE — Assessment & Plan Note (Signed)
She received her second Shingrix vaccine today

## 2022-09-27 LAB — HM COLONOSCOPY

## 2022-10-09 ENCOUNTER — Other Ambulatory Visit: Payer: Self-pay | Admitting: Internal Medicine

## 2022-10-09 ENCOUNTER — Other Ambulatory Visit (HOSPITAL_COMMUNITY): Payer: Self-pay

## 2022-10-09 MED ORDER — WEGOVY 1.7 MG/0.75ML ~~LOC~~ SOAJ
1.7000 mg | SUBCUTANEOUS | 0 refills | Status: DC
  Filled 2022-10-09: qty 3, 28d supply, fill #0

## 2022-10-10 ENCOUNTER — Other Ambulatory Visit (HOSPITAL_COMMUNITY): Payer: Self-pay

## 2022-11-06 ENCOUNTER — Other Ambulatory Visit: Payer: Self-pay

## 2022-11-06 ENCOUNTER — Other Ambulatory Visit (HOSPITAL_COMMUNITY): Payer: Self-pay

## 2022-11-06 ENCOUNTER — Other Ambulatory Visit: Payer: Self-pay | Admitting: Internal Medicine

## 2022-11-07 ENCOUNTER — Other Ambulatory Visit (HOSPITAL_COMMUNITY): Payer: Self-pay

## 2022-11-07 ENCOUNTER — Other Ambulatory Visit: Payer: Self-pay

## 2022-11-07 MED ORDER — SEMAGLUTIDE-WEIGHT MANAGEMENT 2.4 MG/0.75ML ~~LOC~~ SOAJ
2.4000 mg | SUBCUTANEOUS | 0 refills | Status: DC
  Filled 2022-11-07 – 2022-11-19 (×2): qty 3, 28d supply, fill #0

## 2022-11-07 NOTE — Telephone Encounter (Signed)
Discussed w/ patient. Will increase Wegovy to 2.4 mg weekly.

## 2022-11-09 ENCOUNTER — Other Ambulatory Visit: Payer: Self-pay

## 2022-11-13 ENCOUNTER — Other Ambulatory Visit (HOSPITAL_COMMUNITY): Payer: Self-pay

## 2022-11-14 ENCOUNTER — Other Ambulatory Visit (HOSPITAL_COMMUNITY)
Admission: RE | Admit: 2022-11-14 | Discharge: 2022-11-14 | Disposition: A | Discharge status: Home / Self Care | Payer: 59 | Source: Ambulatory Visit | Attending: Nephrology | Admitting: Nephrology

## 2022-11-14 DIAGNOSIS — N181 Chronic kidney disease, stage 1: Secondary | ICD-10-CM | POA: Insufficient documentation

## 2022-11-14 LAB — RENAL FUNCTION PANEL
Albumin: 4.2 g/dL (ref 3.5–5.0)
Anion gap: 9 (ref 5–15)
BUN: 12 mg/dL (ref 6–20)
CO2: 26 mmol/L (ref 22–32)
Calcium: 8.6 mg/dL — ABNORMAL LOW (ref 8.9–10.3)
Chloride: 101 mmol/L (ref 98–111)
Creatinine, Ser: 0.71 mg/dL (ref 0.44–1.00)
GFR, Estimated: >60 mL/min (ref >60)
Glucose, Bld: 92 mg/dL (ref 70–99)
Phosphorus: 3.4 mg/dL (ref 2.5–4.6)
Potassium: 3.8 mmol/L (ref 3.5–5.1)
Sodium: 136 mmol/L (ref 135–145)

## 2022-11-15 ENCOUNTER — Other Ambulatory Visit (HOSPITAL_COMMUNITY): Payer: Self-pay

## 2022-11-15 ENCOUNTER — Other Ambulatory Visit: Payer: Self-pay | Admitting: Obstetrics and Gynecology

## 2022-11-15 DIAGNOSIS — Z1231 Encounter for screening mammogram for malignant neoplasm of breast: Secondary | ICD-10-CM

## 2022-11-15 DIAGNOSIS — R921 Mammographic calcification found on diagnostic imaging of breast: Secondary | ICD-10-CM

## 2022-11-15 MED ORDER — TELMISARTAN 20 MG PO TABS
10.0000 mg | ORAL_TABLET | Freq: Every day | ORAL | 3 refills | Status: DC
  Filled 2022-11-15: qty 45, 90d supply, fill #0
  Filled 2022-11-19: qty 5, 10d supply, fill #0
  Filled 2022-11-19 – 2022-11-30 (×2): qty 45, 90d supply, fill #0
  Filled 2023-03-12: qty 45, 90d supply, fill #1
  Filled 2023-06-10: qty 45, 90d supply, fill #2
  Filled 2023-06-10: qty 30, 60d supply, fill #2
  Filled 2023-08-05: qty 30, 60d supply, fill #3
  Filled 2023-09-30: qty 30, 60d supply, fill #4
  Filled ????-??-??: fill #0

## 2022-11-19 ENCOUNTER — Other Ambulatory Visit: Payer: Self-pay

## 2022-11-19 ENCOUNTER — Other Ambulatory Visit (HOSPITAL_COMMUNITY): Payer: Self-pay

## 2022-11-19 ENCOUNTER — Other Ambulatory Visit (HOSPITAL_BASED_OUTPATIENT_CLINIC_OR_DEPARTMENT_OTHER): Payer: Self-pay

## 2022-11-20 ENCOUNTER — Other Ambulatory Visit (HOSPITAL_COMMUNITY): Payer: Self-pay

## 2022-11-20 ENCOUNTER — Other Ambulatory Visit: Payer: Self-pay

## 2022-11-21 ENCOUNTER — Telehealth: Payer: Self-pay | Admitting: Internal Medicine

## 2022-11-21 NOTE — Telephone Encounter (Signed)
Patient aware we are waiting on determination.

## 2022-11-21 NOTE — Telephone Encounter (Signed)
Patient called in regard to  Semaglutide-Weight Management 2.4 MG/0.75ML SOAJ   Med needs pre auth   Patient wants a call back in regard has not heard anything back in regard.

## 2022-11-30 ENCOUNTER — Other Ambulatory Visit: Payer: Self-pay

## 2022-11-30 ENCOUNTER — Other Ambulatory Visit (HOSPITAL_COMMUNITY): Payer: Self-pay

## 2022-12-04 ENCOUNTER — Other Ambulatory Visit (HOSPITAL_COMMUNITY): Payer: Self-pay

## 2022-12-04 ENCOUNTER — Encounter (HOSPITAL_COMMUNITY): Payer: Self-pay

## 2022-12-05 ENCOUNTER — Other Ambulatory Visit: Payer: Self-pay

## 2022-12-05 ENCOUNTER — Other Ambulatory Visit (HOSPITAL_COMMUNITY): Payer: Self-pay

## 2022-12-17 ENCOUNTER — Ambulatory Visit (INDEPENDENT_AMBULATORY_CARE_PROVIDER_SITE_OTHER): Payer: 59 | Admitting: Internal Medicine

## 2022-12-17 ENCOUNTER — Encounter: Payer: Self-pay | Admitting: Internal Medicine

## 2022-12-17 VITALS — BP 125/74 | HR 68 | Ht 65.0 in | Wt 222.8 lb

## 2022-12-17 DIAGNOSIS — K760 Fatty (change of) liver, not elsewhere classified: Secondary | ICD-10-CM

## 2022-12-17 DIAGNOSIS — K219 Gastro-esophageal reflux disease without esophagitis: Secondary | ICD-10-CM

## 2022-12-17 DIAGNOSIS — N181 Chronic kidney disease, stage 1: Secondary | ICD-10-CM | POA: Diagnosis not present

## 2022-12-17 DIAGNOSIS — Z6841 Body Mass Index (BMI) 40.0 and over, adult: Secondary | ICD-10-CM | POA: Diagnosis not present

## 2022-12-17 DIAGNOSIS — I10 Essential (primary) hypertension: Secondary | ICD-10-CM | POA: Diagnosis not present

## 2022-12-17 DIAGNOSIS — E66813 Obesity, class 3: Secondary | ICD-10-CM

## 2022-12-17 NOTE — Patient Instructions (Signed)
It was a pleasure to see you today.  Thank you for giving [place] the opportunity to be involved in your care.  Below is a brief recap of your visit and next steps.  We will plan to see you again in 4 months.  Summary No medication changes today We will plan for follow up in 4 months for your annual exam.

## 2022-12-17 NOTE — Progress Notes (Unsigned)
Established Patient Office Visit  Subjective   Patient ID: Nancy Gilmore, female    DOB: 10-Jul-1966  Age: 57 y.o. MRN: [id number]  Chief Complaint  Patient presents with   Hypertension    Follow up   Nancy Gilmore returns to care today.  She was last seen by me on 09/13/22 at which time amlodipine was increased to 5 mg daily.  She also received her second Shingrix vaccine.  Three month follow-up was arranged.  There have been no acute interval events.  Past Medical History:  Diagnosis Date   Cellulitis of leg, right    Complication of anesthesia    nausea and vomiting   Fatty liver    Fibroids    uterine   GERD (gastroesophageal reflux disease) 08/11/2019   Obesity (BMI 30-39.9) 08/11/2019   Vitamin D deficiency disease 08/11/2019   Past Surgical History:  Procedure Laterality Date   ABDOMINAL HYSTERECTOMY     TAH&BSO Age 89 yrs   BIOPSY  10/17/2021   Procedure: BIOPSY;  Surgeon: Wilford Corner, MD;  Location: [facility];  Service: Endoscopy;;   CESAREAN SECTION     ESOPHAGOGASTRODUODENOSCOPY (EGD) WITH PROPOFOL N/A 10/17/2021   Procedure: ESOPHAGOGASTRODUODENOSCOPY (EGD) WITH PROPOFOL;  Surgeon: Wilford Corner, MD;  Location: [facility];  Service: Endoscopy;  Laterality: N/A;   FOREIGN BODY REMOVAL N/A 10/17/2021   Procedure: FOREIGN BODY REMOVAL;  Surgeon: Wilford Corner, MD;  Location: [facility];  Service: Endoscopy;  Laterality: N/A;   NEUROPLASTY / TRANSPOSITION ULNAR NERVE AT ELBOW Bilateral    TOTAL ABDOMINAL HYSTERECTOMY     Social History   Tobacco Use   Smoking status: Former    Types: Cigarettes    Quit date: 11/12/2006    Years since quitting: 16.1   Smokeless tobacco: Never   Tobacco comments:    Started smoking at age 81 , quit in 2008. Smoked 2 packs a day.   Vaping Use   Vaping Use: Never used  Substance Use Topics   Alcohol use: No   Drug use: No   Family History  Problem Relation Age of Onset   Hyperlipidemia Mother     Hypertension Mother    Stroke Mother    Depression Mother    Obesity Mother    Alcohol abuse Father    Obesity Father    Lung cancer Father    Obesity Brother    Hypertension Brother    Sleep apnea Brother    Breast cancer Maternal Aunt    Uterine cancer Paternal Grandmother    Colon cancer Neg Hx    Liver disease Neg Hx    Allergies  Allergen Reactions   Morphine     Other reaction(s): trouble breathing   Naproxen Hives   Review of Systems  Constitutional:  Negative for chills and fever.  HENT:  Negative for sore throat.   Respiratory:  Negative for cough and shortness of breath.   Cardiovascular:  Negative for chest pain, palpitations and leg swelling.  Gastrointestinal:  Negative for abdominal pain, blood in stool, constipation, diarrhea, nausea and vomiting.  Genitourinary:  Negative for dysuria and hematuria.  Musculoskeletal:  Negative for myalgias.  Skin:  Negative for itching and rash.  Neurological:  Negative for dizziness and headaches.  Psychiatric/Behavioral:  Negative for depression and suicidal ideas.      Objective:     BP 125/74   Pulse 68   Ht '5\' 5"'$  (1.651 m)   Wt 222 lb 12.8 oz (101.1 kg)  SpO2 95%   BMI 37.08 kg/m  BP Readings from Last 3 Encounters:  12/17/22 125/74  09/13/22 136/75  06/13/22 (!) 142/82   Physical Exam Vitals reviewed.  Constitutional:      General: She is not in acute distress.    Appearance: Normal appearance. She is obese. She is not toxic-appearing.  HENT:     Head: Normocephalic and atraumatic.     Right Ear: External ear normal.     Left Ear: External ear normal.     Nose: Nose normal. No congestion or rhinorrhea.     Mouth/Throat:     Mouth: Mucous membranes are moist.     Pharynx: Oropharynx is clear. No oropharyngeal exudate or posterior oropharyngeal erythema.  Eyes:     General: No scleral icterus.    Extraocular Movements: Extraocular movements intact.     Conjunctiva/sclera: Conjunctivae normal.      Pupils: Pupils are equal, round, and reactive to light.  Cardiovascular:     Rate and Rhythm: Normal rate and regular rhythm.     Pulses: Normal pulses.     Heart sounds: Normal heart sounds. No murmur heard.    No friction rub. No gallop.  Pulmonary:     Effort: Pulmonary effort is normal.     Breath sounds: Normal breath sounds. No wheezing, rhonchi or rales.  Abdominal:     General: Abdomen is flat. Bowel sounds are normal. There is no distension.     Palpations: Abdomen is soft.     Tenderness: There is no abdominal tenderness.  Musculoskeletal:        General: No swelling. Normal range of motion.     Cervical back: Normal range of motion.     Right lower leg: No edema.     Left lower leg: No edema.  Lymphadenopathy:     Cervical: No cervical adenopathy.  Skin:    General: Skin is warm and dry.     Capillary Refill: Capillary refill takes less than 2 seconds.     Coloration: Skin is not jaundiced.  Neurological:     General: No focal deficit present.     Mental Status: She is alert and oriented to person, place, and time.  Psychiatric:        Mood and Affect: Mood normal.        Behavior: Behavior normal.   Last CBC Lab Results  Component Value Date   WBC 5.7 04/03/2022   HGB 13.4 04/03/2022   HCT 38.8 04/03/2022   MCV 91 04/03/2022   MCH 31.3 04/03/2022   RDW 12.8 04/03/2022   PLT 247 35/32/9924   Last metabolic panel Lab Results  Component Value Date   GLUCOSE 92 11/14/2022   NA 136 11/14/2022   K 3.8 11/14/2022   CL 101 11/14/2022   CO2 26 11/14/2022   BUN 12 11/14/2022   CREATININE 0.71 11/14/2022   GFRNONAA >60 11/14/2022   CALCIUM 8.6 (L) 11/14/2022   PHOS 3.4 11/14/2022   PROT 6.9 04/03/2022   ALBUMIN 4.2 11/14/2022   LABGLOB 2.3 04/03/2022   AGRATIO 2.0 04/03/2022   BILITOT 0.4 04/03/2022   ALKPHOS 56 04/03/2022   AST 25 04/03/2022   ALT 28 04/03/2022   ANIONGAP 9 11/14/2022   Last lipids Lab Results  Component Value Date   CHOL 174  04/03/2022   HDL 68 04/03/2022   LDLCALC 91 04/03/2022   TRIG 82 04/03/2022   CHOLHDL 2.6 04/03/2022   Last hemoglobin A1c Lab Results  Component Value Date   HGBA1C 5.4 04/03/2022   Last thyroid functions Lab Results  Component Value Date   TSH 1.480 04/03/2022   T3TOTAL 122 04/20/2020   T4TOTAL 7.4 04/20/2020   Last vitamin D Lab Results  Component Value Date   VD25OH 39.9 04/03/2022   Last vitamin B12 and Folate Lab Results  Component Value Date   VITAMINB12 234 04/20/2020   FOLATE 9.8 04/20/2020   The 10-year ASCVD risk score (Arnett DK, et al., 2019) is: 2.3%    Assessment & Plan:   Problem List Items Addressed This Visit   None   No follow-ups on file.    Johnette Abraham, MD

## 2022-12-19 DIAGNOSIS — K760 Fatty (change of) liver, not elsewhere classified: Secondary | ICD-10-CM | POA: Insufficient documentation

## 2022-12-19 DIAGNOSIS — N181 Chronic kidney disease, stage 1: Secondary | ICD-10-CM | POA: Insufficient documentation

## 2022-12-19 NOTE — Assessment & Plan Note (Signed)
Symptoms remain well-controlled on omeprazole 40 mg as needed.

## 2022-12-19 NOTE — Assessment & Plan Note (Signed)
Currently prescribed Wegovy 2.4 mg weekly.  She has lost 28 pounds since May 2023. -No changes today.

## 2022-12-19 NOTE — Assessment & Plan Note (Signed)
She is currently prescribed telmisartan 10 mg daily and amlodipine 5 mg daily for treatment of hypertension.  Telmisartan was recently added per nephrology.  Amlodipine was increased to 5 mg daily at her last appointment with me.  Her blood pressure today is 125/74. -No medication changes today.  I recommended that she continue her current antihypertensive regimen

## 2022-12-19 NOTE — Assessment & Plan Note (Signed)
Noted on prior imaging.  We discussed that the best treatment is weight loss.  She is currently on Wegovy and has lost 6 pounds since her last appointment.

## 2022-12-19 NOTE — Assessment & Plan Note (Signed)
Followed by nephrology (Dr. Theador Hawthorne).  Renal function stable on latest labs.

## 2022-12-20 ENCOUNTER — Other Ambulatory Visit (HOSPITAL_COMMUNITY): Payer: Self-pay

## 2022-12-25 ENCOUNTER — Telehealth: Payer: Self-pay | Admitting: Internal Medicine

## 2022-12-25 NOTE — Telephone Encounter (Signed)
Patient wants a call back in regard to [facility] forms

## 2022-12-25 NOTE — Telephone Encounter (Signed)
FMLA   Copied Noted sleeved

## 2022-12-25 NOTE — Telephone Encounter (Signed)
Spoke with patient.

## 2022-12-31 ENCOUNTER — Other Ambulatory Visit: Payer: Self-pay | Admitting: Internal Medicine

## 2022-12-31 ENCOUNTER — Other Ambulatory Visit (HOSPITAL_COMMUNITY): Payer: Self-pay

## 2022-12-31 ENCOUNTER — Other Ambulatory Visit: Payer: Self-pay

## 2022-12-31 MED ORDER — WEGOVY 2.4 MG/0.75ML ~~LOC~~ SOAJ
2.4000 mg | SUBCUTANEOUS | 0 refills | Status: DC
  Filled 2022-12-31: qty 3, 28d supply, fill #0

## 2023-01-01 ENCOUNTER — Other Ambulatory Visit (HOSPITAL_COMMUNITY): Payer: Self-pay

## 2023-01-08 ENCOUNTER — Ambulatory Visit
Admission: RE | Admit: 2023-01-08 | Discharge: 2023-01-08 | Disposition: A | Discharge status: Home / Self Care | Payer: 59 | Source: Ambulatory Visit | Attending: Obstetrics and Gynecology | Admitting: Obstetrics and Gynecology

## 2023-01-08 DIAGNOSIS — R921 Mammographic calcification found on diagnostic imaging of breast: Secondary | ICD-10-CM | POA: Diagnosis not present

## 2023-01-30 ENCOUNTER — Other Ambulatory Visit: Payer: Self-pay | Admitting: Internal Medicine

## 2023-01-30 ENCOUNTER — Other Ambulatory Visit (HOSPITAL_COMMUNITY): Payer: Self-pay

## 2023-01-30 ENCOUNTER — Other Ambulatory Visit: Payer: Self-pay

## 2023-01-30 MED ORDER — WEGOVY 2.4 MG/0.75ML ~~LOC~~ SOAJ
2.4000 mg | SUBCUTANEOUS | 0 refills | Status: DC
  Filled 2023-01-30: qty 3, 28d supply, fill #0

## 2023-03-01 ENCOUNTER — Other Ambulatory Visit (HOSPITAL_COMMUNITY): Payer: Self-pay

## 2023-03-01 DIAGNOSIS — Z6839 Body mass index (BMI) 39.0-39.9, adult: Secondary | ICD-10-CM | POA: Diagnosis not present

## 2023-03-01 DIAGNOSIS — Z01419 Encounter for gynecological examination (general) (routine) without abnormal findings: Secondary | ICD-10-CM | POA: Diagnosis not present

## 2023-03-01 DIAGNOSIS — N959 Unspecified menopausal and perimenopausal disorder: Secondary | ICD-10-CM | POA: Diagnosis not present

## 2023-03-01 MED ORDER — ESTRADIOL 0.1 MG/24HR TD PTTW
1.0000 | MEDICATED_PATCH | TRANSDERMAL | 12 refills | Status: AC
  Filled 2023-03-12 – 2023-04-18 (×2): qty 24, 84d supply, fill #0
  Filled 2023-07-07: qty 24, 84d supply, fill #1
  Filled 2023-09-30: qty 24, 84d supply, fill #2
  Filled 2023-12-21 – 2024-01-24 (×2): qty 24, 84d supply, fill #3

## 2023-03-03 ENCOUNTER — Other Ambulatory Visit: Payer: Self-pay | Admitting: Internal Medicine

## 2023-03-04 ENCOUNTER — Other Ambulatory Visit: Payer: Self-pay

## 2023-03-04 ENCOUNTER — Other Ambulatory Visit (HOSPITAL_COMMUNITY): Payer: Self-pay

## 2023-03-04 MED ORDER — WEGOVY 2.4 MG/0.75ML ~~LOC~~ SOAJ
2.4000 mg | SUBCUTANEOUS | 0 refills | Status: AC
  Filled 2023-03-04: qty 3, 28d supply, fill #0

## 2023-03-12 ENCOUNTER — Other Ambulatory Visit: Payer: Self-pay

## 2023-03-12 ENCOUNTER — Other Ambulatory Visit: Payer: Self-pay | Admitting: Internal Medicine

## 2023-03-12 ENCOUNTER — Other Ambulatory Visit (HOSPITAL_COMMUNITY): Payer: Self-pay

## 2023-03-12 DIAGNOSIS — I10 Essential (primary) hypertension: Secondary | ICD-10-CM

## 2023-03-12 MED ORDER — AMLODIPINE BESYLATE 5 MG PO TABS
5.0000 mg | ORAL_TABLET | Freq: Every day | ORAL | 1 refills | Status: DC
  Filled 2023-03-12: qty 90, 90d supply, fill #0
  Filled 2023-06-13: qty 90, 90d supply, fill #1

## 2023-03-22 ENCOUNTER — Other Ambulatory Visit (HOSPITAL_COMMUNITY): Payer: Self-pay

## 2023-03-22 DIAGNOSIS — M722 Plantar fascial fibromatosis: Secondary | ICD-10-CM | POA: Diagnosis not present

## 2023-03-22 MED ORDER — MELOXICAM 7.5 MG PO TABS
7.5000 mg | ORAL_TABLET | Freq: Two times a day (BID) | ORAL | 0 refills | Status: DC
  Filled 2023-03-22: qty 60, 30d supply, fill #0

## 2023-04-03 DIAGNOSIS — D631 Anemia in chronic kidney disease: Secondary | ICD-10-CM | POA: Diagnosis not present

## 2023-04-03 DIAGNOSIS — N183 Chronic kidney disease, stage 3 unspecified: Secondary | ICD-10-CM | POA: Diagnosis not present

## 2023-04-03 DIAGNOSIS — R809 Proteinuria, unspecified: Secondary | ICD-10-CM | POA: Diagnosis not present

## 2023-04-08 DIAGNOSIS — K760 Fatty (change of) liver, not elsewhere classified: Secondary | ICD-10-CM | POA: Diagnosis not present

## 2023-04-08 DIAGNOSIS — I129 Hypertensive chronic kidney disease with stage 1 through stage 4 chronic kidney disease, or unspecified chronic kidney disease: Secondary | ICD-10-CM | POA: Diagnosis not present

## 2023-04-08 DIAGNOSIS — Z6837 Body mass index (BMI) 37.0-37.9, adult: Secondary | ICD-10-CM | POA: Diagnosis not present

## 2023-04-08 DIAGNOSIS — R809 Proteinuria, unspecified: Secondary | ICD-10-CM | POA: Diagnosis not present

## 2023-04-12 ENCOUNTER — Encounter: Payer: Self-pay | Admitting: Internal Medicine

## 2023-04-12 ENCOUNTER — Ambulatory Visit (INDEPENDENT_AMBULATORY_CARE_PROVIDER_SITE_OTHER): Payer: 59 | Admitting: Internal Medicine

## 2023-04-12 VITALS — BP 112/62 | HR 57 | Ht 65.0 in | Wt 218.6 lb

## 2023-04-12 DIAGNOSIS — E559 Vitamin D deficiency, unspecified: Secondary | ICD-10-CM

## 2023-04-12 DIAGNOSIS — I10 Essential (primary) hypertension: Secondary | ICD-10-CM

## 2023-04-12 DIAGNOSIS — K219 Gastro-esophageal reflux disease without esophagitis: Secondary | ICD-10-CM

## 2023-04-12 DIAGNOSIS — E669 Obesity, unspecified: Secondary | ICD-10-CM | POA: Diagnosis not present

## 2023-04-12 DIAGNOSIS — Z0001 Encounter for general adult medical examination with abnormal findings: Secondary | ICD-10-CM | POA: Diagnosis not present

## 2023-04-12 DIAGNOSIS — N181 Chronic kidney disease, stage 1: Secondary | ICD-10-CM | POA: Diagnosis not present

## 2023-04-12 NOTE — Progress Notes (Signed)
Complete physical exam  Patient: Nancy Gilmore   DOB: 09-22-1966   57 y.o. Female  MRN: [id number]  Subjective:    Chief Complaint  Patient presents with   Annual Exam    Nancy Gilmore is a 57 y.o. female who presents today for a complete physical exam. She reports consuming a general diet.  She exercises by working around her home  She generally feels well. She reports sleeping well. She does not have additional problems to discuss today.    Most recent fall risk assessment:    04/12/2023    9:22 AM  Fall Risk   Falls in the past year? 0  Number falls in past yr: 0  Injury with Fall? 0  Risk for fall due to : No Fall Risks  Follow up Falls evaluation completed     Most recent depression screenings:    04/12/2023    9:22 AM 12/17/2022    8:13 AM  PHQ 2/9 Scores  PHQ - 2 Score 0 0  PHQ- 9 Score 0 0   Vision:Within last year and Dental: No current dental problems and Receives regular dental care  Past Medical History:  Diagnosis Date   Allergy    Arthritis    Cellulitis of leg, right    Complication of anesthesia    nausea and vomiting   Fatty liver    Fibroids    uterine   GERD (gastroesophageal reflux disease) 08/11/2019   Obesity (BMI 30-39.9) 08/11/2019   Vitamin D deficiency disease 08/11/2019   Past Surgical History:  Procedure Laterality Date   ABDOMINAL HYSTERECTOMY     TAH&BSO Age 47 yrs   BIOPSY  10/17/2021   Procedure: BIOPSY;  Surgeon: Charlott Rakes, MD;  Location: [facility];  Service: Endoscopy;;   CESAREAN SECTION     ESOPHAGOGASTRODUODENOSCOPY (EGD) WITH PROPOFOL N/A 10/17/2021   Procedure: ESOPHAGOGASTRODUODENOSCOPY (EGD) WITH PROPOFOL;  Surgeon: Charlott Rakes, MD;  Location: [facility];  Service: Endoscopy;  Laterality: N/A;   FOREIGN BODY REMOVAL N/A 10/17/2021   Procedure: FOREIGN BODY REMOVAL;  Surgeon: Charlott Rakes, MD;  Location: [facility];  Service: Endoscopy;  Laterality: N/A;   NEUROPLASTY / TRANSPOSITION  ULNAR NERVE AT ELBOW Bilateral    TOTAL ABDOMINAL HYSTERECTOMY     Social History   Tobacco Use   Smoking status: Former    Types: Cigarettes    Quit date: 11/12/2006    Years since quitting: 16.4   Smokeless tobacco: Never   Tobacco comments:    Started smoking at age 66 , quit in 2008. Smoked 2 packs a day.   Vaping Use   Vaping Use: Never used  Substance Use Topics   Alcohol use: No   Drug use: No   Family History  Problem Relation Age of Onset   Hyperlipidemia Mother    Hypertension Mother    Stroke Mother    Depression Mother    Obesity Mother    Alcohol abuse Father    Obesity Father    Lung cancer Father    Obesity Brother    Hypertension Brother    Sleep apnea Brother    Breast cancer Maternal Aunt    Uterine cancer Paternal Grandmother    Colon cancer Neg Hx    Liver disease Neg Hx    Allergies  Allergen Reactions   Morphine     Other reaction(s): trouble breathing   Naproxen Hives   Patient Care Team: Billie Lade, MD as PCP - General (  Internal Medicine)   Outpatient Medications Prior to Visit  Medication Sig   amLODipine (NORVASC) 5 MG tablet Take 1 tablet (5 mg total) by mouth daily.   estradiol (VIVELLE-DOT) 0.1 MG/24HR patch APPLY 1 PATCH TWICE WEEKLY AS DIRECTED.   estradiol (VIVELLE-DOT) 0.1 MG/24HR patch Place 1 patch (0.1 mg total) onto the skin 2 (two) times a week.   meloxicam (MOBIC) 7.5 MG tablet Take 1 tablet (7.5 mg total) by mouth 2 (two) times for two weeks and then as needed.   telmisartan (MICARDIS) 20 MG tablet Take 1/2 tablet (10 mg total) by mouth daily.   No facility-administered medications prior to visit.   Review of Systems  Constitutional:  Negative for chills and fever.  HENT:  Negative for sore throat.   Respiratory:  Negative for cough and shortness of breath.   Cardiovascular:  Negative for chest pain, palpitations and leg swelling.  Gastrointestinal:  Negative for abdominal pain, blood in stool, constipation,  diarrhea, nausea and vomiting.  Genitourinary:  Negative for dysuria and hematuria.  Musculoskeletal:  Negative for myalgias.  Skin:  Negative for itching and rash.  Neurological:  Negative for dizziness and headaches.  Psychiatric/Behavioral:  Negative for depression and suicidal ideas.       Objective:     BP 112/62   Pulse (!) 57   Ht 5\' 5"  (1.651 m)   Wt 218 lb 9.6 oz (99.2 kg)   SpO2 96%   BMI 36.38 kg/m  BP Readings from Last 3 Encounters:  04/12/23 112/62  12/17/22 125/74  09/13/22 136/75   Physical Exam Vitals reviewed.  Constitutional:      General: She is not in acute distress.    Appearance: Normal appearance. She is obese. She is not toxic-appearing.  HENT:     Head: Normocephalic and atraumatic.     Right Ear: External ear normal.     Left Ear: External ear normal.     Nose: Nose normal. No congestion or rhinorrhea.     Mouth/Throat:     Mouth: Mucous membranes are moist.     Pharynx: Oropharynx is clear. No oropharyngeal exudate or posterior oropharyngeal erythema.  Eyes:     General: No scleral icterus.    Extraocular Movements: Extraocular movements intact.     Conjunctiva/sclera: Conjunctivae normal.     Pupils: Pupils are equal, round, and reactive to light.  Cardiovascular:     Rate and Rhythm: Normal rate and regular rhythm.     Pulses: Normal pulses.     Heart sounds: Normal heart sounds. No murmur heard.    No friction rub. No gallop.  Pulmonary:     Effort: Pulmonary effort is normal.     Breath sounds: Normal breath sounds. No wheezing, rhonchi or rales.  Abdominal:     General: Abdomen is flat. Bowel sounds are normal. There is no distension.     Palpations: Abdomen is soft.     Tenderness: There is no abdominal tenderness.  Musculoskeletal:        General: No swelling. Normal range of motion.     Cervical back: Normal range of motion.     Right lower leg: No edema.     Left lower leg: No edema.  Lymphadenopathy:     Cervical: No  cervical adenopathy.  Skin:    General: Skin is warm and dry.     Capillary Refill: Capillary refill takes less than 2 seconds.     Coloration: Skin is not jaundiced.  Neurological:  General: No focal deficit present.     Mental Status: She is alert and oriented to person, place, and time.  Psychiatric:        Mood and Affect: Mood normal.        Behavior: Behavior normal.   Last CBC Lab Results  Component Value Date   WBC 5.7 04/03/2022   HGB 13.4 04/03/2022   HCT 38.8 04/03/2022   MCV 91 04/03/2022   MCH 31.3 04/03/2022   RDW 12.8 04/03/2022   PLT 247 04/03/2022   Last metabolic panel Lab Results  Component Value Date   GLUCOSE 92 11/14/2022   NA 136 11/14/2022   K 3.8 11/14/2022   CL 101 11/14/2022   CO2 26 11/14/2022   BUN 12 11/14/2022   CREATININE 0.71 11/14/2022   GFRNONAA >60 11/14/2022   CALCIUM 8.6 (L) 11/14/2022   PHOS 3.4 11/14/2022   PROT 6.9 04/03/2022   ALBUMIN 4.2 11/14/2022   LABGLOB 2.3 04/03/2022   AGRATIO 2.0 04/03/2022   BILITOT 0.4 04/03/2022   ALKPHOS 56 04/03/2022   AST 25 04/03/2022   ALT 28 04/03/2022   ANIONGAP 9 11/14/2022   Last lipids Lab Results  Component Value Date   CHOL 174 04/03/2022   HDL 68 04/03/2022   LDLCALC 91 04/03/2022   TRIG 82 04/03/2022   CHOLHDL 2.6 04/03/2022   Last hemoglobin A1c Lab Results  Component Value Date   HGBA1C 5.4 04/03/2022   Last thyroid functions Lab Results  Component Value Date   TSH 1.480 04/03/2022   T3TOTAL 122 04/20/2020   T4TOTAL 7.4 04/20/2020   Last vitamin D Lab Results  Component Value Date   VD25OH 39.9 04/03/2022   Last vitamin B12 and Folate Lab Results  Component Value Date   VITAMINB12 234 04/20/2020   FOLATE 9.8 04/20/2020     Assessment & Plan:    Routine Health Maintenance and Physical Exam  Immunization History  Administered Date(s) Administered   Influenza Split 08/30/2022   Influenza-Unspecified 08/13/2019, 08/12/2020, 09/01/2020, 08/31/2021    Moderna SARS-COV2 Booster Vaccination 04/08/2021   Moderna Sars-Covid-2 Vaccination 02/29/2020, 03/30/2020   Td 11/13/2015   Tdap 10/24/1995   Zoster Recombinat (Shingrix) 04/03/2022, 09/13/2022    Health Maintenance  Topic Date Due   INFLUENZA VACCINE  06/13/2023   Fecal DNA (Cologuard)  02/04/2024   MAMMOGRAM  01/08/2025   DTaP/Tdap/Td (3 - Td or Tdap) 11/12/2025   Hepatitis C Screening  Completed   HIV Screening  Completed   Zoster Vaccines- Shingrix  Completed   HPV VACCINES  Aged Out   COVID-19 Vaccine  Discontinued    Discussed health benefits of physical activity, and encouraged her to engage in regular exercise appropriate for her age and condition.  Problem List Items Addressed This Visit       Hypertension    BP remains well-controlled on telmisartan 10 mg daily and amlodipine 5 mg daily.  No medication changes indicated today.      GERD (gastroesophageal reflux disease)    Symptoms remain well-controlled with omeprazole 40 mg as needed.      Chronic kidney disease (CKD), stage I    Followed by nephrology (Dr. Wolfgang Phoenix).  Recently seen for follow-up. -Repeat labs ordered today      Obesity (BMI 30-39.9)    BMI 36.3 today.  She has lost 30 pounds over the last year since starting [facility] in May 2023.  Her insurance will no longer cover Wegovy.  She has 2 additional injections. -She  will focus on lifestyle changes made over the last year and is sustaining recent weight loss.  We will tentatively plan for follow-up in 3 months for reassessment.      Encounter for well adult exam with abnormal findings - Primary    Presenting today for her annual exam.  Recent labs and records have been reviewed. -Repeat labs ordered today -Preventative care items are up-to-date -We will tentatively plan for follow-up in 3 months      Return in about 3 months (around 07/13/2023).  Billie Lade, MD

## 2023-04-12 NOTE — Assessment & Plan Note (Signed)
Symptoms remain well-controlled with omeprazole 40 mg as needed.

## 2023-04-12 NOTE — Assessment & Plan Note (Signed)
Followed by nephrology (Dr. Wolfgang Phoenix).  Recently seen for follow-up. -Repeat labs ordered today

## 2023-04-12 NOTE — Patient Instructions (Signed)
It was a pleasure to see you today.  Thank you for giving [place] the opportunity to be involved in your care.  Below is a brief recap of your visit and next steps.  We will plan to see you again in 3 months.  Summary Annual exam completed today No medication changes have been made We will plan for follow up in 3 months

## 2023-04-12 NOTE — Assessment & Plan Note (Signed)
BMI 36.3 today.  She has lost 30 pounds over the last year since starting [facility] in May 2023.  Her insurance will no longer cover Wegovy.  She has 2 additional injections. -She will focus on lifestyle changes made over the last year and is sustaining recent weight loss.  We will tentatively plan for follow-up in 3 months for reassessment.

## 2023-04-12 NOTE — Assessment & Plan Note (Signed)
Presenting today for her annual exam.  Recent labs and records have been reviewed. -Repeat labs ordered today -Preventative care items are up-to-date -We will tentatively plan for follow-up in 3 months

## 2023-04-12 NOTE — Assessment & Plan Note (Signed)
BP remains well-controlled on telmisartan 10 mg daily and amlodipine 5 mg daily.  No medication changes indicated today.

## 2023-04-13 LAB — CBC WITH DIFFERENTIAL/PLATELET
Basophils Absolute: 0.1 10*3/uL (ref 0.0–0.2)
Basos: 1 %
EOS (ABSOLUTE): 0.4 10*3/uL (ref 0.0–0.4)
Eos: 7 %
Hematocrit: 40.2 % (ref 34.0–46.6)
Hemoglobin: 13.4 g/dL (ref 11.1–15.9)
Immature Grans (Abs): 0 10*3/uL (ref 0.0–0.1)
Immature Granulocytes: 0 %
Lymphocytes Absolute: 1.9 10*3/uL (ref 0.7–3.1)
Lymphs: 34 %
MCH: 31.2 pg (ref 26.6–33.0)
MCHC: 33.3 g/dL (ref 31.5–35.7)
MCV: 94 fL (ref 79–97)
Monocytes Absolute: 0.5 10*3/uL (ref 0.1–0.9)
Monocytes: 8 %
Neutrophils Absolute: 2.8 10*3/uL (ref 1.4–7.0)
Neutrophils: 50 %
Platelets: 239 10*3/uL (ref 150–450)
RBC: 4.29 x10E6/uL (ref 3.77–5.28)
RDW: 12.8 % (ref 11.7–15.4)
WBC: 5.7 10*3/uL (ref 3.4–10.8)

## 2023-04-13 LAB — CMP14+EGFR
ALT: 14 IU/L (ref 0–32)
AST: 16 IU/L (ref 0–40)
Albumin/Globulin Ratio: 1.8 (ref 1.2–2.2)
Albumin: 4.4 g/dL (ref 3.8–4.9)
Alkaline Phosphatase: 53 IU/L (ref 44–121)
BUN/Creatinine Ratio: 22 (ref 9–23)
BUN: 16 mg/dL (ref 6–24)
Bilirubin Total: 0.5 mg/dL (ref 0.0–1.2)
CO2: 22 mmol/L (ref 20–29)
Calcium: 9.1 mg/dL (ref 8.7–10.2)
Chloride: 103 mmol/L (ref 96–106)
Creatinine, Ser: 0.74 mg/dL (ref 0.57–1.00)
Globulin, Total: 2.4 g/dL (ref 1.5–4.5)
Glucose: 79 mg/dL (ref 70–99)
Potassium: 4.6 mmol/L (ref 3.5–5.2)
Sodium: 139 mmol/L (ref 134–144)
Total Protein: 6.8 g/dL (ref 6.0–8.5)
eGFR: 94 mL/min/{1.73_m2} (ref >59)

## 2023-04-13 LAB — VITAMIN D 25 HYDROXY (VIT D DEFICIENCY, FRACTURES): Vit D, 25-Hydroxy: 30.7 ng/mL (ref 30.0–100.0)

## 2023-04-13 LAB — HEMOGLOBIN A1C
Est. average glucose Bld gHb Est-mCnc: 103 mg/dL
Hgb A1c MFr Bld: 5.2 % (ref 4.8–5.6)

## 2023-04-13 LAB — LIPID PANEL
Chol/HDL Ratio: 2.3 ratio (ref 0.0–4.4)
Cholesterol, Total: 181 mg/dL (ref 100–199)
HDL: 79 mg/dL (ref >39)
LDL Chol Calc (NIH): 93 mg/dL (ref 0–99)
Triglycerides: 46 mg/dL (ref 0–149)
VLDL Cholesterol Cal: 9 mg/dL (ref 5–40)

## 2023-04-13 LAB — B12 AND FOLATE PANEL
Folate: 6.8 ng/mL (ref >3.0)
Vitamin B-12: 235 pg/mL (ref 232–1245)

## 2023-04-13 LAB — TSH+FREE T4
Free T4: 1.23 ng/dL (ref 0.82–1.77)
TSH: 1.01 u[IU]/mL (ref 0.450–4.500)

## 2023-04-18 ENCOUNTER — Other Ambulatory Visit: Payer: Self-pay

## 2023-05-01 ENCOUNTER — Telehealth: Payer: 59 | Admitting: Nurse Practitioner

## 2023-05-01 DIAGNOSIS — M545 Low back pain, unspecified: Secondary | ICD-10-CM

## 2023-05-01 MED ORDER — CYCLOBENZAPRINE HCL 10 MG PO TABS: 10.0000 mg | ORAL_TABLET | Freq: Three times a day (TID) | ORAL | 0 refills | Status: DC | PRN

## 2023-05-01 NOTE — Progress Notes (Signed)
We are sorry that you are not feeling well.  Here is how we plan to help!  Based on what you have shared with me it looks like you mostly have acute back pain.  Acute back pain is defined as musculoskeletal pain that can resolve in 1-3 weeks with conservative treatment.  I have prescribed Flexeril 10 mg every eight hours as needed which is a muscle relaxer  Some patients experience stomach irritation or in increased heartburn with anti-inflammatory drugs.  Please keep in mind that muscle relaxer's can cause fatigue and should not be taken while at work or driving.  Back pain is very common.  The pain often gets better over time.  The cause of back pain is usually not dangerous.  Most people can learn to manage their back pain on their own.  Home Care Stay active.  Start with short walks on flat ground if you can.  Try to walk farther each day. Do not sit, drive or stand in one place for more than 30 minutes.  Do not stay in bed. Do not avoid exercise or work.  Activity can help your back heal faster. Be careful when you bend or lift an object.  Bend at your knees, keep the object close to you, and do not twist. Sleep on a firm mattress.  Lie on your side, and bend your knees.  If you lie on your back, put a pillow under your knees. Only take medicines as told by your doctor. Put ice on the injured area. Put ice in a plastic bag Place a towel between your skin and the bag Leave the ice on for 15-20 minutes, 3-4 times a day for the first 2-3 days. 210 After that, you can switch between ice and heat packs. Ask your doctor about back exercises or massage. Avoid feeling anxious or stressed.  Find good ways to deal with stress, such as exercise.  Get Help Right Way If: Your pain does not go away with rest or medicine. Your pain does not go away in 1 week. You have new problems. You do not feel well. The pain spreads into your legs. You cannot control when you poop (bowel movement) or pee  (urinate) You feel sick to your stomach (nauseous) or throw up (vomit) You have belly (abdominal) pain. You feel like you may pass out (faint). If you develop a fever.  Make Sure you: Understand these instructions. Will watch your condition Will get help right away if you are not doing well or get worse.  Your e-visit answers were reviewed by a board certified advanced clinical practitioner to complete your personal care plan.  Depending on the condition, your plan could have included both over the counter or prescription medications.  If there is a problem please reply  once you have received a response from your provider.  Your safety is important to [place].  If you have drug allergies check your prescription carefully.    You can use MyChart to ask questions about today's visit, request a non-urgent call back, or ask for a work or school excuse for 24 hours related to this e-Visit. If it has been greater than 24 hours you will need to follow up with your provider, or enter a new e-Visit to address those concerns.  You will get an e-mail in the next two days asking about your experience.  I hope that your e-visit has been valuable and will speed your recovery. Thank you for using e-visits.  Meds ordered this encounter  Medications   cyclobenzaprine (FLEXERIL) 10 MG tablet    Sig: Take 1 tablet (10 mg total) by mouth 3 (three) times daily as needed for muscle spasms.    Dispense:  30 tablet    Refill:  0    I spent approximately 5 minutes reviewing the patient's history, current symptoms and coordinating their care today.

## 2023-05-24 ENCOUNTER — Ambulatory Visit: Payer: 59

## 2023-05-24 ENCOUNTER — Ambulatory Visit: Payer: 59 | Admitting: Podiatry

## 2023-05-24 ENCOUNTER — Ambulatory Visit (INDEPENDENT_AMBULATORY_CARE_PROVIDER_SITE_OTHER): Payer: 59

## 2023-05-24 DIAGNOSIS — M722 Plantar fascial fibromatosis: Secondary | ICD-10-CM

## 2023-05-24 MED ORDER — TRIAMCINOLONE ACETONIDE 10 MG/ML IJ SUSP
10.0000 mg | Freq: Once | INTRAMUSCULAR | Status: AC
  Administered 2023-05-24: 10 mg

## 2023-05-24 NOTE — Progress Notes (Signed)
Subjective:   Patient ID: Nancy Gilmore, female   DOB: 57 y.o.   MRN: [id number]   HPI Chief Complaint  Patient presents with   Foot Pain    C/o pain to right mid arch and heel. Burning and stabbing pain.    57 year old female presents for above concerns.  This started about 4 months ago. She did see Dr. Victorino Dike and was told plantar fasciitis. She has a history of plantar fasciitis in one foot, not sure of which one. She was given an "pill" but it did not help as well as stretching. She was stretching and icing and still having pain. If she sits more than 30 minutes her knee is stiff and her foot hurts. No injuries. No swelling. No numbness or tingling.   She goes to [facility] for arthritis in both knees.    Review of Systems  All other systems reviewed and are negative.   Past Medical History:  Diagnosis Date   Allergy    Arthritis    Cellulitis of leg, right    Complication of anesthesia    nausea and vomiting   Fatty liver    Fibroids    uterine   GERD (gastroesophageal reflux disease) 08/11/2019   Obesity (BMI 30-39.9) 08/11/2019   Vitamin D deficiency disease 08/11/2019    Past Surgical History:  Procedure Laterality Date   ABDOMINAL HYSTERECTOMY     TAH&BSO Age 10 yrs   BIOPSY  10/17/2021   Procedure: BIOPSY;  Surgeon: Charlott Rakes, MD;  Location: [facility];  Service: Endoscopy;;   CESAREAN SECTION     ESOPHAGOGASTRODUODENOSCOPY (EGD) WITH PROPOFOL N/A 10/17/2021   Procedure: ESOPHAGOGASTRODUODENOSCOPY (EGD) WITH PROPOFOL;  Surgeon: Charlott Rakes, MD;  Location: [facility];  Service: Endoscopy;  Laterality: N/A;   FOREIGN BODY REMOVAL N/A 10/17/2021   Procedure: FOREIGN BODY REMOVAL;  Surgeon: Charlott Rakes, MD;  Location: [facility];  Service: Endoscopy;  Laterality: N/A;   NEUROPLASTY / TRANSPOSITION ULNAR NERVE AT ELBOW Bilateral    TOTAL ABDOMINAL HYSTERECTOMY       Current Outpatient Medications:    amLODipine (NORVASC) 5 MG tablet, Take  1 tablet (5 mg total) by mouth daily., Disp: 90 tablet, Rfl: 1   cyclobenzaprine (FLEXERIL) 10 MG tablet, Take 1 tablet (10 mg total) by mouth 3 (three) times daily as needed for muscle spasms., Disp: 30 tablet, Rfl: 0   estradiol (VIVELLE-DOT) 0.1 MG/24HR patch, APPLY 1 PATCH TWICE WEEKLY AS DIRECTED., Disp: 24 patch, Rfl: 12   estradiol (VIVELLE-DOT) 0.1 MG/24HR patch, Place 1 patch (0.1 mg total) onto the skin 2 (two) times a week., Disp: 24 patch, Rfl: 12   meloxicam (MOBIC) 7.5 MG tablet, Take 1 tablet (7.5 mg total) by mouth 2 (two) times for two weeks and then as needed., Disp: 60 tablet, Rfl: 0   telmisartan (MICARDIS) 20 MG tablet, Take 1/2 tablet (10 mg total) by mouth daily., Disp: 45 tablet, Rfl: 3  Allergies  Allergen Reactions   Morphine     Other reaction(s): trouble breathing   Naproxen Hives          Objective:  Physical Exam  General: AAO x3, NAD  Dermatological: Skin is warm, dry and supple bilateral.  There are no open sores, no preulcerative lesions, no rash or signs of infection present.  Vascular: Dorsalis Pedis artery and Posterior Tibial artery pedal pulses are 2/4 bilateral with immedate capillary fill time. There is no pain with calf compression, swelling, warmth, erythema.   Neruologic: Grossly  intact via light touch bilateral.  Negative Tinel sign.  Musculoskeletal:Tenderness to palpation along the plantar medial tubercle of the calcaneus at the insertion of plantar fascia on the right foot. There is no pain along the course of the plantar fascia within the arch of the foot. Plantar fascia appears to be intact. There is no pain with lateral compression of the calcaneus or pain with vibratory sensation. There is no pain along the course or insertion of the achilles tendon. No other areas of tenderness to bilateral lower extremities.  Gait: Unassisted, Nonantalgic.       Assessment:   Right heel pain, Plantar fasciitis     Plan:  -Treatment options  discussed including all alternatives, risks, and complications -Etiology of symptoms were discussed -X-rays were obtained and reviewed with the patient.  3 views of the foot were obtained.  No evidence of acute fracture. -Steroid injection performed.  See procedure note below. -Plantar fascia brace dispensed to help support the plantar fascia facilitate soft tissue healing. -Discussed stretching, icing on a regular basis -Shoes, good arch support.  Procedure: Injection Tendon/Ligament Discussed alternatives, risks, complications and verbal consent was obtained.  Location: Right plantar fascia at the glabrous junction; medial approach. Skin Prep: Alcohol. Injectate: 0.5cc 0.5% marcaine plain, 0.5 cc 2% lidocaine plain and, 1 cc kenalog 10. Disposition: Patient tolerated procedure well. Injection site dressed with a band-aid.  Post-injection care was discussed and return precautions discussed.   Return in about 6 weeks (around 07/05/2023) for right plantar fasciitis .  [name]

## 2023-05-24 NOTE — Patient Instructions (Signed)
If was nice to meet you today. If you have any questions or any further concerns, please feel fee to give me a call. You can call our office at [phone] or please feel fee to send me a message through MyChart.    While at your visit today you received a steroid injection in your foot or ankle to help with your pain. Along with having the steroid medication there is some "numbing" medication in the shot that you received. Due to this you may notice some numbness to the area for the next couple of hours.   I would recommend limiting activity for the next few days to help the steroid injection take affect.    The actually benefit from the steroid injection may take up to 2-7 days to see a difference. You may actually experience a small (as in 10%) INCREASE in pain in the first 24 hours---that is common. It would be best if you can ice the area today and take anti-inflammatory medications (such as Ibuprofen, Motrin, or Aleve) if you are able to take these medications. If you were prescribed another medication to help with the pain go ahead and start that medication today    Things to watch out for that you should contact us or a health care provider urgently would include: 1. Unusual (as in more than 10%) increase in pain 2. New fever > 101.5 3. New swelling or redness of the injected area.  4. Streaking of red lines around the area injected.  If you have any questions or concerns about this, please give our office a call at [phone].     Plantar Fasciitis (Heel Spur Syndrome) with Rehab The plantar fascia is a fibrous, ligament-like, soft-tissue structure that spans the bottom of the foot. Plantar fasciitis is a condition that causes pain in the foot due to inflammation of the tissue. SYMPTOMS  Pain and tenderness on the underneath side of the foot. Pain that worsens with standing or walking. CAUSES  Plantar fasciitis is caused by irritation and injury to the plantar fascia on the  underneath side of the foot. Common mechanisms of injury include: Direct trauma to bottom of the foot. Damage to a small nerve that runs under the foot where the main fascia attaches to the heel bone. Stress placed on the plantar fascia due to bone spurs. RISK INCREASES WITH:  Activities that place stress on the plantar fascia (running, jumping, pivoting, or cutting). Poor strength and flexibility. Improperly fitted shoes. Tight calf muscles. Flat feet. Failure to warm-up properly before activity. Obesity. PREVENTION Warm up and stretch properly before activity. Allow for adequate recovery between workouts. Maintain physical fitness: Strength, flexibility, and endurance. Cardiovascular fitness. Maintain a health body weight. Avoid stress on the plantar fascia. Wear properly fitted shoes, including arch supports for individuals who have flat feet.  PROGNOSIS  If treated properly, then the symptoms of plantar fasciitis usually resolve without surgery. However, occasionally surgery is necessary.  RELATED COMPLICATIONS  Recurrent symptoms that may result in a chronic condition. Problems of the lower back that are caused by compensating for the injury, such as limping. Pain or weakness of the foot during push-off following surgery. Chronic inflammation, scarring, and partial or complete fascia tear, occurring more often from repeated injections.  TREATMENT  Treatment initially involves the use of ice and medication to help reduce pain and inflammation. The use of strengthening and stretching exercises may help reduce pain with activity, especially stretches of the Achilles tendon. These exercises   may be performed at home or with a therapist. Your caregiver may recommend that you use heel cups of arch supports to help reduce stress on the plantar fascia. Occasionally, corticosteroid injections are given to reduce inflammation. If symptoms persist for greater than 6 months despite  non-surgical (conservative), then surgery may be recommended.   MEDICATION  If pain medication is necessary, then nonsteroidal anti-inflammatory medications, such as aspirin and ibuprofen, or other minor pain relievers, such as acetaminophen, are often recommended. Do not take pain medication within 7 days before surgery. Prescription pain relievers may be given if deemed necessary by your caregiver. Use only as directed and only as much as you need. Corticosteroid injections may be given by your caregiver. These injections should be reserved for the most serious cases, because they may only be given a certain number of times.  HEAT AND COLD Cold treatment (icing) relieves pain and reduces inflammation. Cold treatment should be applied for 10 to 15 minutes every 2 to 3 hours for inflammation and pain and immediately after any activity that aggravates your symptoms. Use ice packs or massage the area with a piece of ice (ice massage). Heat treatment may be used prior to performing the stretching and strengthening activities prescribed by your caregiver, physical therapist, or athletic trainer. Use a heat pack or soak the injury in warm water.  SEEK IMMEDIATE MEDICAL CARE IF: Treatment seems to offer no benefit, or the condition worsens. Any medications produce adverse side effects.  EXERCISES- RANGE OF MOTION (ROM) AND STRETCHING EXERCISES - Plantar Fasciitis (Heel Spur Syndrome) These exercises may help you when beginning to rehabilitate your injury. Your symptoms may resolve with or without further involvement from your physician, physical therapist or athletic trainer. While completing these exercises, remember:  Restoring tissue flexibility helps normal motion to return to the joints. This allows healthier, less painful movement and activity. An effective stretch should be held for at least 30 seconds. A stretch should never be painful. You should only feel a gentle lengthening or release in  the stretched tissue.  RANGE OF MOTION - Toe Extension, Flexion Sit with your right / left leg crossed over your opposite knee. Grasp your toes and gently pull them back toward the top of your foot. You should feel a stretch on the bottom of your toes and/or foot. Hold this stretch for 10 seconds. Now, gently pull your toes toward the bottom of your foot. You should feel a stretch on the top of your toes and or foot. Hold this stretch for 10 seconds. Repeat  times. Complete this stretch 3 times per day.   RANGE OF MOTION - Ankle Dorsiflexion, Active Assisted Remove shoes and sit on a chair that is preferably not on a carpeted surface. Place right / left foot under knee. Extend your opposite leg for support. Keeping your heel down, slide your right / left foot back toward the chair until you feel a stretch at your ankle or calf. If you do not feel a stretch, slide your bottom forward to the edge of the chair, while still keeping your heel down. Hold this stretch for 10 seconds. Repeat 3 times. Complete this stretch 2 times per day.   STRETCH  Gastroc, Standing Place hands on wall. Extend right / left leg, keeping the front knee somewhat bent. Slightly point your toes inward on your back foot. Keeping your right / left heel on the floor and your knee straight, shift your weight toward the wall, not allowing your   back to arch. You should feel a gentle stretch in the right / left calf. Hold this position for 10 seconds. Repeat 3 times. Complete this stretch 2 times per day.  STRETCH  Soleus, Standing Place hands on wall. Extend right / left leg, keeping the other knee somewhat bent. Slightly point your toes inward on your back foot. Keep your right / left heel on the floor, bend your back knee, and slightly shift your weight over the back leg so that you feel a gentle stretch deep in your back calf. Hold this position for 10 seconds. Repeat 3 times. Complete this stretch 2 times per  day.  STRETCH  Gastrocsoleus, Standing  Note: This exercise can place a lot of stress on your foot and ankle. Please complete this exercise only if specifically instructed by your caregiver.  Place the ball of your right / left foot on a step, keeping your other foot firmly on the same step. Hold on to the wall or a rail for balance. Slowly lift your other foot, allowing your body weight to press your heel down over the edge of the step. You should feel a stretch in your right / left calf. Hold this position for 10 seconds. Repeat this exercise with a slight bend in your right / left knee. Repeat 3 times. Complete this stretch 2 times per day.   STRENGTHENING EXERCISES - Plantar Fasciitis (Heel Spur Syndrome)  These exercises may help you when beginning to rehabilitate your injury. They may resolve your symptoms with or without further involvement from your physician, physical therapist or athletic trainer. While completing these exercises, remember:  Muscles can gain both the endurance and the strength needed for everyday activities through controlled exercises. Complete these exercises as instructed by your physician, physical therapist or athletic trainer. Progress the resistance and repetitions only as guided.  STRENGTH - Towel Curls Sit in a chair positioned on a non-carpeted surface. Place your foot on a towel, keeping your heel on the floor. Pull the towel toward your heel by only curling your toes. Keep your heel on the floor. Repeat 3 times. Complete this exercise 2 times per day.  STRENGTH - Ankle Inversion Secure one end of a rubber exercise band/tubing to a fixed object (table, pole). Loop the other end around your foot just before your toes. Place your fists between your knees. This will focus your strengthening at your ankle. Slowly, pull your big toe up and in, making sure the band/tubing is positioned to resist the entire motion. Hold this position for 10 seconds. Have  your muscles resist the band/tubing as it slowly pulls your foot back to the starting position. Repeat 3 times. Complete this exercises 2 times per day.  Document Released: 10/29/2005 Document Revised: 01/21/2012 Document Reviewed: 02/10/2009 [employer] Patient Information 2014 [employer].  

## 2023-06-06 DIAGNOSIS — M1712 Unilateral primary osteoarthritis, left knee: Secondary | ICD-10-CM | POA: Diagnosis not present

## 2023-06-10 ENCOUNTER — Other Ambulatory Visit: Payer: Self-pay

## 2023-06-10 ENCOUNTER — Other Ambulatory Visit (HOSPITAL_COMMUNITY): Payer: Self-pay

## 2023-06-13 ENCOUNTER — Other Ambulatory Visit (HOSPITAL_COMMUNITY): Payer: Self-pay

## 2023-06-13 ENCOUNTER — Other Ambulatory Visit: Payer: Self-pay

## 2023-06-13 MED ORDER — ESTRADIOL 0.1 MG/24HR TD PTTW: MEDICATED_PATCH | TRANSDERMAL | 12 refills | Status: AC

## 2023-06-14 ENCOUNTER — Other Ambulatory Visit (HOSPITAL_COMMUNITY): Payer: Self-pay

## 2023-06-20 DIAGNOSIS — M170 Bilateral primary osteoarthritis of knee: Secondary | ICD-10-CM | POA: Diagnosis not present

## 2023-06-27 DIAGNOSIS — M1712 Unilateral primary osteoarthritis, left knee: Secondary | ICD-10-CM | POA: Diagnosis not present

## 2023-07-04 DIAGNOSIS — M1712 Unilateral primary osteoarthritis, left knee: Secondary | ICD-10-CM | POA: Diagnosis not present

## 2023-07-07 ENCOUNTER — Other Ambulatory Visit (HOSPITAL_COMMUNITY): Payer: Self-pay

## 2023-07-12 ENCOUNTER — Ambulatory Visit: Payer: 59 | Admitting: Internal Medicine

## 2023-08-05 ENCOUNTER — Other Ambulatory Visit: Payer: Self-pay

## 2023-08-26 ENCOUNTER — Ambulatory Visit: Payer: 59 | Admitting: Internal Medicine

## 2023-08-26 ENCOUNTER — Encounter: Payer: Self-pay | Admitting: Internal Medicine

## 2023-08-26 VITALS — BP 139/88 | HR 63 | Ht 65.0 in | Wt 228.1 lb

## 2023-08-26 DIAGNOSIS — E669 Obesity, unspecified: Secondary | ICD-10-CM

## 2023-08-26 DIAGNOSIS — I10 Essential (primary) hypertension: Secondary | ICD-10-CM | POA: Diagnosis not present

## 2023-08-26 NOTE — Patient Instructions (Signed)
It was a pleasure to see you today.  Thank you for giving [place] the opportunity to be involved in your care.  Below is a brief recap of your visit and next steps.  We will plan to see you again in 6 months.  Summary No medication changes today We will plan for follow up in 6 months

## 2023-08-26 NOTE — Assessment & Plan Note (Signed)
BP is mildly elevated today, however she reports she has not taken telmisartan or amlodipine since end of last week while caring for her granddaughter.  BP has previously been well-controlled on telmisartan 10 mg daily and amlodipine 5 mg daily.  No medication changes have been made today.  We reviewed the importance of taking her antihypertensive medications as prescribed.

## 2023-08-26 NOTE — Assessment & Plan Note (Signed)
BMI 37.9.  She has gained 10 pounds since her last appointment.  Not currently practicing any lifestyle habits aimed at weight loss.  States that she went through a bad spell within the last 1-2 months. -No medication prescribed today.  Lifestyle changes aimed at weight loss were reviewed once again.  We will tentatively plan for follow-up in 6 months.

## 2023-08-26 NOTE — Progress Notes (Signed)
Established Patient Office Visit  Subjective   Patient ID: Nancy Gilmore, female    DOB: [birth year]  Age: 57 y.o. MRN: [id number]  Chief Complaint  Patient presents with   Hypertension    F/u   Ms. Nancy Gilmore returns to care today for routine follow-up.  She was last evaluated by me on 5/31 for her annual exam.  No medication changes were made at that time.  We reviewed lifestyle modifications aimed at weight loss and tentatively planned for follow-up in 3 months for reassessment.  In the interim she has been evaluated by podiatry for right foot pain.  There have otherwise been no acute interval events. Ms. Nancy Gilmore endorses sinus congestion but is otherwise asymptomatic and has no acute concerns or discuss.  Past Medical History:  Diagnosis Date   Allergy    Arthritis    Cellulitis of leg, right    Complication of anesthesia    nausea and vomiting   Fatty liver    Fibroids    uterine   GERD (gastroesophageal reflux disease) 08/11/2019   Obesity (BMI 30-39.9) 08/11/2019   Vitamin D deficiency disease 08/11/2019   Past Surgical History:  Procedure Laterality Date   ABDOMINAL HYSTERECTOMY     TAH&BSO Age 84 yrs   BIOPSY  10/17/2021   Procedure: BIOPSY;  Surgeon: Charlott Rakes, MD;  Location: [facility];  Service: Endoscopy;;   CESAREAN SECTION     ESOPHAGOGASTRODUODENOSCOPY (EGD) WITH PROPOFOL N/A 10/17/2021   Procedure: ESOPHAGOGASTRODUODENOSCOPY (EGD) WITH PROPOFOL;  Surgeon: Charlott Rakes, MD;  Location: [facility];  Service: Endoscopy;  Laterality: N/A;   FOREIGN BODY REMOVAL N/A 10/17/2021   Procedure: FOREIGN BODY REMOVAL;  Surgeon: Charlott Rakes, MD;  Location: [facility];  Service: Endoscopy;  Laterality: N/A;   NEUROPLASTY / TRANSPOSITION ULNAR NERVE AT ELBOW Bilateral    TOTAL ABDOMINAL HYSTERECTOMY     Social History   Tobacco Use   Smoking status: Former    Current packs/day: 0.00    Types: Cigarettes    Quit date: 11/12/2006    Years since  quitting: 16.7   Smokeless tobacco: Never   Tobacco comments:    Started smoking at age 57 , quit in 2008. Smoked 2 packs a day.   Vaping Use   Vaping status: Never Used  Substance Use Topics   Alcohol use: No   Drug use: No   Family History  Problem Relation Age of Onset   Hyperlipidemia Mother    Hypertension Mother    Stroke Mother    Depression Mother    Obesity Mother    Alcohol abuse Father    Obesity Father    Lung cancer Father    Obesity Brother    Hypertension Brother    Sleep apnea Brother    Breast cancer Maternal Aunt    Uterine cancer Paternal Grandmother    Colon cancer Neg Hx    Liver disease Neg Hx    Allergies  Allergen Reactions   Morphine     Other reaction(s): trouble breathing   Naproxen Hives   Review of Systems  HENT:  Positive for congestion.   All other systems reviewed and are negative.    Objective:     BP 139/88 (BP Location: Left Arm, Patient Position: Sitting, Cuff Size: Large)   Pulse 63   Ht 5\' 5"  (1.651 m)   Wt 228 lb 1.3 oz (103.5 kg)   SpO2 97%   BMI 37.95 kg/m  BP Readings from Last 3  Encounters:  08/26/23 139/88  04/12/23 112/62  12/17/22 125/74   Physical Exam Constitutional:      General: She is not in acute distress.    Appearance: Normal appearance. She is obese. She is not toxic-appearing.  HENT:     Head: Normocephalic and atraumatic.     Right Ear: External ear normal.     Left Ear: External ear normal.     Nose: Congestion present. No rhinorrhea.     Mouth/Throat:     Mouth: Mucous membranes are moist.     Pharynx: Oropharynx is clear. No oropharyngeal exudate or posterior oropharyngeal erythema.  Eyes:     General: No scleral icterus.    Extraocular Movements: Extraocular movements intact.     Conjunctiva/sclera: Conjunctivae normal.     Pupils: Pupils are equal, round, and reactive to light.  Cardiovascular:     Rate and Rhythm: Normal rate and regular rhythm.     Pulses: Normal pulses.     Heart  sounds: Normal heart sounds. No murmur heard.    No friction rub. No gallop.  Pulmonary:     Effort: Pulmonary effort is normal.     Breath sounds: Normal breath sounds. No wheezing, rhonchi or rales.  Abdominal:     General: Abdomen is flat. Bowel sounds are normal. There is no distension.     Palpations: Abdomen is soft.     Tenderness: There is no abdominal tenderness.  Musculoskeletal:        General: No swelling. Normal range of motion.     Cervical back: Normal range of motion.     Right lower leg: No edema.     Left lower leg: No edema.  Lymphadenopathy:     Cervical: No cervical adenopathy.  Skin:    General: Skin is warm and dry.     Capillary Refill: Capillary refill takes less than 2 seconds.     Coloration: Skin is not jaundiced.  Neurological:     General: No focal deficit present.     Mental Status: She is alert and oriented to person, place, and time.  Psychiatric:        Mood and Affect: Mood normal.        Behavior: Behavior normal.   Last CBC Lab Results  Component Value Date   WBC 5.7 04/12/2023   HGB 13.4 04/12/2023   HCT 40.2 04/12/2023   MCV 94 04/12/2023   MCH 31.2 04/12/2023   RDW 12.8 04/12/2023   PLT 239 04/12/2023   Last metabolic panel Lab Results  Component Value Date   GLUCOSE 79 04/12/2023   NA 139 04/12/2023   K 4.6 04/12/2023   CL 103 04/12/2023   CO2 22 04/12/2023   BUN 16 04/12/2023   CREATININE 0.74 04/12/2023   EGFR 94 04/12/2023   CALCIUM 9.1 04/12/2023   PHOS 3.4 11/14/2022   PROT 6.8 04/12/2023   ALBUMIN 4.4 04/12/2023   LABGLOB 2.4 04/12/2023   AGRATIO 1.8 04/12/2023   BILITOT 0.5 04/12/2023   ALKPHOS 53 04/12/2023   AST 16 04/12/2023   ALT 14 04/12/2023   ANIONGAP 9 11/14/2022   Last lipids Lab Results  Component Value Date   CHOL 181 04/12/2023   HDL 79 04/12/2023   LDLCALC 93 04/12/2023   TRIG 46 04/12/2023   CHOLHDL 2.3 04/12/2023   Last hemoglobin A1c Lab Results  Component Value Date   HGBA1C 5.2  04/12/2023   Last thyroid functions Lab Results  Component Value Date  TSH 1.010 04/12/2023   T3TOTAL 122 04/20/2020   T4TOTAL 7.4 04/20/2020   Last vitamin D Lab Results  Component Value Date   VD25OH 30.7 04/12/2023   Last vitamin B12 and Folate Lab Results  Component Value Date   VITAMINB12 235 04/12/2023   FOLATE 6.8 04/12/2023   The 10-year ASCVD risk score (Arnett DK, et al., 2019) is: 2.6%    Assessment & Plan:   Problem List Items Addressed This Visit       Hypertension - Primary    BP is mildly elevated today, however she reports she has not taken telmisartan or amlodipine since end of last week while caring for her granddaughter.  BP has previously been well-controlled on telmisartan 10 mg daily and amlodipine 5 mg daily.  No medication changes have been made today.  We reviewed the importance of taking her antihypertensive medications as prescribed.      Obesity (BMI 30-39.9)    BMI 37.9.  She has gained 10 pounds since her last appointment.  Not currently practicing any lifestyle habits aimed at weight loss.  States that she went through a bad spell within the last 1-2 months. -No medication prescribed today.  Lifestyle changes aimed at weight loss were reviewed once again.  We will tentatively plan for follow-up in 6 months.       Return in about 6 months (around 02/24/2024).    Billie Lade, MD

## 2023-09-11 ENCOUNTER — Telehealth: Payer: Self-pay | Admitting: Internal Medicine

## 2023-09-11 NOTE — Telephone Encounter (Signed)
FYI patient called asked to speak to nurse about her medicine, she said will send a message through [place].

## 2023-09-13 DIAGNOSIS — N189 Chronic kidney disease, unspecified: Secondary | ICD-10-CM | POA: Diagnosis not present

## 2023-09-13 DIAGNOSIS — Z6837 Body mass index (BMI) 37.0-37.9, adult: Secondary | ICD-10-CM | POA: Diagnosis not present

## 2023-09-13 DIAGNOSIS — N181 Chronic kidney disease, stage 1: Secondary | ICD-10-CM | POA: Diagnosis not present

## 2023-09-13 DIAGNOSIS — K760 Fatty (change of) liver, not elsewhere classified: Secondary | ICD-10-CM | POA: Diagnosis not present

## 2023-09-13 DIAGNOSIS — I129 Hypertensive chronic kidney disease with stage 1 through stage 4 chronic kidney disease, or unspecified chronic kidney disease: Secondary | ICD-10-CM | POA: Diagnosis not present

## 2023-09-13 DIAGNOSIS — K219 Gastro-esophageal reflux disease without esophagitis: Secondary | ICD-10-CM | POA: Diagnosis not present

## 2023-09-13 DIAGNOSIS — R809 Proteinuria, unspecified: Secondary | ICD-10-CM | POA: Diagnosis not present

## 2023-09-23 DIAGNOSIS — N182 Chronic kidney disease, stage 2 (mild): Secondary | ICD-10-CM | POA: Diagnosis not present

## 2023-09-23 DIAGNOSIS — Z6837 Body mass index (BMI) 37.0-37.9, adult: Secondary | ICD-10-CM | POA: Diagnosis not present

## 2023-09-23 DIAGNOSIS — I129 Hypertensive chronic kidney disease with stage 1 through stage 4 chronic kidney disease, or unspecified chronic kidney disease: Secondary | ICD-10-CM | POA: Diagnosis not present

## 2023-09-23 DIAGNOSIS — R809 Proteinuria, unspecified: Secondary | ICD-10-CM | POA: Diagnosis not present

## 2023-09-30 ENCOUNTER — Other Ambulatory Visit: Payer: Self-pay | Admitting: Internal Medicine

## 2023-09-30 ENCOUNTER — Other Ambulatory Visit: Payer: Self-pay

## 2023-09-30 DIAGNOSIS — I10 Essential (primary) hypertension: Secondary | ICD-10-CM

## 2023-09-30 MED ORDER — AMLODIPINE BESYLATE 5 MG PO TABS
5.0000 mg | ORAL_TABLET | Freq: Every day | ORAL | 1 refills | Status: DC
  Filled 2023-09-30: qty 90, 90d supply, fill #0
  Filled 2024-01-15: qty 90, 90d supply, fill #1

## 2023-10-01 ENCOUNTER — Other Ambulatory Visit: Payer: Self-pay

## 2023-10-01 ENCOUNTER — Encounter: Payer: Self-pay | Admitting: Pharmacist

## 2023-10-02 ENCOUNTER — Other Ambulatory Visit: Payer: Self-pay | Admitting: Internal Medicine

## 2023-10-02 DIAGNOSIS — Z1231 Encounter for screening mammogram for malignant neoplasm of breast: Secondary | ICD-10-CM

## 2023-10-03 ENCOUNTER — Other Ambulatory Visit (HOSPITAL_COMMUNITY): Payer: Self-pay

## 2023-11-12 ENCOUNTER — Other Ambulatory Visit (HOSPITAL_COMMUNITY): Payer: Self-pay

## 2023-11-12 ENCOUNTER — Telehealth: Payer: 59 | Admitting: Physician Assistant

## 2023-11-12 ENCOUNTER — Other Ambulatory Visit: Payer: Self-pay

## 2023-11-12 DIAGNOSIS — R6889 Other general symptoms and signs: Secondary | ICD-10-CM

## 2023-11-12 MED ORDER — PROMETHAZINE-DM 6.25-15 MG/5ML PO SYRP
5.0000 mL | ORAL_SOLUTION | Freq: Four times a day (QID) | ORAL | 0 refills | Status: DC | PRN
  Filled 2023-11-12: qty 118, 6d supply, fill #0

## 2023-11-12 MED ORDER — FLUTICASONE PROPIONATE 50 MCG/ACT NA SUSP
2.0000 | Freq: Every day | NASAL | 0 refills | Status: DC
  Filled 2023-11-12: qty 16, 30d supply, fill #0

## 2023-11-12 NOTE — Progress Notes (Signed)
 E visit for Flu like symptoms   We are sorry that you are not feeling well.  Here is how we plan to help! Based on what you have shared with me it looks like you may have flu-like symptoms that should be watched but do not seem to indicate anti-viral treatment.  Influenza or "the flu" is   an infection caused by a respiratory virus. The flu virus is highly contagious and persons who did not receive their yearly flu vaccination may "catch" the flu from close contact.  We have anti-viral medications to treat the viruses that cause this infection. They are not a "cure" and only shorten the course of the infection. These prescriptions are most effective when they are given within the first 2 days of "flu" symptoms. Antiviral medication are indicated if you have a high risk of complications from the flu.   Based upon your symptoms and potential risk factors I recommend that you follow the flu symptoms recommendation that I have listed below.  Based on what you have shared with me, it looks like you may have a viral upper respiratory infection.  Upper respiratory infections are caused by a large number of viruses; however, rhinovirus is the most common cause.   Symptoms vary from person to person, with common symptoms including sore throat, cough, fatigue or lack of energy and feeling of general discomfort.  A low-grade fever of up to 100.4 may present, but is often uncommon.  Symptoms vary however, and are closely related to a person's age or underlying illnesses.  The most common symptoms associated with an upper respiratory infection are nasal discharge or congestion, cough, sneezing, headache and pressure in the ears and face.  These symptoms usually persist for about 3 to 10 days, but can last up to 2 weeks.  It is important to know that upper respiratory infections do not cause serious illness or complications in most cases.    Upper respiratory infections can be transmitted from person to person,  with the most common method of transmission being a person's hands.  The virus is able to live on the skin and can infect other persons for up to 2 hours after direct contact.  Also, these can be transmitted when someone coughs or sneezes; thus, it is important to cover the mouth to reduce this risk.  To keep the spread of the illness at bay, good hand hygiene is very important.  This is an infection that is most likely caused by a virus. There are no specific treatments other than to help you with the symptoms until the infection runs its course.  We are sorry you are not feeling well.  Here is how we plan to help!  For nasal congestion, you may use an oral decongestants such as Mucinex D or if you have glaucoma or high blood pressure use plain Mucinex.  Saline nasal spray or nasal drops can help and can safely be used as often as needed for congestion.  For your congestion, I have prescribed Fluticasone  nasal spray one spray in each nostril twice a day  If you do not have a history of heart disease, hypertension, diabetes or thyroid disease, prostate/bladder issues or glaucoma, you may also use Sudafed to treat nasal congestion.  It is highly recommended that you consult with a pharmacist or your primary care physician to ensure this medication is safe for you to take.     If you have a cough, you may use cough suppressants such  as Delsym and Robitussin.  If you have glaucoma or high blood pressure, you can also use Coricidin HBP.   For cough I have prescribed for you Promethazine  DM cough syrup Take 5mL every 6 hours as needed for cough.  If you have a sore or scratchy throat, use a saltwater gargle-  to  teaspoon of salt dissolved in a 4-ounce to 8-ounce glass of warm water.  Gargle the solution for approximately 15-30 seconds and then spit.  It is important not to swallow the solution.  You can also use throat lozenges/cough drops and Chloraseptic spray to help with throat pain or discomfort.  Warm  or cold liquids can also be helpful in relieving throat pain.  For headache, pain or general discomfort, you can use Ibuprofen or Tylenol as directed.   Some authorities believe that zinc sprays or the use of Echinacea may shorten the course of your symptoms.  ANYONE WHO HAS FLU SYMPTOMS SHOULD: Stay home. The flu is highly contagious and going out or to work exposes others! Be sure to drink plenty of fluids. Water is fine as well as fruit juices, sodas and electrolyte beverages. You may want to stay away from caffeine or alcohol. If you are nauseated, try taking small sips of liquids. How do you know if you are getting enough fluid? Your urine should be a pale yellow or almost colorless. Get rest. Taking a steamy shower or using a humidifier may help nasal congestion and ease sore throat pain. Using a saline nasal spray works much the same way. Cough drops, hard candies and sore throat lozenges may ease your cough. Line up a caregiver. Have someone check on you regularly.   GET HELP RIGHT AWAY IF: You cannot keep down liquids or your medications. You become short of breath Your fell like you are going to pass out or loose consciousness. Your symptoms persist after you have completed your treatment plan MAKE SURE YOU  Understand these instructions. Will watch your condition. Will get help right away if you are not doing well or get worse.  Your e-visit answers were reviewed by a board certified advanced clinical practitioner to complete your personal care plan.  Depending on the condition, your plan could have included both over the counter or prescription medications.  If there is a problem please reply  once you have received a response from your provider.  Your safety is important to us .  If you have drug allergies check your prescription carefully.    You can use MyChart to ask questions about today's visit, request a non-urgent call back, or ask for a work or school excuse for 24  hours related to this e-Visit. If it has been greater than 24 hours you will need to follow up with your provider, or enter a new e-Visit to address those concerns.  You will get an e-mail in the next two days asking about your experience.  I hope that your e-visit has been valuable and will speed your recovery. Thank you for using e-visits.   I have spent 5 minutes in review of e-visit questionnaire, review and updating patient chart, medical decision making and response to patient.   Delon CHRISTELLA Dickinson, PA-C

## 2023-11-14 ENCOUNTER — Other Ambulatory Visit: Payer: Self-pay

## 2023-11-14 ENCOUNTER — Other Ambulatory Visit (HOSPITAL_COMMUNITY): Payer: Self-pay

## 2023-12-06 ENCOUNTER — Telehealth: Payer: Self-pay | Admitting: Internal Medicine

## 2023-12-06 NOTE — Telephone Encounter (Signed)
Patient came by office in regard to FMLA forms   Patient is needing date change on forms   Needing current date.  Patient wants a call back in regards

## 2023-12-09 NOTE — Telephone Encounter (Signed)
Spoke to patient and told her we would need new forms for the new year , that we would not just change the dates

## 2023-12-09 NOTE — Telephone Encounter (Signed)
Spoke with patient she has no new Fmla forms. Is requesting that provider changes dates from 2024 forms to 2025.  Advised patient that office will need new forms to be completed for 2025.  Patient wants a call back in regard.

## 2023-12-22 ENCOUNTER — Other Ambulatory Visit: Payer: Self-pay

## 2024-01-13 ENCOUNTER — Ambulatory Visit
Admission: RE | Admit: 2024-01-13 | Discharge: 2024-01-13 | Disposition: A | Discharge status: Home / Self Care | Payer: 59 | Source: Ambulatory Visit | Attending: Internal Medicine | Admitting: Internal Medicine

## 2024-01-13 DIAGNOSIS — Z1231 Encounter for screening mammogram for malignant neoplasm of breast: Secondary | ICD-10-CM | POA: Diagnosis not present

## 2024-01-15 ENCOUNTER — Other Ambulatory Visit (HOSPITAL_COMMUNITY): Payer: Self-pay

## 2024-01-24 ENCOUNTER — Other Ambulatory Visit (HOSPITAL_COMMUNITY): Payer: Self-pay

## 2024-02-20 ENCOUNTER — Other Ambulatory Visit (HOSPITAL_COMMUNITY): Payer: Self-pay

## 2024-02-20 MED ORDER — TELMISARTAN 20 MG PO TABS
10.0000 mg | ORAL_TABLET | Freq: Every day | ORAL | 0 refills | Status: DC
  Filled 2024-02-20: qty 45, 90d supply, fill #0
  Filled 2024-02-25: qty 30, 60d supply, fill #0
  Filled 2024-02-25: qty 45, 90d supply, fill #0
  Filled 2024-04-28: qty 30, 60d supply, fill #1
  Filled 2024-06-22: qty 30, 60d supply, fill #2

## 2024-02-24 ENCOUNTER — Ambulatory Visit: Payer: 59 | Admitting: Internal Medicine

## 2024-02-24 ENCOUNTER — Encounter: Payer: Self-pay | Admitting: Internal Medicine

## 2024-02-24 VITALS — BP 122/70 | HR 75 | Ht 65.0 in | Wt 225.0 lb

## 2024-02-24 DIAGNOSIS — E669 Obesity, unspecified: Secondary | ICD-10-CM

## 2024-02-24 DIAGNOSIS — K219 Gastro-esophageal reflux disease without esophagitis: Secondary | ICD-10-CM

## 2024-02-24 DIAGNOSIS — I10 Essential (primary) hypertension: Secondary | ICD-10-CM | POA: Diagnosis not present

## 2024-02-24 DIAGNOSIS — N181 Chronic kidney disease, stage 1: Secondary | ICD-10-CM | POA: Diagnosis not present

## 2024-02-24 NOTE — Assessment & Plan Note (Addendum)
Remains asymptomatic off medication.

## 2024-02-24 NOTE — Progress Notes (Signed)
 Established Patient Office Visit  Subjective   Patient ID: Nancy Gilmore, female    DOB: Mar 31, 1966  Age: 58 y.o. MRN: [id number]  Chief Complaint  Patient presents with   Care Management    Six month follow up   Ms. Weide return to care today for routine follow-up.  She was last evaluated by me in October 2024.  No medication changes were made at that time and 89-month follow-up was arranged.  There have been no acute interval events.  Today she endorses chronic left knee pain but otherwise reports feeling well.  She does not have any acute concerns to discuss.  Past Medical History:  Diagnosis Date   Allergy    Arthritis    Cellulitis of leg, right    Complication of anesthesia    nausea and vomiting   Fatty liver    Fibroids    uterine   GERD (gastroesophageal reflux disease) 08/11/2019   Obesity (BMI 30-39.9) 08/11/2019   Vitamin D deficiency disease 08/11/2019   Past Surgical History:  Procedure Laterality Date   ABDOMINAL HYSTERECTOMY     TAH&BSO Age 31 yrs   BIOPSY  10/17/2021   Procedure: BIOPSY;  Surgeon: Charlott Rakes, MD;  Location: [facility];  Service: Endoscopy;;   CESAREAN SECTION     ESOPHAGOGASTRODUODENOSCOPY (EGD) WITH PROPOFOL N/A 10/17/2021   Procedure: ESOPHAGOGASTRODUODENOSCOPY (EGD) WITH PROPOFOL;  Surgeon: Charlott Rakes, MD;  Location: [facility];  Service: Endoscopy;  Laterality: N/A;   FOREIGN BODY REMOVAL N/A 10/17/2021   Procedure: FOREIGN BODY REMOVAL;  Surgeon: Charlott Rakes, MD;  Location: [facility];  Service: Endoscopy;  Laterality: N/A;   NEUROPLASTY / TRANSPOSITION ULNAR NERVE AT ELBOW Bilateral    TOTAL ABDOMINAL HYSTERECTOMY     Social History   Tobacco Use   Smoking status: Former    Current packs/day: 0.00    Types: Cigarettes    Quit date: 11/12/2006    Years since quitting: 17.2   Smokeless tobacco: Never   Tobacco comments:    Started smoking at age 35 , quit in 2008. Smoked 2 packs a day.   Vaping Use    Vaping status: Never Used  Substance Use Topics   Alcohol use: No   Drug use: No   Family History  Problem Relation Age of Onset   Hyperlipidemia Mother    Hypertension Mother    Stroke Mother    Depression Mother    Obesity Mother    Alcohol abuse Father    Obesity Father    Lung cancer Father    Obesity Brother    Hypertension Brother    Sleep apnea Brother    Breast cancer Maternal Aunt    Uterine cancer Paternal Grandmother    Colon cancer Neg Hx    Liver disease Neg Hx    Allergies  Allergen Reactions   Morphine     Other reaction(s): trouble breathing   Naproxen Hives   Review of Systems  Constitutional:  Negative for chills and fever.  HENT:  Negative for sore throat.   Respiratory:  Negative for cough and shortness of breath.   Cardiovascular:  Negative for chest pain, palpitations and leg swelling.  Gastrointestinal:  Negative for abdominal pain, blood in stool, constipation, diarrhea, nausea and vomiting.  Genitourinary:  Negative for dysuria and hematuria.  Musculoskeletal:  Negative for myalgias.  Skin:  Negative for itching and rash.  Neurological:  Negative for dizziness and headaches.  Psychiatric/Behavioral:  Negative for depression and suicidal  ideas.      Objective:     BP 122/70   Pulse 75   Ht 5\' 5"  (1.651 m)   Wt 225 lb (102.1 kg)   SpO2 95%   BMI 37.44 kg/m  BP Readings from Last 3 Encounters:  02/24/24 122/70  08/26/23 139/88  04/12/23 112/62   Physical Exam Vitals reviewed.  Constitutional:      General: She is not in acute distress.    Appearance: Normal appearance. She is obese. She is not toxic-appearing.  HENT:     Head: Normocephalic and atraumatic.     Right Ear: External ear normal.     Left Ear: External ear normal.     Nose: Nose normal. No congestion or rhinorrhea.     Mouth/Throat:     Mouth: Mucous membranes are moist.     Pharynx: Oropharynx is clear. No oropharyngeal exudate or posterior oropharyngeal  erythema.  Eyes:     General: No scleral icterus.    Extraocular Movements: Extraocular movements intact.     Conjunctiva/sclera: Conjunctivae normal.     Pupils: Pupils are equal, round, and reactive to light.  Cardiovascular:     Rate and Rhythm: Normal rate and regular rhythm.     Pulses: Normal pulses.     Heart sounds: Normal heart sounds. No murmur heard.    No friction rub. No gallop.  Pulmonary:     Effort: Pulmonary effort is normal.     Breath sounds: Normal breath sounds. No wheezing, rhonchi or rales.  Abdominal:     General: Abdomen is flat. Bowel sounds are normal. There is no distension.     Palpations: Abdomen is soft.     Tenderness: There is no abdominal tenderness.  Musculoskeletal:        General: No swelling. Normal range of motion.     Cervical back: Normal range of motion.     Right lower leg: No edema.     Left lower leg: No edema.  Lymphadenopathy:     Cervical: No cervical adenopathy.  Skin:    General: Skin is warm and dry.     Capillary Refill: Capillary refill takes less than 2 seconds.     Coloration: Skin is not jaundiced.  Neurological:     General: No focal deficit present.     Mental Status: She is alert and oriented to person, place, and time.  Psychiatric:        Mood and Affect: Mood normal.        Behavior: Behavior normal.   Last CBC Lab Results  Component Value Date   WBC 5.7 04/12/2023   HGB 13.4 04/12/2023   HCT 40.2 04/12/2023   MCV 94 04/12/2023   MCH 31.2 04/12/2023   RDW 12.8 04/12/2023   PLT 239 04/12/2023   Last metabolic panel Lab Results  Component Value Date   GLUCOSE 79 04/12/2023   NA 139 04/12/2023   K 4.6 04/12/2023   CL 103 04/12/2023   CO2 22 04/12/2023   BUN 16 04/12/2023   CREATININE 0.74 04/12/2023   EGFR 94 04/12/2023   CALCIUM 9.1 04/12/2023   PHOS 3.4 11/14/2022   PROT 6.8 04/12/2023   ALBUMIN 4.4 04/12/2023   LABGLOB 2.4 04/12/2023   AGRATIO 1.8 04/12/2023   BILITOT 0.5 04/12/2023    ALKPHOS 53 04/12/2023   AST 16 04/12/2023   ALT 14 04/12/2023   ANIONGAP 9 11/14/2022   Last lipids Lab Results  Component Value Date  CHOL 181 04/12/2023   HDL 79 04/12/2023   LDLCALC 93 04/12/2023   TRIG 46 04/12/2023   CHOLHDL 2.3 04/12/2023   Last hemoglobin A1c Lab Results  Component Value Date   HGBA1C 5.2 04/12/2023   Last thyroid functions Lab Results  Component Value Date   TSH 1.010 04/12/2023   T3TOTAL 122 04/20/2020   T4TOTAL 7.4 04/20/2020   Last vitamin D Lab Results  Component Value Date   VD25OH 30.7 04/12/2023   Last vitamin B12 and Folate Lab Results  Component Value Date   VITAMINB12 235 04/12/2023   FOLATE 6.8 04/12/2023   The 10-year ASCVD risk score (Arnett DK, et al., 2019) is: 2.3%    Assessment & Plan:   Problem List Items Addressed This Visit       Hypertension - Primary   Remains adequately controlled with current antihypertensive regimen of amlodipine 5 mg daily and telmisartan 10 mg daily.  No medication changes are indicated today.      GERD (gastroesophageal reflux disease)   Remains asymptomatic off medication.      Chronic kidney disease (CKD), stage I   Stable.  Followed by nephrology (Dr. Carrolyn Clan).  Repeat labs and follow-up are scheduled for next month.  Currently on telmisartan.      Obesity (BMI 30-39.9)   Current weight 225 pounds.  BMI 37.4.  Her weight is stable since her last appointment.  She remains focused on lifestyle modifications aimed at weight loss.  She would like to start medication for weight loss when covered by insurance, but for now will continue to focus largely on dietary changes.      Return in about 6 months (around 08/25/2024).   Tobi Fortes, MD

## 2024-02-24 NOTE — Assessment & Plan Note (Addendum)
 Current weight 225 pounds.  BMI 37.4.  Her weight is stable since her last appointment.  She remains focused on lifestyle modifications aimed at weight loss.  She would like to start medication for weight loss when covered by insurance, but for now will continue to focus largely on dietary changes.

## 2024-02-24 NOTE — Patient Instructions (Signed)
It was a pleasure to see you today.  Thank you for giving [place] the opportunity to be involved in your care.  Below is a brief recap of your visit and next steps.  We will plan to see you again in 6 months  Summary No medication changes today Follow up in 6 months

## 2024-02-24 NOTE — Assessment & Plan Note (Signed)
 Remains adequately controlled with current antihypertensive regimen of amlodipine 5 mg daily and telmisartan 10 mg daily.  No medication changes are indicated today.

## 2024-02-24 NOTE — Assessment & Plan Note (Signed)
 Stable.  Followed by nephrology (Dr. Carrolyn Clan).  Repeat labs and follow-up are scheduled for next month.  Currently on telmisartan.

## 2024-02-25 ENCOUNTER — Other Ambulatory Visit: Payer: Self-pay

## 2024-02-25 ENCOUNTER — Other Ambulatory Visit (HOSPITAL_COMMUNITY): Payer: Self-pay

## 2024-03-09 DIAGNOSIS — Z13820 Encounter for screening for osteoporosis: Secondary | ICD-10-CM | POA: Diagnosis not present

## 2024-03-11 ENCOUNTER — Other Ambulatory Visit (HOSPITAL_COMMUNITY)
Admission: RE | Admit: 2024-03-11 | Discharge: 2024-03-11 | Disposition: A | Discharge status: Home / Self Care | Source: Ambulatory Visit | Attending: Nephrology | Admitting: Nephrology

## 2024-03-11 DIAGNOSIS — N182 Chronic kidney disease, stage 2 (mild): Secondary | ICD-10-CM | POA: Diagnosis not present

## 2024-03-11 DIAGNOSIS — R809 Proteinuria, unspecified: Secondary | ICD-10-CM | POA: Insufficient documentation

## 2024-03-11 DIAGNOSIS — I129 Hypertensive chronic kidney disease with stage 1 through stage 4 chronic kidney disease, or unspecified chronic kidney disease: Secondary | ICD-10-CM | POA: Diagnosis not present

## 2024-03-11 DIAGNOSIS — Z6837 Body mass index (BMI) 37.0-37.9, adult: Secondary | ICD-10-CM | POA: Insufficient documentation

## 2024-03-11 LAB — CBC
HCT: 39.2 % (ref 36.0–46.0)
Hemoglobin: 12.9 g/dL (ref 12.0–15.0)
MCH: 31.1 pg (ref 26.0–34.0)
MCHC: 32.9 g/dL (ref 30.0–36.0)
MCV: 94.5 fL (ref 80.0–100.0)
Platelets: 240 10*3/uL (ref 150–400)
RBC: 4.15 MIL/uL (ref 3.87–5.11)
RDW: 12.6 % (ref 11.5–15.5)
WBC: 7.7 10*3/uL (ref 4.0–10.5)
nRBC: 0 % (ref 0.0–0.2)

## 2024-03-11 LAB — RENAL FUNCTION PANEL
Albumin: 3.8 g/dL (ref 3.5–5.0)
Anion gap: 8 (ref 5–15)
BUN: 12 mg/dL (ref 6–20)
CO2: 26 mmol/L (ref 22–32)
Calcium: 8.7 mg/dL — ABNORMAL LOW (ref 8.9–10.3)
Chloride: 102 mmol/L (ref 98–111)
Creatinine, Ser: 0.59 mg/dL (ref 0.44–1.00)
GFR, Estimated: >60 mL/min (ref >60)
Glucose, Bld: 97 mg/dL (ref 70–99)
Phosphorus: 3.1 mg/dL (ref 2.5–4.6)
Potassium: 4.2 mmol/L (ref 3.5–5.1)
Sodium: 136 mmol/L (ref 135–145)

## 2024-03-11 LAB — PROTEIN / CREATININE RATIO, URINE
Creatinine, Urine: 92 mg/dL
Total Protein, Urine: <6 mg/dL

## 2024-03-12 LAB — MICROALBUMIN / CREATININE URINE RATIO
Creatinine, Urine: 77.8 mg/dL
Microalb Creat Ratio: <4 mg/g{creat} (ref 0–29)
Microalb, Ur: <3 ug/mL — ABNORMAL HIGH

## 2024-03-17 ENCOUNTER — Other Ambulatory Visit (HOSPITAL_COMMUNITY): Payer: Self-pay

## 2024-03-17 DIAGNOSIS — N959 Unspecified menopausal and perimenopausal disorder: Secondary | ICD-10-CM | POA: Diagnosis not present

## 2024-03-17 DIAGNOSIS — M170 Bilateral primary osteoarthritis of knee: Secondary | ICD-10-CM | POA: Diagnosis not present

## 2024-03-17 DIAGNOSIS — Z1272 Encounter for screening for malignant neoplasm of vagina: Secondary | ICD-10-CM | POA: Diagnosis not present

## 2024-03-17 DIAGNOSIS — Z01419 Encounter for gynecological examination (general) (routine) without abnormal findings: Secondary | ICD-10-CM | POA: Diagnosis not present

## 2024-03-17 DIAGNOSIS — Z6839 Body mass index (BMI) 39.0-39.9, adult: Secondary | ICD-10-CM | POA: Diagnosis not present

## 2024-03-17 MED ORDER — ESTRADIOL 0.1 MG/24HR TD PTTW
1.0000 | MEDICATED_PATCH | TRANSDERMAL | 12 refills | Status: AC
  Filled 2024-03-17 – 2024-04-12 (×3): qty 24, 84d supply, fill #0
  Filled 2024-07-09: qty 24, 84d supply, fill #1
  Filled 2024-09-22: qty 24, 84d supply, fill #2
  Filled 2024-12-01 – 2024-12-10 (×2): qty 24, 84d supply, fill #3

## 2024-03-18 ENCOUNTER — Other Ambulatory Visit (HOSPITAL_COMMUNITY): Payer: Self-pay

## 2024-03-20 ENCOUNTER — Other Ambulatory Visit (HOSPITAL_COMMUNITY): Payer: Self-pay

## 2024-03-20 ENCOUNTER — Other Ambulatory Visit: Payer: Self-pay

## 2024-03-23 DIAGNOSIS — R809 Proteinuria, unspecified: Secondary | ICD-10-CM | POA: Diagnosis not present

## 2024-03-23 DIAGNOSIS — N182 Chronic kidney disease, stage 2 (mild): Secondary | ICD-10-CM | POA: Diagnosis not present

## 2024-03-23 DIAGNOSIS — Z6837 Body mass index (BMI) 37.0-37.9, adult: Secondary | ICD-10-CM | POA: Diagnosis not present

## 2024-03-23 DIAGNOSIS — K760 Fatty (change of) liver, not elsewhere classified: Secondary | ICD-10-CM | POA: Diagnosis not present

## 2024-04-12 ENCOUNTER — Other Ambulatory Visit: Payer: Self-pay | Admitting: Internal Medicine

## 2024-04-12 DIAGNOSIS — I10 Essential (primary) hypertension: Secondary | ICD-10-CM

## 2024-04-13 ENCOUNTER — Other Ambulatory Visit: Payer: Self-pay

## 2024-04-13 ENCOUNTER — Other Ambulatory Visit (HOSPITAL_COMMUNITY): Payer: Self-pay

## 2024-04-13 MED ORDER — AMLODIPINE BESYLATE 5 MG PO TABS
5.0000 mg | ORAL_TABLET | Freq: Every day | ORAL | 1 refills | Status: AC
Start: 2024-04-13 — End: 2024-10-22
  Filled 2024-04-13: qty 90, 90d supply, fill #0
  Filled 2024-07-24: qty 90, 90d supply, fill #1

## 2024-04-28 ENCOUNTER — Other Ambulatory Visit (HOSPITAL_COMMUNITY): Payer: Self-pay

## 2024-06-22 ENCOUNTER — Other Ambulatory Visit (HOSPITAL_COMMUNITY): Payer: Self-pay

## 2024-06-22 ENCOUNTER — Other Ambulatory Visit (HOSPITAL_BASED_OUTPATIENT_CLINIC_OR_DEPARTMENT_OTHER): Payer: Self-pay

## 2024-07-09 ENCOUNTER — Other Ambulatory Visit (HOSPITAL_COMMUNITY): Payer: Self-pay

## 2024-07-24 ENCOUNTER — Other Ambulatory Visit (HOSPITAL_COMMUNITY): Payer: Self-pay

## 2024-08-20 ENCOUNTER — Other Ambulatory Visit (HOSPITAL_COMMUNITY): Payer: Self-pay

## 2024-08-22 ENCOUNTER — Other Ambulatory Visit (HOSPITAL_COMMUNITY): Payer: Self-pay

## 2024-08-22 DIAGNOSIS — M25562 Pain in left knee: Secondary | ICD-10-CM | POA: Diagnosis not present

## 2024-08-22 DIAGNOSIS — G8929 Other chronic pain: Secondary | ICD-10-CM | POA: Diagnosis not present

## 2024-08-22 DIAGNOSIS — M25561 Pain in right knee: Secondary | ICD-10-CM | POA: Diagnosis not present

## 2024-08-22 DIAGNOSIS — M25511 Pain in right shoulder: Secondary | ICD-10-CM | POA: Diagnosis not present

## 2024-08-22 MED ORDER — PREDNISONE 5 MG (21) PO TBPK
ORAL_TABLET | ORAL | 0 refills | Status: AC
  Filled 2024-08-22: qty 21, 6d supply, fill #0

## 2024-08-22 MED ORDER — TELMISARTAN 20 MG PO TABS
10.0000 mg | ORAL_TABLET | Freq: Every day | ORAL | 0 refills | Status: AC
  Filled 2024-08-22: qty 30, 60d supply, fill #0
  Filled 2024-10-26: qty 30, 60d supply, fill #1

## 2024-08-24 ENCOUNTER — Other Ambulatory Visit: Payer: Self-pay

## 2024-08-24 ENCOUNTER — Other Ambulatory Visit (HOSPITAL_COMMUNITY): Payer: Self-pay

## 2024-09-15 DIAGNOSIS — M170 Bilateral primary osteoarthritis of knee: Secondary | ICD-10-CM | POA: Diagnosis not present

## 2024-09-22 ENCOUNTER — Other Ambulatory Visit (HOSPITAL_COMMUNITY): Payer: Self-pay

## 2024-10-26 ENCOUNTER — Other Ambulatory Visit: Payer: Self-pay | Admitting: Internal Medicine

## 2024-10-26 ENCOUNTER — Other Ambulatory Visit (HOSPITAL_COMMUNITY): Payer: Self-pay

## 2024-10-26 ENCOUNTER — Other Ambulatory Visit: Payer: Self-pay

## 2024-10-26 DIAGNOSIS — I10 Essential (primary) hypertension: Secondary | ICD-10-CM

## 2024-10-26 MED ORDER — AMLODIPINE BESYLATE 5 MG PO TABS
5.0000 mg | ORAL_TABLET | Freq: Every day | ORAL | 1 refills | Status: AC
  Filled 2024-10-26: qty 90, 90d supply, fill #0

## 2024-12-01 ENCOUNTER — Other Ambulatory Visit: Payer: Self-pay | Admitting: Obstetrics and Gynecology

## 2024-12-01 ENCOUNTER — Other Ambulatory Visit: Payer: Self-pay

## 2024-12-01 DIAGNOSIS — Z1231 Encounter for screening mammogram for malignant neoplasm of breast: Secondary | ICD-10-CM

## 2024-12-10 ENCOUNTER — Other Ambulatory Visit: Payer: Self-pay

## 2024-12-17 NOTE — Telephone Encounter (Unsigned)
 Copied from CRM [id number]. Topic: Appointments - Appointment Cancel/Reschedule >> Dec 17, 2024 11:10 AM Donna BRAVO wrote: Patient/patient representative is calling to cancel or reschedule an appointment. Refer to attachments for appointment information.   Rescheduling per provider  unable to reschedule appt. First available June  ----------------------------------------------------------------------- From previous Reason for Contact - Scheduling: Patient/patient representative is calling to schedule an appointment. Refer to attachments for appointment information.

## 2025-01-06 ENCOUNTER — Ambulatory Visit: Payer: Self-pay

## 2025-01-14 ENCOUNTER — Ambulatory Visit
